# Patient Record
Sex: Male | Born: 1945 | Race: White | Hispanic: No | Marital: Married | Smoking: Never smoker
Health system: Southern US, Community
[De-identification: ages and names within clinical notes are randomized; demographics above are authoritative.]

## PROBLEM LIST (undated history)

## (undated) DIAGNOSIS — K644 Residual hemorrhoidal skin tags: Secondary | ICD-10-CM

## (undated) DIAGNOSIS — R194 Change in bowel habit: Secondary | ICD-10-CM

## (undated) DIAGNOSIS — M1990 Unspecified osteoarthritis, unspecified site: Secondary | ICD-10-CM

## (undated) DIAGNOSIS — K5730 Diverticulosis of large intestine without perforation or abscess without bleeding: Secondary | ICD-10-CM

## (undated) DIAGNOSIS — R011 Cardiac murmur, unspecified: Secondary | ICD-10-CM

## (undated) DIAGNOSIS — K219 Gastro-esophageal reflux disease without esophagitis: Secondary | ICD-10-CM

## (undated) DIAGNOSIS — J189 Pneumonia, unspecified organism: Secondary | ICD-10-CM

## (undated) DIAGNOSIS — R7303 Prediabetes: Secondary | ICD-10-CM

## (undated) DIAGNOSIS — I10 Essential (primary) hypertension: Secondary | ICD-10-CM

## (undated) DIAGNOSIS — N400 Enlarged prostate without lower urinary tract symptoms: Secondary | ICD-10-CM

## (undated) DIAGNOSIS — E785 Hyperlipidemia, unspecified: Secondary | ICD-10-CM

## (undated) DIAGNOSIS — C4490 Unspecified malignant neoplasm of skin, unspecified: Secondary | ICD-10-CM

## (undated) DIAGNOSIS — G20A1 Parkinson's disease without dyskinesia, without mention of fluctuations: Secondary | ICD-10-CM

## (undated) DIAGNOSIS — T8859XA Other complications of anesthesia, initial encounter: Secondary | ICD-10-CM

## (undated) DIAGNOSIS — Z87898 Personal history of other specified conditions: Secondary | ICD-10-CM

## (undated) HISTORY — DX: Unspecified malignant neoplasm of skin, unspecified: C44.90

## (undated) HISTORY — DX: Gastro-esophageal reflux disease without esophagitis: K21.9

## (undated) HISTORY — DX: Hyperlipidemia, unspecified: E78.5

## (undated) HISTORY — DX: Unspecified osteoarthritis, unspecified site: M19.90

## (undated) HISTORY — DX: Residual hemorrhoidal skin tags: K64.4

## (undated) HISTORY — DX: Personal history of other specified conditions: Z87.898

## (undated) HISTORY — PX: WISDOM TOOTH EXTRACTION: SHX21

## (undated) HISTORY — DX: Change in bowel habit: R19.4

## (undated) HISTORY — DX: Cardiac murmur, unspecified: R01.1

## (undated) HISTORY — PX: TONSILLECTOMY: SUR1361

## (undated) HISTORY — DX: Gilbert syndrome: E80.4

## (undated) HISTORY — DX: Diverticulosis of large intestine without perforation or abscess without bleeding: K57.30

## (undated) HISTORY — DX: Essential (primary) hypertension: I10

## (undated) HISTORY — DX: Benign prostatic hyperplasia without lower urinary tract symptoms: N40.0

---
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
## 1990-01-09 HISTORY — PX: LUMBAR LAMINECTOMY: SHX95

## 1998-10-09 ENCOUNTER — Encounter: Payer: Self-pay | Admitting: Internal Medicine

## 1998-10-09 ENCOUNTER — Ambulatory Visit (HOSPITAL_COMMUNITY): Admission: RE | Admit: 1998-10-09 | Discharge: 1998-10-09 | Payer: Self-pay | Admitting: Internal Medicine

## 1998-10-19 ENCOUNTER — Encounter: Payer: Self-pay | Admitting: Internal Medicine

## 1998-10-19 ENCOUNTER — Ambulatory Visit (HOSPITAL_COMMUNITY): Admission: RE | Admit: 1998-10-19 | Discharge: 1998-10-19 | Payer: Self-pay | Admitting: Internal Medicine

## 2004-01-10 HISTORY — PX: INGUINAL HERNIA REPAIR: SUR1180

## 2004-07-05 ENCOUNTER — Ambulatory Visit: Payer: Self-pay | Admitting: Internal Medicine

## 2004-07-08 ENCOUNTER — Ambulatory Visit: Payer: Self-pay | Admitting: Internal Medicine

## 2004-10-03 ENCOUNTER — Ambulatory Visit: Payer: Self-pay | Admitting: Internal Medicine

## 2004-12-29 ENCOUNTER — Ambulatory Visit: Payer: Self-pay | Admitting: Internal Medicine

## 2005-01-04 ENCOUNTER — Encounter: Payer: Self-pay | Admitting: Internal Medicine

## 2005-01-04 ENCOUNTER — Ambulatory Visit: Payer: Self-pay

## 2006-01-09 DIAGNOSIS — K5730 Diverticulosis of large intestine without perforation or abscess without bleeding: Secondary | ICD-10-CM

## 2006-01-09 HISTORY — PX: COLONOSCOPY: SHX174

## 2006-01-09 HISTORY — DX: Diverticulosis of large intestine without perforation or abscess without bleeding: K57.30

## 2006-07-03 ENCOUNTER — Ambulatory Visit: Payer: Self-pay | Admitting: Gastroenterology

## 2006-07-27 ENCOUNTER — Encounter: Payer: Self-pay | Admitting: Internal Medicine

## 2006-07-27 ENCOUNTER — Ambulatory Visit: Payer: Self-pay | Admitting: Gastroenterology

## 2006-07-27 DIAGNOSIS — K5730 Diverticulosis of large intestine without perforation or abscess without bleeding: Secondary | ICD-10-CM | POA: Insufficient documentation

## 2006-07-27 LAB — HM COLONOSCOPY

## 2006-08-13 ENCOUNTER — Ambulatory Visit: Payer: Self-pay | Admitting: Internal Medicine

## 2006-08-13 DIAGNOSIS — E785 Hyperlipidemia, unspecified: Secondary | ICD-10-CM | POA: Insufficient documentation

## 2006-08-21 ENCOUNTER — Encounter (INDEPENDENT_AMBULATORY_CARE_PROVIDER_SITE_OTHER): Payer: Self-pay | Admitting: *Deleted

## 2007-01-14 ENCOUNTER — Encounter: Payer: Self-pay | Admitting: Internal Medicine

## 2007-06-07 ENCOUNTER — Encounter: Payer: Self-pay | Admitting: Internal Medicine

## 2007-06-20 ENCOUNTER — Ambulatory Visit: Payer: Self-pay | Admitting: Internal Medicine

## 2007-07-05 ENCOUNTER — Ambulatory Visit: Payer: Self-pay | Admitting: Internal Medicine

## 2007-08-09 ENCOUNTER — Encounter: Payer: Self-pay | Admitting: Internal Medicine

## 2007-09-13 ENCOUNTER — Encounter: Payer: Self-pay | Admitting: Internal Medicine

## 2007-10-08 ENCOUNTER — Encounter: Payer: Self-pay | Admitting: Internal Medicine

## 2008-02-06 ENCOUNTER — Telehealth (INDEPENDENT_AMBULATORY_CARE_PROVIDER_SITE_OTHER): Payer: Self-pay | Admitting: *Deleted

## 2008-03-12 ENCOUNTER — Ambulatory Visit: Payer: Self-pay | Admitting: Internal Medicine

## 2008-04-29 ENCOUNTER — Encounter: Payer: Self-pay | Admitting: Internal Medicine

## 2008-05-29 ENCOUNTER — Encounter: Payer: Self-pay | Admitting: Internal Medicine

## 2008-06-15 ENCOUNTER — Telehealth (INDEPENDENT_AMBULATORY_CARE_PROVIDER_SITE_OTHER): Payer: Self-pay | Admitting: *Deleted

## 2008-12-02 ENCOUNTER — Ambulatory Visit: Payer: Self-pay | Admitting: Internal Medicine

## 2008-12-10 ENCOUNTER — Telehealth (INDEPENDENT_AMBULATORY_CARE_PROVIDER_SITE_OTHER): Payer: Self-pay | Admitting: *Deleted

## 2009-01-26 ENCOUNTER — Ambulatory Visit: Payer: Self-pay | Admitting: Internal Medicine

## 2009-01-26 DIAGNOSIS — R9431 Abnormal electrocardiogram [ECG] [EKG]: Secondary | ICD-10-CM | POA: Insufficient documentation

## 2009-01-26 DIAGNOSIS — Z85828 Personal history of other malignant neoplasm of skin: Secondary | ICD-10-CM | POA: Insufficient documentation

## 2009-01-26 DIAGNOSIS — E349 Endocrine disorder, unspecified: Secondary | ICD-10-CM | POA: Insufficient documentation

## 2009-01-26 DIAGNOSIS — Z8679 Personal history of other diseases of the circulatory system: Secondary | ICD-10-CM | POA: Insufficient documentation

## 2009-01-26 DIAGNOSIS — I10 Essential (primary) hypertension: Secondary | ICD-10-CM | POA: Insufficient documentation

## 2009-03-12 ENCOUNTER — Ambulatory Visit: Payer: Self-pay | Admitting: Internal Medicine

## 2009-06-29 ENCOUNTER — Telehealth (INDEPENDENT_AMBULATORY_CARE_PROVIDER_SITE_OTHER): Payer: Self-pay | Admitting: *Deleted

## 2009-08-25 ENCOUNTER — Encounter: Payer: Self-pay | Admitting: Internal Medicine

## 2010-02-06 LAB — CONVERTED CEMR LAB
ALT: 19 units/L (ref 0–53)
ALT: 21 units/L (ref 0–53)
AST: 25 units/L (ref 0–37)
AST: 28 units/L (ref 0–37)
Albumin: 4.3 g/dL (ref 3.5–5.2)
Albumin: 4.4 g/dL (ref 3.5–5.2)
Alkaline Phosphatase: 47 units/L (ref 39–117)
Alkaline Phosphatase: 56 units/L (ref 39–117)
BUN: 15 mg/dL (ref 6–23)
BUN: 20 mg/dL (ref 6–23)
Basophils Absolute: 0 10*3/uL (ref 0.0–0.1)
Basophils Absolute: 0 10*3/uL (ref 0.0–0.1)
Basophils Relative: 0.1 % (ref 0.0–3.0)
Basophils Relative: 0.7 % (ref 0.0–1.0)
Bilirubin, Direct: 0.1 mg/dL (ref 0.0–0.3)
Bilirubin, Direct: 0.2 mg/dL (ref 0.0–0.3)
CO2: 30 meq/L (ref 19–32)
CO2: 35 meq/L — ABNORMAL HIGH (ref 19–32)
Calcium: 9 mg/dL (ref 8.4–10.5)
Calcium: 9.1 mg/dL (ref 8.4–10.5)
Chloride: 103 meq/L (ref 96–112)
Chloride: 104 meq/L (ref 96–112)
Cholesterol: 164 mg/dL (ref 0–200)
Creatinine, Ser: 0.9 mg/dL (ref 0.4–1.5)
Creatinine, Ser: 1 mg/dL (ref 0.4–1.5)
Eosinophils Absolute: 0 10*3/uL (ref 0.0–0.7)
Eosinophils Absolute: 0.1 10*3/uL (ref 0.0–0.6)
Eosinophils Relative: 0.8 % (ref 0.0–5.0)
Eosinophils Relative: 3.1 % (ref 0.0–5.0)
GFR calc Af Amer: 110 mL/min
GFR calc non Af Amer: 79.69 mL/min (ref >60)
GFR calc non Af Amer: 91 mL/min
Glucose, Bld: 92 mg/dL (ref 70–99)
Glucose, Bld: 93 mg/dL (ref 70–99)
HCT: 40.1 % (ref 39.0–52.0)
HCT: 41.4 % (ref 39.0–52.0)
HDL: 44.3 mg/dL (ref >39.00)
Hemoglobin: 13.9 g/dL (ref 13.0–17.0)
Hemoglobin: 14.1 g/dL (ref 13.0–17.0)
LDL Cholesterol: 104 mg/dL — ABNORMAL HIGH (ref 0–99)
Lymphocytes Relative: 31.9 % (ref 12.0–46.0)
Lymphocytes Relative: 32.5 % (ref 12.0–46.0)
Lymphs Abs: 1.7 10*3/uL (ref 0.7–4.0)
MCHC: 33.6 g/dL (ref 30.0–36.0)
MCHC: 35.1 g/dL (ref 30.0–36.0)
MCV: 93 fL (ref 78.0–100.0)
MCV: 95.5 fL (ref 78.0–100.0)
Monocytes Absolute: 0.3 10*3/uL (ref 0.2–0.7)
Monocytes Absolute: 0.4 10*3/uL (ref 0.1–1.0)
Monocytes Relative: 6.4 % (ref 3.0–11.0)
Monocytes Relative: 6.9 % (ref 3.0–12.0)
Neutro Abs: 2.8 10*3/uL (ref 1.4–7.7)
Neutro Abs: 3.2 10*3/uL (ref 1.4–7.7)
Neutrophils Relative %: 57.3 % (ref 43.0–77.0)
Neutrophils Relative %: 60.3 % (ref 43.0–77.0)
Platelets: 221 10*3/uL (ref 150.0–400.0)
Platelets: 236 10*3/uL (ref 150–400)
Potassium: 4.2 meq/L (ref 3.5–5.1)
Potassium: 4.5 meq/L (ref 3.5–5.1)
RBC: 4.31 M/uL (ref 4.22–5.81)
RBC: 4.34 M/uL (ref 4.22–5.81)
RDW: 12.1 % (ref 11.5–14.6)
RDW: 12.3 % (ref 11.5–14.6)
Sodium: 141 meq/L (ref 135–145)
Sodium: 143 meq/L (ref 135–145)
TSH: 3.51 microintl units/mL (ref 0.35–5.50)
TSH: 4.16 microintl units/mL (ref 0.35–5.50)
Total Bilirubin: 1.6 mg/dL — ABNORMAL HIGH (ref 0.3–1.2)
Total Bilirubin: 1.9 mg/dL — ABNORMAL HIGH (ref 0.3–1.2)
Total CHOL/HDL Ratio: 4
Total Protein: 7.1 g/dL (ref 6.0–8.3)
Total Protein: 7.5 g/dL (ref 6.0–8.3)
Triglycerides: 77 mg/dL (ref 0.0–149.0)
VLDL: 15.4 mg/dL (ref 0.0–40.0)
WBC: 4.8 10*3/uL (ref 4.5–10.5)
WBC: 5.3 10*3/uL (ref 4.5–10.5)

## 2010-02-08 NOTE — Letter (Signed)
Summary: [facility]  [facility]   Imported By: Lanelle Bal 09/06/2009 12:49:35  _____________________________________________________________________  External Attachment:    Type:   Image     Comment:   External Document

## 2010-02-08 NOTE — Miscellaneous (Signed)
Summary: Directives and Consents  Directives and Consents   Imported By: Freddy Jaksch 08/14/2006 09:15:54  _____________________________________________________________________  External Attachment:    Type:   Image     Comment:   DIRECTIVES AND CONSENT

## 2010-02-08 NOTE — Assessment & Plan Note (Signed)
Summary: CPX/NS/KDC   Vital Signs:  Patient profile:   65 year old male Height:      70.75 inches Weight:      188 pounds BMI:     26.50 Temp:     98.4 degrees F oral Pulse rate:   67 / minute Resp:     14 per minute BP sitting:   120 / 82  (left arm) Cuff size:   large  Vitals Entered By: Shonna Chock (January 26, 2009 8:14 AM)  Comments REVIEWED MED LIST, PATIENT AGREED DOSE AND INSTRUCTION CORRECT    History of Present Illness: Douglas Johnston is here for a physical; he is asymptomatic. He received flu shot today.  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 melanoma R shoulder 1996, [place]; now seeing Dr Donzetta Starch  Past Surgical History: Lumbar laminectomy @  L4-5 Tonsillectomy ? ASD surgery  age 66 Inguinal herniorrhaphy Colonoscopy 2009, due 2019  Family History: Father: emphysema, smoker Mother: HTN , SBO Siblings: negative  Social History: Occupation:VP Firefighter Married Never Smoked Alcohol use-yes: socially Regular exercise-yes: CVE 60 min / week & weights  Review of Systems  The patient denies anorexia, fever, weight loss, weight gain, vision loss, decreased hearing, hoarseness, chest pain, syncope, dyspnea on exertion, peripheral edema, prolonged cough, headaches, hemoptysis, abdominal pain, melena, hematochezia, severe indigestion/heartburn, hematuria, incontinence, suspicious skin lesions, depression, unusual weight change, abnormal bleeding, enlarged lymph nodes, and  angioedema.    Physical Exam  General:  Well-developed,well-nourished,in no acute distress; alert,appropriate and cooperative throughout examination Head:  Normocephalic and atraumatic without obvious abnormalities. Eyes:  No corneal or conjunctival inflammation noted.  Perrla. Funduscopic exam benign, without hemorrhages, exudates or papilledema.  Ears:  External ear exam shows no significant lesions or deformities.  Otoscopic examination reveals clear canals, tympanic membranes are intact bilaterally without bulging, retraction, inflammation or discharge. Hearing is grossly normal bilaterally. Nose:  External nasal examination shows no deformity or inflammation. Nasal mucosa are pink and moist without lesions or exudates. Mouth:  Oral mucosa and oropharynx without lesions or exudates.  Teeth in good repair. Neck:  No deformities, masses, or tenderness noted.Minimal throid asymmetry Chest Wall:  OP scar inframammary area bilaterally Lungs:  Normal respiratory effort, chest expands symmetrically. Lungs are clear to auscultation, no crackles or wheezes. Heart:  regular rhythm, no murmur, no gallop, no rub, no JVD, no HJR, and bradycardia.   Abdomen:  Bowel sounds positive,abdomen soft and non-tender without masses, organomegaly or hernias noted. Genitalia:  Dr Annabell Howells Prostate:  Dr Annabell Howells Msk:  No deformity or scoliosis noted of thoracic or lumbar spine.   Pulses:  R and L carotid,radial,dorsalis pedis and posterior tibial pulses are full and equal bilaterally Extremities:  No clubbing, cyanosis, edema, or deformity noted with normal full range of motion of all joints.   Neurologic:  alert & oriented X3 and DTRs symmetrical and 0-1/2+ Skin:  Op scar R post shoulder Cervical Nodes:  No lymphadenopathy noted Axillary Nodes:  No palpable lymphadenopathy Psych:  memory intact for recent and remote, normally interactive, and good eye contact.  Impression & Recommendations:  Problem # 1:   ROUTINE GENERAL MEDICAL EXAM@HEALTH  CARE FACL (ICD-V70.0)  Orders: EKG w/ Interpretation (93000) Venipuncture (16109) TLB-Lipid Panel (80061-LIPID) TLB-BMP (Basic Metabolic Panel-BMET) (80048-METABOL) TLB-CBC Platelet - w/Differential (85025-CBCD) TLB-Hepatic/Liver Function Pnl (80076-HEPATIC) TLB-TSH (Thyroid Stimulating Hormone) (60454-UJW) Radiology Referral (Radiology)  Problem # 2:  HYPERTENSION (ICD-401.9)  controlled His updated medication list for this problem includes:    Hydrochlorothiazide 25 Mg Tabs (Hydrochlorothiazide) .Marland Kitchen... 1/2 by mouth qd  Orders: Venipuncture (11914)  Problem # 3:  HYPERLIPIDEMIA NEC/NOS (ICD-272.4)  Orders: Venipuncture (78295) TLB-Lipid Panel (80061-LIPID)  Problem # 4:  TESTICULAR HYPOFUNCTION (ICD-257.2)  as per Dr Annabell Howells  Orders: Venipuncture [phone]) Radiology Referral (Radiology)  Problem # 5:  SKIN CANCER, HX OF (ICD-V10.83) ? PMH of melanoma; as per Dr Donzetta Starch  Problem # 6:  ELECTROCARDIOGRAM, ABNORMAL (ICD-794.31)  Problem # 7:  ATRIAL SEPTAL DEFECT, HX OF (ICD-V12.59)  Orders: EKG w/ Interpretation (93000)  Complete Medication List: 1)  Hydrochlorothiazide 25 Mg Tabs (Hydrochlorothiazide) .... 1/2 by mouth qd 2)  Androgel 25 Mg/2.5gm Gel (Testosterone) 3)  Multivitamins Tabs (Multiple vitamin) .Marland Kitchen.. 1 by mouth once daily  Patient Instructions: 1)  Your LDL goal = < 100.  Appended Document: CPX/NS/KDC

## 2010-02-08 NOTE — Progress Notes (Signed)
Summary: REFILL REQUEST  Phone Note Refill Request Call back at [phone] Message from:  Pharmacy on June 29, 2009 8:21 AM  Refills Requested: Medication #1:  HYDROCHLOROTHIAZIDE 25 MG  TABS 1/2 by mouth qd   Dosage confirmed as above?Dosage Confirmed   Supply Requested: 1 month   Last Refilled: 05/24/2009 [facility]  Next Appointment Scheduled: NONE Initial call taken by: Lavell Islam,  June 29, 2009 8:21 AM    Prescriptions: HYDROCHLOROTHIAZIDE 25 MG  TABS (HYDROCHLOROTHIAZIDE) 1/2 by mouth qd  #15 Each x 5   Entered by:   Shonna Chock   Authorized by:   Marga Melnick MD   Signed by:   Shonna Chock on 06/29/2009   Method used:   Electronically to        [facility]* (retail)       [address]       [place], [place]  [id number]       Ph: [phone]       Fax: [phone]   RxID:   [id number]

## 2010-02-08 NOTE — Consult Note (Signed)
Summary: [facility]  [facility]   Imported By: Lanelle Bal 08/19/2007 10:59:28  _____________________________________________________________________  External Attachment:    Type:   Image     Comment:   External Document

## 2010-02-08 NOTE — Progress Notes (Signed)
Summary: [facility]  Phone Note Refill Request   Refills Requested: Medication #1:  HYDROCHLOROTHIAZIDE 25 MG  TABS 1/2 by mouth qd [facility] --[phone] filled--1.7.2010  Initial call taken by: Freddy Jaksch,  February 06, 2008 8:36 AM      Prescriptions: HYDROCHLOROTHIAZIDE 25 MG  TABS (HYDROCHLOROTHIAZIDE) 1/2 by mouth qd  #45 x 0   Entered by:   Shary Decamp   Authorized by:   Marga Melnick MD   Signed by:   Shary Decamp on 02/06/2008   Method used:   Electronically to        [facility]* (retail)       [address]       [place], [place]  [id number]       Ph: [phone]       Fax: [phone]   RxID:   [id number]

## 2010-02-08 NOTE — Assessment & Plan Note (Signed)
Summary: cough,fatigue,cbs   Vital Signs:  Patient Profile:   65 Years Old Male Weight:      187.6 pounds O2 Sat:      95 % O2 treatment:    Room Air Temp:     07.5 degrees F oral Pulse rate:   70 / minute Resp:     16 per minute BP sitting:   120 / 80  (left arm) Cuff size:   large  Vitals Entered By: Shonna Chock (March 12, 2008 11:01 AM)                 Chief Complaint:  HEAD COLD: COUGH and CONGESTION AND FATIGUE. ONSET: MONDAY.  History of Present Illness: Onset 03/09/2008 as dry nose  followed by itchy eyes & rhinitis. Rx: Claritin w/o benefit    Current Allergies (reviewed today): No known allergies      Review of Systems  General      Denies chills, fever, and sweats.  ENT      Complains of nasal congestion and sinus pressure.      No frontal headache ,facial pain or purulence  Resp      Denies cough and sputum productive.  Allergy      Complains of itching eyes and sneezing.   Physical Exam  General:     well-nourished,in no acute distress; alert,appropriate and cooperative throughout examination Ears:     External ear exam shows no significant lesions or deformities.  Otoscopic examination reveals clear canals, tympanic membranes are intact bilaterally without bulging, retraction, inflammation or discharge. Hearing is grossly normal bilaterally. Nose:     External nasal examination shows no deformity or inflammation. Nasal mucosa are pink and moist without lesions or exudates. Mouth:     Oral mucosa and oropharynx without lesions or exudates.  Teeth in good repair. Minimal pharyngeal erythema.   Lungs:     Normal respiratory effort, chest expands symmetrically. Lungs are clear to auscultation, no crackles or wheezes. Cervical Nodes:     No lymphadenopathy noted Axillary Nodes:     No palpable lymphadenopathy    Impression & Recommendations:  Problem # 1:  RHINITIS (ICD-477.9)  His updated medication list for this problem includes:    Nasonex 50 Mcg/act Susp (Mometasone furoate) .Marland Kitchen... 1 spray each nostril two times a day    Xyzal 5 Mg Tabs (Levocetirizine dihydrochloride) .Marland Kitchen... 1 at bedtime   Complete Medication List: 1)  Hydrochlorothiazide 25 Mg Tabs (Hydrochlorothiazide) .... 1/2 by mouth qd 2)  Vitamin C  3)  Vitamin E  4)  Omega 3 Fatty Acids  5)  B Complex  6)  Androgel 25 Mg/2.5gm Gel (Testosterone) 7)  Singulair 10 Mg Tabs (Montelukast sodium) .Marland Kitchen.. 1 once daily in am 8)  Nasonex 50 Mcg/act Susp (Mometasone furoate) .Marland Kitchen.. 1 spray each nostril two times a day 9)  Xyzal 5 Mg Tabs (Levocetirizine dihydrochloride) .Marland Kitchen.. 1 at bedtime   Patient Instructions: 1)  Neti pot once daily until sinuses clear. 2)  Drink as much fluid as you can tolerate for the next few days. Samples as directed; report "pain,pus & fever"   Prescriptions: XYZAL 5 MG TABS (LEVOCETIRIZINE DIHYDROCHLORIDE) 1 at bedtime  #7 x 0   Entered and Authorized by:   Marga Melnick MD   Signed by:   Marga Melnick MD on 03/12/2008   Method used:   Print then Give to Patient   RxID:   [id number] NASONEX 50 MCG/ACT SUSP (MOMETASONE FUROATE) 1 spray each  nostril two times a day  #1 x 5   Entered and Authorized by:   Marga Melnick MD   Signed by:   Marga Melnick MD on 03/12/2008   Method used:   Print then Give to Patient   RxID:   [id number] SINGULAIR 10 MG TABS (MONTELUKAST SODIUM) 1 once daily in am  #14 x 0   Entered and Authorized by:   Marga Melnick MD   Signed by:   Marga Melnick MD on 03/12/2008   Method used:   Print then Give to Patient   RxID:   [id number]

## 2010-02-08 NOTE — Miscellaneous (Signed)
Summary: BONE DENSITY  Clinical Lists Changes  Orders: Added new Test order of T-Bone Densitometry (77080) - Signed Added new Test order of T-Lumbar Vertebral Assessment (77082) - Signed 

## 2010-02-14 ENCOUNTER — Other Ambulatory Visit: Payer: Self-pay | Admitting: Internal Medicine

## 2010-02-14 ENCOUNTER — Encounter (INDEPENDENT_AMBULATORY_CARE_PROVIDER_SITE_OTHER): Payer: Self-pay | Admitting: Internal Medicine

## 2010-02-14 ENCOUNTER — Encounter: Payer: Self-pay | Admitting: Internal Medicine

## 2010-02-14 DIAGNOSIS — E785 Hyperlipidemia, unspecified: Secondary | ICD-10-CM

## 2010-02-14 DIAGNOSIS — Z0000 Encounter for general adult medical examination without abnormal findings: Secondary | ICD-10-CM

## 2010-02-14 DIAGNOSIS — Z23 Encounter for immunization: Secondary | ICD-10-CM

## 2010-02-14 DIAGNOSIS — Z8679 Personal history of other diseases of the circulatory system: Secondary | ICD-10-CM

## 2010-02-14 DIAGNOSIS — Z136 Encounter for screening for cardiovascular disorders: Secondary | ICD-10-CM

## 2010-02-14 DIAGNOSIS — I10 Essential (primary) hypertension: Secondary | ICD-10-CM

## 2010-02-14 DIAGNOSIS — R9431 Abnormal electrocardiogram [ECG] [EKG]: Secondary | ICD-10-CM

## 2010-02-14 LAB — HEPATIC FUNCTION PANEL
ALT: 27 U/L (ref 0–53)
AST: 28 U/L (ref 0–37)
Albumin: 4.4 g/dL (ref 3.5–5.2)
Alkaline Phosphatase: 60 U/L (ref 39–117)
Bilirubin, Direct: 0.2 mg/dL (ref 0.0–0.3)
Total Bilirubin: 1.1 mg/dL (ref 0.3–1.2)
Total Protein: 7.1 g/dL (ref 6.0–8.3)

## 2010-02-14 LAB — BASIC METABOLIC PANEL
BUN: 20 mg/dL (ref 6–23)
CO2: 29 mEq/L (ref 19–32)
Calcium: 8.8 mg/dL (ref 8.4–10.5)
Chloride: 102 mEq/L (ref 96–112)
Creatinine, Ser: 0.9 mg/dL (ref 0.4–1.5)
GFR: 85.31 mL/min (ref >60.00)
Glucose, Bld: 73 mg/dL (ref 70–99)
Potassium: 4.1 mEq/L (ref 3.5–5.1)
Sodium: 140 mEq/L (ref 135–145)

## 2010-02-14 LAB — CBC WITH DIFFERENTIAL/PLATELET
Basophils Absolute: 0 10*3/uL (ref 0.0–0.1)
Basophils Relative: 0.2 % (ref 0.0–3.0)
Eosinophils Absolute: 0 10*3/uL (ref 0.0–0.7)
Eosinophils Relative: 0.2 % (ref 0.0–5.0)
HCT: 40.3 % (ref 39.0–52.0)
Hemoglobin: 14 g/dL (ref 13.0–17.0)
Lymphocytes Relative: 13.9 % (ref 12.0–46.0)
Lymphs Abs: 1.3 10*3/uL (ref 0.7–4.0)
MCHC: 34.7 g/dL (ref 30.0–36.0)
MCV: 93.7 fl (ref 78.0–100.0)
Monocytes Absolute: 0.5 10*3/uL (ref 0.1–1.0)
Monocytes Relative: 5.5 % (ref 3.0–12.0)
Neutro Abs: 7.7 10*3/uL (ref 1.4–7.7)
Neutrophils Relative %: 80.2 % — ABNORMAL HIGH (ref 43.0–77.0)
Platelets: 249 10*3/uL (ref 150.0–400.0)
RBC: 4.3 Mil/uL (ref 4.22–5.81)
RDW: 12.9 % (ref 11.5–14.6)
WBC: 9.6 10*3/uL (ref 4.5–10.5)

## 2010-02-14 LAB — LIPID PANEL
Cholesterol: 150 mg/dL (ref 0–200)
HDL: 37.8 mg/dL — ABNORMAL LOW (ref >39.00)
LDL Cholesterol: 100 mg/dL — ABNORMAL HIGH (ref 0–99)
Total CHOL/HDL Ratio: 4
Triglycerides: 59 mg/dL (ref 0.0–149.0)
VLDL: 11.8 mg/dL (ref 0.0–40.0)

## 2010-02-14 LAB — CONVERTED CEMR LAB
Cholesterol, target level: 200 mg/dL
HDL goal, serum: 40 mg/dL
LDL Goal: 130 mg/dL

## 2010-02-14 LAB — TSH: TSH: 2.58 u[IU]/mL (ref 0.35–5.50)

## 2010-02-24 NOTE — Assessment & Plan Note (Signed)
Summary: cpx & lab/cbs   Vital Signs:  Patient profile:   65 year old male Height:      70.5 inches Weight:      188.4 pounds BMI:     26.75 Temp:     98.9 degrees F oral Pulse rate:   64 / minute Resp:     14 per minute BP sitting:   108 / 80  (left arm) Cuff size:   large  Vitals Entered By: Shonna Chock CMA (February 14, 2010 9:32 AM)  CC: CPX with fasting labs , General Medical Evaluation, Lipid Management  Vision Screening:Left eye with correction: 20 / 25 Right eye with correction: 20 / 25 Both eyes with correction: 20 / 25  Color vision testing: normal      Vision Entered By: Shonna Chock CMA (February 14, 2010 9:39 AM)   CC:  CPX with fasting labs , General Medical Evaluation, and Lipid Management.  History of Present Illness:    Douglas Johnston is here for a physical; he is asymptomatic. He is still employed . Hypertension Follow-Up: The patient denies lightheadedness, urinary frequency, headaches, edema, and fatigue.  The patient denies the following associated symptoms: chest pain, chest pressure, exercise intolerance, dyspnea, palpitations, and syncope.  Compliance with medications (by patient report) has been near 100%.  The patient reports that dietary compliance has been good.  Adjunctive measures currently used by the patient include salt restriction. BP @ home 130/80-90.   Lipid Management History:      Positive NCEP/ATP III risk factors include male age 63 years old or older and hypertension.  Negative NCEP/ATP III risk factors include non-diabetic, no family history for ischemic heart disease, non-tobacco-user status, no ASHD (atherosclerotic heart disease), no prior stroke/TIA, no peripheral vascular disease, and no history of aortic aneurysm.     Current Medications (verified): 1)  Hydrochlorothiazide 25 Mg  Tabs (Hydrochlorothiazide) .... 1/2 By Mouth Qd 2)  Androgel 25 Mg/2.5gm  Gel (Testosterone) 3)  Multivitamins  Tabs (Multiple Vitamin) .Marland Kitchen.. 1 By  Mouth Once Daily 4)  Vitamin C 500 Mg Tabs (Ascorbic Acid) .Marland Kitchen.. 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 [place] Study LDL goal = < 130. Hypertension Skin cancer,PMH  of,Basal Cell X 3; ? melanoma R shoulder 1996, [place]; now seeing Dr Donzetta Starch Abnormal EKG ; neg Nucear Stress Test  12/2004  Past Surgical History: Lumbar laminectomy @  L4-5 Tonsillectomy ? ASD surgery  @  age 39 Inguinal herniorrhaphy Colonoscopy 2009, due 2019  Family History: Father: emphysema, smoker Mother: HTN , Small Bowel Obstruction Siblings: negative; no  FH CVA , MI or DM  Social History: Occupation:[employer] Married Never Smoked Alcohol use-yes: socially Regular exercise-yes: CVE  & weights 2.5 - 3 hrs/ week  Review of Systems  The patient denies anorexia, fever, weight loss, weight gain, vision loss, decreased hearing, hoarseness, prolonged cough, hemoptysis, abdominal pain, melena, hematochezia, severe indigestion/heartburn, suspicious skin lesions, depression, unusual weight change, abnormal bleeding, enlarged lymph nodes, and angioedema.         Dr Annabell Howells seen annually.  Physical Exam  General:  Well-developed,well-nourished;alert,appropriate and cooperative throughout examination Head:  Normocephalic and atraumatic without obvious abnormalities.  Eyes:  No corneal or conjunctival inflammation noted. Perrla. Funduscopic exam benign, without hemorrhages, exudates or papilledema. Ears:  External ear exam shows no significant lesions or  deformities.  Otoscopic examination reveals clear canals, tympanic membranes are intact bilaterally without bulging, retraction, inflammation or discharge. Hearing is grossly normal bilaterally. Some wax bilaterally Nose:  External nasal examination shows no deformity or inflammation. Nasal mucosa are pink and moist without lesions or exudates. Mouth:  Oral mucosa and oropharynx without lesions or exudates.  Teeth in good repair. Neck:  No deformities, masses, or tenderness noted. Lungs:  Normal respiratory effort, chest expands symmetrically. Lungs are clear to auscultation, no crackles or wheezes. Heart:  regular rhythm, no murmur, no gallop, no rub, no JVD, no HJR, and bradycardia.   Abdomen:  Bowel sounds positive,abdomen soft and non-tender without masses, organomegaly or hernias noted. Genitalia:  Dr Annabell Howells Msk:  No deformity or scoliosis noted of thoracic or lumbar spine but asymmetry of thoracic spine  .   Pulses:  R and L carotid,radial,dorsalis pedis and posterior tibial pulses are full and equal bilaterally Extremities:  No clubbing, cyanosis, edema, or deformity noted with normal full range of motion of all joints.   Neurologic:  alert & oriented X3, strength normal in all extremities, and DTRs symmetrical and normal.   Skin:  Intact without suspicious lesions or rashes Cervical Nodes:  No lymphadenopathy noted Axillary Nodes:  No palpable lymphadenopathy Psych:  memory intact for recent and remote, normally interactive, and good eye contact.     Impression & Recommendations:  Problem # 1:  PREVENTIVE HEALTH CARE (ICD-V70.0)  Orders: EKG w/ Interpretation (93000) Venipuncture (16109) TLB-Lipid Panel (80061-LIPID) TLB-BMP (Basic Metabolic Panel-BMET) (80048-METABOL) TLB-CBC Platelet - w/Differential (85025-CBCD) TLB-Hepatic/Liver Function Pnl (80076-HEPATIC) TLB-TSH (Thyroid Stimulating Hormone) (84443-TSH)  Problem # 2:  ELECTROCARDIOGRAM, ABNORMAL (ICD-794.31) serially  improved T voltage in V leads  Problem # 3:  HYPERTENSION (ICD-401.9) controlled His updated medication list for this problem includes:    Hydrochlorothiazide 25 Mg Tabs (Hydrochlorothiazide) .Marland Kitchen... 1/2 by mouth qd  Problem # 4:  ATRIAL SEPTAL DEFECT, HX OF (ICD-V12.59)  Problem # 5:  HYPERLIPIDEMIA NEC/NOS (ICD-272.4)  Complete Medication List: 1)  Hydrochlorothiazide 25 Mg Tabs (Hydrochlorothiazide) .... 1/2 by mouth qd 2)  Androgel 25 Mg/2.5gm Gel (Testosterone) 3)  Multivitamins Tabs (Multiple vitamin) .Marland Kitchen.. 1 by mouth once daily 4)  Vitamin C 500 Mg Tabs (Ascorbic acid) .Marland Kitchen.. 1 by mouth once daily  Other Orders: Tdap => 66yrs IM (60454) Admin 1st Vaccine (09811)  Lipid Assessment/Plan:      Based on NCEP/ATP III, the patient's risk factor category is "2 or more risk factors and a calculated 10 year CAD risk of < 20%".  The patient's lipid goals are as follows: Total cholesterol goal is 200; LDL cholesterol goal is 130; HDL cholesterol goal is 40; Triglyceride goal is 150.    Patient Instructions: 1)  It is important that you exercise regularly at least 20 minutes 5 times a week. If you develop chest pain, have severe difficulty breathing, or feel very tired , stop exercising immediately and seek medical attention. 2)  Take an 81 mg coated  Aspirin every day. 3)  Check your Blood Pressure regularly. If it is above: 135/85 ON AVERAGE you should make an appointment.   Orders Added: 1)  Tdap => 67yrs IM [90715] 2)  Admin 1st Vaccine [90471] 3)  Est. Patient 65& > [99397] 4)  EKG w/ Interpretation [93000] 5)  Venipuncture [36415] 6)  TLB-Lipid Panel [80061-LIPID] 7)  TLB-BMP (Basic Metabolic Panel-BMET) [80048-METABOL] 8)  TLB-CBC Platelet - w/Differential [85025-CBCD] 9)  TLB-Hepatic/Liver Function Pnl [80076-HEPATIC] 10)  TLB-TSH (Thyroid Stimulating Hormone) [30865-HQI]   Immunizations Administered:  Tetanus Vaccine:    Vaccine Type: Tdap    Site: right deltoid    Mfr:  GlaxoSmithKline    Dose: 0.5 ml    Route: IM    Given by: Shonna Chock CMA    Exp. Date: 10/29/2011    Lot #: [id number]    VIS given: 11/27/07 version given February 14, 2010.   Immunizations Administered:  Tetanus Vaccine:    Vaccine Type: Tdap    Site: right deltoid    Mfr: GlaxoSmithKline    Dose: 0.5 ml    Route: IM    Given by: Shonna Chock CMA    Exp. Date: 10/29/2011    Lot #: [id number]    VIS given: 11/27/07 version given February 14, 2010.    Family History:    Father: emphysema, smoker    Mother: HTN , Small Bowel Obstruction    Siblings: negative; no FH CVA , MI or DM  Social History:    Occupation:[employer]    Married    Never Smoked    Alcohol use-yes: socially    Regular exercise-yes: CVE  & weights 2.5 - 3 hrs/ week    Appended Document: cpx & lab/cbs

## 2010-04-04 ENCOUNTER — Other Ambulatory Visit: Payer: Self-pay | Admitting: Internal Medicine

## 2010-05-02 ENCOUNTER — Other Ambulatory Visit: Payer: Self-pay | Admitting: *Deleted

## 2010-05-02 MED ORDER — HYDROCHLOROTHIAZIDE 25 MG PO TABS: ORAL_TABLET | ORAL | Status: DC

## 2010-05-24 NOTE — Assessment & Plan Note (Signed)
[facility]                         GASTROENTEROLOGY OFFICE NOTE   IAM, LIPSON                    MRN:          [id number]  DATE:07/03/2006                            DOB:          Nov 20, 1945    Mr. Bracknell is a 65 year old white male who has had some rectal pain  over the last couple of weeks without rectal bleeding, abdominal pain,  or other GI complaints.  I previously saw him November 01 with rectal  bleeding and he underwent colonoscopy at that time which was  unremarkable except for external hemorrhoids.   The patient says he has regular bowel movements, he does not strain.  He  has had no bleeding, or rectal pain.  He occasionally has some acid  reflux for which he takes a p.r.n. antacid.  Otherwise he is in good  health except for some mild hypertension, for which he takes  hydrochlorothiazide.  He also is on a variety of multivitamins and  AndroGel 2.5 __________ a day.  He has had previous repair of a VSD at  age 74.   He is a healthy appearing white male in no distress, appearing younger  than his stated age.  He is 6 feet tall and weighs 186 pounds.  Blood pressure 116/68 and  pulse is 64 and regular.  Could not appreciate stigmata of chronic liver  disease.  His abdominal exam was unremarkable.  Inspection of rectum showed partially thrombosed right inferior-  posterior external hemorrhoid.  Rectal exam was not performed otherwise.   ASSESSMENT:  This patient has recurrent hemorrhoids and I am surprised  he does not have more rectal pain currently with a partially thrombosed  hemorrhoid.   RECOMMENDATIONS:  1. Sitz baths twice a day with local AnaMantle cream.  2. Daily Benefiber with juice and liberal p.o. fluids.  3. Outpatient colonoscopy in several weeks time.  4. The patient may need referral for hemorrhoidectomy, although I do      not think he has had enough difficulty at this time to consider      that course  of action unless his problems worsen.  I did give him      some patient information today concerning hemorrhoids and their      management.     Vania Rea. Jarold Motto, MD, Caleen Essex, FAGA  Electronically Signed    DRP/MedQ  DD: 07/03/2006  DT: 07/03/2006  Job #: [id number]   cc:   Dr. Alwyn Ren

## 2010-09-22 ENCOUNTER — Other Ambulatory Visit: Payer: Self-pay | Admitting: Internal Medicine

## 2010-10-17 ENCOUNTER — Encounter: Payer: Self-pay | Admitting: Sports Medicine

## 2010-10-17 ENCOUNTER — Ambulatory Visit (INDEPENDENT_AMBULATORY_CARE_PROVIDER_SITE_OTHER): Payer: 59 | Admitting: Sports Medicine

## 2010-10-17 VITALS — BP 148/91 | HR 66 | Ht 72.0 in | Wt 185.0 lb

## 2010-10-17 DIAGNOSIS — M19019 Primary osteoarthritis, unspecified shoulder: Secondary | ICD-10-CM

## 2010-10-17 MED ORDER — MELOXICAM 7.5 MG PO TABS: ORAL_TABLET | ORAL | Status: DC

## 2010-10-17 NOTE — Progress Notes (Signed)
  Subjective:    Patient ID: Douglas Johnston, male    DOB: 1945/10/11, 65 y.o.   MRN: [id number]  HPI 65 y/o WM with PMHx HTN and AR. C/o L shoulder pain x 4 years progressively worsening. Localized to anteriolateral shoulder with burning type of pain. Denies any injury, dislocation or repetitive overhead movement. 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 Has been seen by md years ago and told arthritis. Had one injection with mild short term alleviation. Does notice worsening with working outdoors, notices weakness then burning pain. Also worse at night, doesn't wake him from sleep. Some improvement with aleve. Admits to associated click/grinding. Denies numbness/tingling or pain radiating down L arm.    Review of Systems     Objective:   Physical Exam  Constitutional: He appears well-developed and well-nourished.  Neck: Neck supple.       No cervical spine tenderness  Cardiovascular: Intact distal pulses.   Pulmonary/Chest: Effort normal.  Neurological:       Sensation to touch intact   Shoulder exam: no asymmetry/swelling/erythema. nontender clavicle, AC joint, ant/post/lateral GH joint. Decreased L passive ROM to 160 flexion and 140 abduction. Decreased internal rotation on L by 4 inches on back scratch. Anterior pain with crossover test. Neg neers and hawkins. Neg yergason's and speeds.       Assessment & Plan:  * L shoulder pain 2/2 to [facility] joint arthritis and GH DJD -counseled on mechanism of arthritic changes -recommended home exercise program and discussed appropriate weight lifting movements to continue. Avoid loading AC joint. Recommended ROM exercises including pulley system to assist into full flexion. -discussed benefits of Devil's Claw supplement and Rx for mobic given -RTC if no improvement in next 2-3 months

## 2010-10-17 NOTE — Assessment & Plan Note (Signed)
Given advice about modified weightlifting as well as consistent rehabilitation exercises to try to lessen symptoms

## 2010-11-11 ENCOUNTER — Telehealth: Payer: Self-pay | Admitting: Internal Medicine

## 2010-11-11 NOTE — Telephone Encounter (Signed)
Call placed to patient at [phone], patients wife stated that he was seen by Dermatologist on Tuesday for  Skin tag removal. She stated the patient now has rash that is spreading on his body. She was asked if patient has had any signs of breathing difficulty and  stated that he has not. He has been using Allegra-Sarno cream for rashes and sunburns, and there was some relief. She was advised to have patient contact dermatology. She stated they have a call out to them, but has not heard back from there office.   Patients wife was advised to have patient seen at urgent care or ER for evaluation. She stated she will attempt to contact dermatology , and if there is no success, she will have patient seen in ER/urgent care.

## 2010-11-15 ENCOUNTER — Ambulatory Visit (INDEPENDENT_AMBULATORY_CARE_PROVIDER_SITE_OTHER): Payer: 59 | Admitting: Internal Medicine

## 2010-11-15 ENCOUNTER — Encounter: Payer: Self-pay | Admitting: Internal Medicine

## 2010-11-15 DIAGNOSIS — L509 Urticaria, unspecified: Secondary | ICD-10-CM

## 2010-11-15 MED ORDER — HYDROXYZINE HCL 25 MG PO TABS: 25.0000 mg | ORAL_TABLET | Freq: Three times a day (TID) | ORAL | Status: AC | PRN

## 2010-11-15 MED ORDER — RANITIDINE HCL 150 MG PO TABS: 150.0000 mg | ORAL_TABLET | Freq: Two times a day (BID) | ORAL | Status: DC

## 2010-11-15 NOTE — Patient Instructions (Signed)
Go to Web MD for information on Hives or Urticaria.

## 2010-11-15 NOTE — Progress Notes (Signed)
  Subjective:    Patient ID: Douglas Johnston, male    DOB: 1945-06-09, 65 y.o.   MRN: [id number]  HPI RASH: Onset: 11/1 as rash on inner thighs & waist Progression:11/2 on arms, thorax . Note : these resolved by UC visit  Course: chills 11/2 Self-treated with: anti-itch cream with some benefit MD F/U : hives diagnosed @ UC Sat 11/3 ; Steroids burst  Rxed           History Pruritis: yes,   Tenderness: no  New medications/antibiotics: no except injections @ Derm 10/29  Tick/insect/pet exposure: no  Recent travel: no  New detergent, new clothing, or other topical exposure: no   Red Flags Feeling ill: yes, only 11/2 due to nausea, lightheadedness  Fever: no  Mouth lesions: no  Facial/tongue swelling/difficulty breathing:  no     Review of Systems slight residual swelling of ankles     Objective:   Physical Exam General appearance is of good health and nourishment; no acute distress or increased work of breathing is present.  No  lymphadenopathy about the head, neck, or axilla noted.   Eyes: No conjunctival inflammation or lid edema is present.   Nose:  External nasal examination shows no deformity or inflammation. Nasal mucosa are pink and moist without lesions or exudates. No septal dislocation or dislocation.No obstruction to airflow.   Oral exam: Dental hygiene is good; lips and gums are healthy appearing.There is no oropharyngeal erythema or exudate noted.    Heart:  Normal rate and regular rhythm. S1 and S2 normal without gallop,  click, rub or other extra sounds. Grade 1/2 systolic murmur  Lungs:Chest clear to auscultation; no wheezes, rhonchi,rales ,or rubs present.No increased work of breathing.    Extremities:  No cyanosis or clubbing  noted . Trace edema   Skin: Warm & dry ; minor Dermatographia          Assessment & Plan:  #1 urticaria, essentially resolved Plan: See orders and recommendations

## 2010-12-19 ENCOUNTER — Encounter: Payer: Self-pay | Admitting: Internal Medicine

## 2010-12-20 ENCOUNTER — Encounter: Payer: Self-pay | Admitting: Internal Medicine

## 2010-12-20 ENCOUNTER — Ambulatory Visit (INDEPENDENT_AMBULATORY_CARE_PROVIDER_SITE_OTHER): Payer: 59 | Admitting: Internal Medicine

## 2010-12-20 VITALS — BP 124/88 | HR 70 | Temp 98.6°F | Wt 188.6 lb

## 2010-12-20 DIAGNOSIS — R059 Cough, unspecified: Secondary | ICD-10-CM

## 2010-12-20 DIAGNOSIS — R05 Cough: Secondary | ICD-10-CM

## 2010-12-20 DIAGNOSIS — L609 Nail disorder, unspecified: Secondary | ICD-10-CM

## 2010-12-20 MED ORDER — AZITHROMYCIN 250 MG PO TABS: ORAL_TABLET | ORAL | Status: AC

## 2010-12-20 NOTE — Progress Notes (Signed)
  Subjective:    Patient ID: Douglas Johnston, male    DOB: 06-06-1945, 65 y.o.   MRN: [id number]  HPI Cough Onset:12/7 as dry cough Trigger: none Course:increasing in severity but still dry Treatment/efficacy:fluids, lozenges with some benefit Associated Symptoms / Signs: URI  Symptoms: facial pain, frontal headaches,nasal purulence,dental pain , ST: no Extrinsic symptoms:itchy eyes, sneezing:no  Infectious symptoms :fever,chills, sweats, purulent secretions : no Chest symptoms: pleuritic pain, sputum production, hemoptysis,dyspnea,wheezing:no GI symptoms: Dyspepsia,dysphagia, reflux:no; on Ranitidine twice a day Occupational/environmental exposures:no Smoking:never ACE inhibitor:no Past medical history/family history pulmonary disease: Father had emphysema   Review of Systems     Objective:   Physical Exam  General appearance is of good health and nourishment; no acute distress or increased work of breathing is present.  No  lymphadenopathy about the head, neck, or axilla noted. An epidermal inclusion cyst is noted in the right axilla  Eyes: No conjunctival inflammation or lid edema is present.  Ears:  External ear exam shows no significant lesions or deformities.  Otoscopic examination reveals clear canals, tympanic membranes are intact bilaterally without bulging, retraction, inflammation or discharge.Minimal wax bilaterally  Nose:  External nasal examination shows no deformity or inflammation. Nasal mucosa are pink and moist without lesions or exudates. No septal dislocation .No obstruction to airflow.   Oral exam: Dental hygiene is good; lips and gums are healthy appearing.There is no oropharyngeal erythema or exudate noted.      Heart:  Normal rate and regular rhythm. S1 and S2 normal without gallop, murmur, click, rub or other extra sounds.   Lungs:Chest : Very faint rales suggested in the right upper lobe posteriorly. No wheezes, rhonchi ,or rubs present.No increased  work of breathing.    Extremities:  No cyanosis, edema, or clubbing  noted. There are vertical ecchymotic type changes in both great toenails. There is no classic fungal nail change seen.   Skin: Warm & dry .          Assessment & Plan:  #1 cough, nonproductive. Questionable rales on physical exam in RUL; R/O pneumonitis.  #2 reflux, asymptomatic  #3 nail changes wears which are suggestive of ecchymosis from trauma ( he plays racquetball) rather than fungus.  Plan: #1 Course of Zithromax  #2 continue ranitidine twice a day; Atrovent reflux measures  #3 use a hairdryer to blow the nails dry after shower and exercise. He should verify that the tennis shoes  are correct size.

## 2010-12-20 NOTE — Patient Instructions (Signed)
After showering  use a hairdryer to blow the nails dry. Also do this after any activities which cause sweating. Change socks repeatedly through the day if the feet do sweat. Tennis shoes or other foot wear which appear somewhat moldy should be discarded. 

## 2010-12-23 ENCOUNTER — Ambulatory Visit (INDEPENDENT_AMBULATORY_CARE_PROVIDER_SITE_OTHER): Payer: 59 | Admitting: Family Medicine

## 2010-12-23 DIAGNOSIS — J310 Chronic rhinitis: Secondary | ICD-10-CM

## 2010-12-23 MED ORDER — BENZONATATE 200 MG PO CAPS: 200.0000 mg | ORAL_CAPSULE | Freq: Three times a day (TID) | ORAL | Status: AC | PRN

## 2010-12-23 NOTE — Progress Notes (Signed)
  Subjective:    Patient ID: Douglas Johnston, male    DOB: 23-Jun-1945, 65 y.o.   MRN: [id number]  HPI URI- saw Dr Alwyn Ren on Tuesday, started on Zpack.  Reports cough continues, now w/ sinus pressure, ear fullness.  No fevers.  + sneezing, watery eyes.   Review of Systems For ROS see HPI     Objective:   Physical Exam  Constitutional: He appears well-developed and well-nourished. No distress.  HENT:  Head: Normocephalic and atraumatic.       No TTP over sinuses + turbinate edema + PND TMs normal bilaterally  Eyes: Conjunctivae and EOM are normal. Pupils are equal, round, and reactive to light.  Neck: Normal range of motion. Neck supple.  Cardiovascular: Normal rate, regular rhythm and normal heart sounds.   Pulmonary/Chest: Effort normal and breath sounds normal. No respiratory distress. He has no wheezes.  Lymphadenopathy:    He has no cervical adenopathy.  Skin: Skin is warm and dry.          Assessment & Plan:

## 2010-12-23 NOTE — Patient Instructions (Addendum)
Finish the [employer] as directed Add Claritin or Zyrtec daily Add Mucinex to thin your congestion Use the nasal spray- 2 sprays each nostril daily- to decrease congestion and post nasal drip Take Tessalon as needed for cough Drink plenty of fluids REST! Call with any questions or concerns Happy Holidays!

## 2010-12-25 DIAGNOSIS — J310 Chronic rhinitis: Secondary | ICD-10-CM | POA: Insufficient documentation

## 2010-12-25 NOTE — Assessment & Plan Note (Signed)
Pt's current sxs are most likely due to untreated nasal allergies.  Finish Zpack as directed, add OTC antihistamine, sample of Nasonex given.  Reviewed supportive care and red flags that should prompt return.  Pt expressed understanding and is in agreement w/ plan.

## 2011-01-02 ENCOUNTER — Other Ambulatory Visit: Payer: Self-pay | Admitting: Internal Medicine

## 2011-06-01 ENCOUNTER — Ambulatory Visit (INDEPENDENT_AMBULATORY_CARE_PROVIDER_SITE_OTHER): Payer: 59 | Admitting: Internal Medicine

## 2011-06-01 ENCOUNTER — Encounter: Payer: Self-pay | Admitting: Internal Medicine

## 2011-06-01 VITALS — BP 112/80 | HR 70 | Temp 97.8°F | Wt 188.6 lb

## 2011-06-01 DIAGNOSIS — R059 Cough, unspecified: Secondary | ICD-10-CM

## 2011-06-01 DIAGNOSIS — I959 Hypotension, unspecified: Secondary | ICD-10-CM

## 2011-06-01 DIAGNOSIS — R05 Cough: Secondary | ICD-10-CM

## 2011-06-01 DIAGNOSIS — I10 Essential (primary) hypertension: Secondary | ICD-10-CM

## 2011-06-01 DIAGNOSIS — J309 Allergic rhinitis, unspecified: Secondary | ICD-10-CM

## 2011-06-01 LAB — BASIC METABOLIC PANEL
BUN: 20 mg/dL (ref 6–23)
CO2: 31 mEq/L (ref 19–32)
Calcium: 9.4 mg/dL (ref 8.4–10.5)
Chloride: 99 mEq/L (ref 96–112)
Creatinine, Ser: 0.9 mg/dL (ref 0.4–1.5)
GFR: 84.97 mL/min (ref >60.00)
Glucose, Bld: 68 mg/dL — ABNORMAL LOW (ref 70–99)
Potassium: 3.9 mEq/L (ref 3.5–5.1)
Sodium: 140 mEq/L (ref 135–145)

## 2011-06-01 MED ORDER — MONTELUKAST SODIUM 10 MG PO TABS: ORAL_TABLET | ORAL | Status: DC

## 2011-06-01 MED ORDER — FLUTICASONE PROPIONATE 50 MCG/ACT NA SUSP: 1.0000 | Freq: Two times a day (BID) | NASAL | Status: DC | PRN

## 2011-06-01 NOTE — Patient Instructions (Signed)
Plain Mucinex for thick secretions ;force NON dairy fluids . Use a Neti pot daily as needed for sinus congestion; going from open side to congested side . Nasal cleansing in the shower as discussed. Make sure that all residual soap is removed to prevent irritation. Fluticasone 1 spray in each nostril twice a day as needed. Use the "crossover" technique as discussed. Plain Allegra 160 daily as needed for itchy eyes & sneezing. Fill the prescription for generic Singulair if the cough persists   Blood Pressure Goal  Ideally is an AVERAGE < 135/85. This AVERAGE should be calculated from @ least 5-7 BP readings taken @ different times of day on different days of week. You should not respond to isolated BP readings , but rather the AVERAGE for that week Please try to go on My Chart within the next 24 hours to allow me to release the results directly to you. Marland Kitchen

## 2011-06-01 NOTE — Progress Notes (Signed)
  Subjective:    Patient ID: Douglas Johnston, male    DOB: [birth year], 66 y.o.   MRN: [id number]  HPI Respiratory tract infection Onset/symptoms:05/26/11 Exposures (illness/environmental/extrinsic): no Progression of symptoms: started with itchy eyes and sneezing which is now resolved, currently has chest congestion and postnasal drip with cough Treatments/response: Claritin, mucinex/ some benefit Fever/chills/sweats: clammy this am Frontal headache:no Facial pain:no Nasal purulence: clear nasal discharge Sore throat:no Dental pain:no Lymphadenopathy:no Wheezing/shortness of breath: no Cough/sputum/hemoptysis: congested cough with small amount clear sputum Pleuritic pain: no Associated extrinsic/allergic symptoms:itchy eyes/ sneezing: itchy eyes and sneezing initially Past medical history: Seasonal allergies, denies history asthma          Review of Systems blood pressure was 95/65 this morning when he was clammy. He did not take his hydrochlorothiazide this morning. Blood pressure has been found to be mildly elevated when checked at the store, up to 148/98. He denies taking decongestants  He was previously on ranitidine for allergic type reaction;but he  is not taking this at this time. He does not have significant reflux symptoms  He is not on ACE inhibitor  Blood pressure this am 95/65, with lightheadedness and feeling clammy    Objective:   Physical Exam General appearance:good health ;well nourished; no acute distress or increased work of breathing is present.  No  lymphadenopathy about the head, neck, or axilla noted.   Eyes: No conjunctival inflammation or lid edema is present.   Ears:  External ear exam shows no significant lesions or deformities.  Otoscopic examination reveals clear canals, tympanic membranes are intact bilaterally without bulging, retraction, inflammation or discharge.TMs dull  Nose:  External nasal examination shows no deformity or inflammation.  Nasal mucosa are pink and moist without lesions or exudates. No septal dislocation or deviation.No obstruction to airflow.   Oral exam: Dental hygiene is good; lips and gums are healthy appearing.There is no oropharyngeal erythema or exudate noted.     Heart:  Normal rate and regular rhythm. S1 and S2 normal without gallop, murmur, click, rub or other extra sounds.   Lungs:Chest clear to auscultation; no wheezes, rhonchi,rales ,or rubs present.No increased work of breathing.    Extremities:  No cyanosis, edema, or clubbing  noted    Skin: Warm & dry  tenting.          Assessment & Plan:  #1 extrinsic upper respiratory tract symptoms with postnasal drainage  #2 cough secondary to #1. No evidence of bronchitis  #3 hypotension this a.m.  #4 possible suboptimally controlled blood pressure  Plan: See orders and recommendations

## 2011-07-31 ENCOUNTER — Other Ambulatory Visit: Payer: Self-pay | Admitting: Internal Medicine

## 2011-07-31 MED ORDER — HYDROCHLOROTHIAZIDE 25 MG PO TABS: ORAL_TABLET | ORAL | Status: DC

## 2011-07-31 NOTE — Telephone Encounter (Signed)
RX sent

## 2011-07-31 NOTE — Telephone Encounter (Signed)
refill Hydrochlorothiazide (Tab) HYDRODIURIL 25 MG TAKE (1/2) TABLET DAILY #15, last fill 6.24.13, last ov 5.23.13 (acute)

## 2011-11-09 ENCOUNTER — Ambulatory Visit (INDEPENDENT_AMBULATORY_CARE_PROVIDER_SITE_OTHER): Payer: 59 | Admitting: Internal Medicine

## 2011-11-09 ENCOUNTER — Encounter: Payer: Self-pay | Admitting: Internal Medicine

## 2011-11-09 VITALS — BP 110/82 | HR 66 | Temp 98.4°F | Wt 190.2 lb

## 2011-11-09 DIAGNOSIS — J0110 Acute frontal sinusitis, unspecified: Secondary | ICD-10-CM

## 2011-11-09 MED ORDER — FLUTICASONE PROPIONATE 50 MCG/ACT NA SUSP: 1.0000 | Freq: Two times a day (BID) | NASAL | Status: DC | PRN

## 2011-11-09 MED ORDER — AMOXICILLIN-POT CLAVULANATE 875-125 MG PO TABS: 1.0000 | ORAL_TABLET | Freq: Two times a day (BID) | ORAL | Status: DC

## 2011-11-09 NOTE — Progress Notes (Signed)
  Subjective:    Patient ID: Douglas Johnston, male    DOB: 02/26/1945, 66 y.o.   MRN: [id number]  HPI Symptoms began in 11/05/11 as head congestion followed by rhinitis and itchy watery eyes. As of 10/28 he was having yellow-green nasal discharge and frontal headaches. He employed nasal lavage, Mucinex, and started taking amoxicillin left from a previous prescription. He took this for 2 and half days.    Review of Systems  He denies fever, chills, sweats, facial pain, sore throat, dental pain, earache, or otic discharge. He denies cough or sputum production, dyspnea, or wheezing.     Objective:   Physical Exam General appearance:good health ;well nourished; no acute distress or increased work of breathing is present.  No  lymphadenopathy about the head, neck, or axilla noted.   Eyes: No conjunctival inflammation or lid edema is present.   Ears:  External ear exam shows no significant lesions or deformities.  Otoscopic examination reveals clear canals, tympanic membranes are intact bilaterally without bulging, retraction, inflammation or discharge.Slight TMJ asymmetry  Nose:  External nasal examination shows no deformity or inflammation. Nasal mucosa are pink and moist without lesions or exudates. L septal  Deviation with some obstruction to airflow.   Oral exam: Dental hygiene is good; lips and gums are healthy appearing.There is no oropharyngeal erythema or exudate noted.   Neck:  No deformities,  masses, or tenderness noted.      Heart:  Normal rate and regular rhythm. S1 and S2 normal without gallop, murmur, click, rub or other extra sounds.   Lungs:Chest clear to auscultation; no wheezes, rhonchi,rales ,or rubs present.No increased work of breathing.    Extremities:  No cyanosis, edema, or clubbing  noted    Skin: Warm & dry           Assessment & Plan:  #1 frontal rhinosinusitis in the context of septal deviation  #2 past history of atrial septal defect repair. It  should be clarified whether he indeed needs SBE prophylaxis prior to dental work  Plan: See orders and recommendations

## 2011-11-09 NOTE — Patient Instructions (Addendum)
Plain Mucinex for thick secretions ;force NON dairy fluids . Use a Neti pot daily as needed for sinus congestion; going from open side to congested side . Nasal cleansing in the shower as discussed. Make sure that all residual soap is removed to prevent irritation. Fluticasone 1 spray in each nostril twice a day as needed. Use the "crossover" technique as discussed. Plain Allegra 160 daily as needed for itchy eyes & sneezing.  Based on the [place] [facility] [place] cardiovascular protocol, antibiotics are not necessary prior dental work with atrial septal defect, particularly if has been repaired.

## 2011-12-08 ENCOUNTER — Encounter: Payer: 59 | Admitting: Internal Medicine

## 2011-12-08 ENCOUNTER — Ambulatory Visit (INDEPENDENT_AMBULATORY_CARE_PROVIDER_SITE_OTHER): Payer: 59 | Admitting: Internal Medicine

## 2011-12-08 ENCOUNTER — Encounter: Payer: Self-pay | Admitting: Internal Medicine

## 2011-12-08 VITALS — BP 140/98 | HR 64 | Temp 97.9°F | Wt 191.0 lb

## 2011-12-08 DIAGNOSIS — R9431 Abnormal electrocardiogram [ECG] [EKG]: Secondary | ICD-10-CM

## 2011-12-08 DIAGNOSIS — K5790 Diverticulosis of intestine, part unspecified, without perforation or abscess without bleeding: Secondary | ICD-10-CM | POA: Insufficient documentation

## 2011-12-08 DIAGNOSIS — Z23 Encounter for immunization: Secondary | ICD-10-CM

## 2011-12-08 DIAGNOSIS — Z0000 Encounter for general adult medical examination without abnormal findings: Secondary | ICD-10-CM

## 2011-12-08 LAB — BASIC METABOLIC PANEL
BUN: 16 mg/dL (ref 6–23)
CO2: 29 mEq/L (ref 19–32)
Calcium: 8.9 mg/dL (ref 8.4–10.5)
Chloride: 103 mEq/L (ref 96–112)
Creatinine, Ser: 0.9 mg/dL (ref 0.4–1.5)
GFR: 90.37 mL/min (ref >60.00)
Glucose, Bld: 100 mg/dL — ABNORMAL HIGH (ref 70–99)
Potassium: 4.4 mEq/L (ref 3.5–5.1)
Sodium: 138 mEq/L (ref 135–145)

## 2011-12-08 LAB — LIPID PANEL
Cholesterol: 171 mg/dL (ref 0–200)
HDL: 47.5 mg/dL (ref >39.00)
LDL Cholesterol: 109 mg/dL — ABNORMAL HIGH (ref 0–99)
Total CHOL/HDL Ratio: 4
Triglycerides: 71 mg/dL (ref 0.0–149.0)
VLDL: 14.2 mg/dL (ref 0.0–40.0)

## 2011-12-08 LAB — CBC WITH DIFFERENTIAL/PLATELET
Basophils Absolute: 0 10*3/uL (ref 0.0–0.1)
Basophils Relative: 0.6 % (ref 0.0–3.0)
Eosinophils Absolute: 0.1 10*3/uL (ref 0.0–0.7)
Eosinophils Relative: 1.8 % (ref 0.0–5.0)
HCT: 42.5 % (ref 39.0–52.0)
Hemoglobin: 14 g/dL (ref 13.0–17.0)
Lymphocytes Relative: 33.8 % (ref 12.0–46.0)
Lymphs Abs: 1.8 10*3/uL (ref 0.7–4.0)
MCHC: 33 g/dL (ref 30.0–36.0)
MCV: 94.3 fl (ref 78.0–100.0)
Monocytes Absolute: 0.4 10*3/uL (ref 0.1–1.0)
Monocytes Relative: 6.5 % (ref 3.0–12.0)
Neutro Abs: 3.1 10*3/uL (ref 1.4–7.7)
Neutrophils Relative %: 57.3 % (ref 43.0–77.0)
Platelets: 222 10*3/uL (ref 150.0–400.0)
RBC: 4.5 Mil/uL (ref 4.22–5.81)
RDW: 12.6 % (ref 11.5–14.6)
WBC: 5.4 10*3/uL (ref 4.5–10.5)

## 2011-12-08 LAB — HEPATIC FUNCTION PANEL
ALT: 19 U/L (ref 0–53)
AST: 23 U/L (ref 0–37)
Albumin: 4.2 g/dL (ref 3.5–5.2)
Alkaline Phosphatase: 62 U/L (ref 39–117)
Bilirubin, Direct: 0.2 mg/dL (ref 0.0–0.3)
Total Bilirubin: 1.2 mg/dL (ref 0.3–1.2)
Total Protein: 7.4 g/dL (ref 6.0–8.3)

## 2011-12-08 LAB — TSH: TSH: 3.94 u[IU]/mL (ref 0.35–5.50)

## 2011-12-08 NOTE — Patient Instructions (Addendum)
Preventive Health Care: Eat a low-fat diet with lots of fruits and vegetables, up to 7-9 servings per day.  Take the EKG to any emergency room or preop visits. There are nonspecific changes; as long as there is no new change these are not clinically significant.  If you activate My Chart; the results can be released to you as soon as they populate from the lab. If you choose not to use this program; the labs have to be reviewed, copied & mailed   causing a delay in getting the results to you.

## 2011-12-08 NOTE — Progress Notes (Signed)
  Subjective:    Patient ID: Douglas Johnston, male    DOB: 1945/08/25, 66 y.o.   MRN: [id number]  HPI  Douglas Johnston is here for a physical; he denies acute health  issues .      Review of Systems He exercises approximately 4 hours per week. He denies associated chest pain, dyspnea, palpitations, or claudication. He is on a heart healthy diet.  Past history of significance is  an atrial septal defect repair at the age of 1. This has resulted in EKG changes which are stable.     Objective:   Physical Exam Gen.: Healthy and well-nourished in appearance. Alert, appropriate and cooperative throughout exam. Head: Normocephalic without obvious abnormalities;  pattern alopecia  Eyes: No corneal or conjunctival inflammation noted. Pupils equal round reactive to light and accommodation. Fundal exam is benign without hemorrhages, exudate, papilledema. Extraocular motion intact. Vision grossly normal. Ears: External  ear exam reveals no significant lesions or deformities. Canals clear .TMs normal. Hearing is grossly normal bilaterally. Nose: External nasal exam reveals no deformity or inflammation. Nasal mucosa are pink and moist. No lesions or exudates noted.   Mouth: Oral mucosa and oropharynx reveal no lesions or exudates. Teeth in good repair. Neck: No deformities, masses, or tenderness noted. Range of motion &Thyroid  n. Lungs: Normal respiratory effort; chest expands symmetrically. Lungs are clear to auscultation without rales, wheezes, or increased work of breathing. Heart: Normal rate and rhythm. Normal S1 and S2. No gallop, click, or rub. No murmur. Abdomen: Bowel sounds normal; abdomen soft and nontender. No masses, organomegaly or hernias noted. Genitalia: Dr Annabell Howells, Urology                                                                  Musculoskeletal/extremities: No deformity or scoliosis noted of  the thoracic or lumbar spine. No clubbing, cyanosis, edema, or deformity noted. Range  of motion  normal .Tone & strength  normal.Joints normal. Nail health  good. Vascular: Carotid, radial artery, dorsalis pedis and  posterior tibial pulses are full and equal. No bruits present. Neurologic: Alert and oriented x3. Deep tendon reflexes symmetrical and normal.          Skin: Intact without suspicious lesions or rashes. Lymph: No cervical, axillary lymphadenopathy present. Psych: Mood and affect are normal. Normally interactive                                                                                        Assessment & Plan:  #1 comprehensive physical exam; no acute findings  Plan: see Orders

## 2011-12-11 ENCOUNTER — Ambulatory Visit: Payer: 59

## 2011-12-11 DIAGNOSIS — R7309 Other abnormal glucose: Secondary | ICD-10-CM

## 2011-12-11 LAB — HEMOGLOBIN A1C: Hgb A1c MFr Bld: 5.6 % (ref 4.6–6.5)

## 2011-12-14 ENCOUNTER — Encounter: Payer: 59 | Admitting: Internal Medicine

## 2011-12-25 ENCOUNTER — Telehealth: Payer: Self-pay | Admitting: Internal Medicine

## 2011-12-25 NOTE — Telephone Encounter (Signed)
He should be seen for persistent headaches or any change in mental status.

## 2011-12-25 NOTE — Telephone Encounter (Signed)
[facility]opp patient was given at home instruction and CAN indicated no follow-up needed from office. Please advise

## 2011-12-25 NOTE — Telephone Encounter (Signed)
Patient Information:  Caller Name: Drenda Freeze  Phone: [phone]  Patient: Douglas Johnston, Douglas Johnston  Gender: Male  DOB: 31-Jul-1945  Age: 66 Years  PCP: Marga Melnick  Office Follow Up:  Does the office need to follow up with this patient?: No  Instructions For The Office: N/A   Symptoms  Reason For Call & Symptoms: was hit on right side of head this morning with a racket ball, racket.  Has a knot of the side of his face.  No LOC.  She conferenced him on the line with [place] to complete triage.   Reviewed Health History In EMR: Yes  Reviewed Medications In EMR: Yes  Reviewed Allergies In EMR: Yes  Reviewed Surgeries / Procedures: Yes  Date of Onset of Symptoms: 12/25/2011  Treatments Tried: ice to the area  Treatments Tried Worked: No  Guideline(s) Used:  Head Injury  Disposition Per Guideline:   Home Care  Reason For Disposition Reached:   Minor head injury  Advice Given:  Reassurance - Direct Blow (Contusion, Bruise)  This sounds like a scalp injury rather than a brain injury or concussion. Treatment at home should be safe. A direct blow to your scalp can cause a contusion. Contusion is the medical term for bruise.  Here is some care advice that should help.  Apply a Cold Pack:  Apply a cold pack or an ice bag (wrapped in a moist towel) to the area for 20 minutes. Repeat in 1 hour, then every 4 hours while awake.  Continue this for the first 48 hours after an injury.  This will help decrease pain and swelling.  Call Back If:  Severe headache  Extremity weakness or numbness occurs  Slurred speech or blurred vision occurs  Vomiting occurs  You become worse.  Pain Medicines:  Acetaminophen (e.g., Tylenol):  Regular Strength Tylenol: Take 650 mg (two 325 mg pills) by mouth every 4-6 hours as needed. Each Regular Strength Tylenol pill has 325 mg of acetaminophen.

## 2011-12-26 NOTE — Telephone Encounter (Signed)
Spoke with patient's wife, patient is w/o HA and no change in mental status. Patient did not feel as though he should be seen right now. Patient to call for appointment if any changes noted.

## 2012-01-09 ENCOUNTER — Other Ambulatory Visit: Payer: Self-pay | Admitting: Internal Medicine

## 2012-01-09 NOTE — Telephone Encounter (Signed)
Spoke with patient, patient states this was rx'ed by Dr.Fields for inflammation in his shoulder. RX was sent to [place] in error and should be sent to Dr.Fields   I called [facility] and spoke with [place] and informed her rx to be sent to Dr.Fields. I updated med list

## 2012-01-09 NOTE — Telephone Encounter (Signed)
Left message on VM for patient to return call when available, reason for call: Mobic is not currently on med list, ? Patient's need for medication

## 2012-01-09 NOTE — Telephone Encounter (Signed)
refill meloxicam 7.5mg  tablet #60 take 1 or 2 tabs daily as direceted, last fill 9.10.13--last ov wt/labs 11.29.13 V70 NOTE not on current meds listed shows discontinued on 5.23.13

## 2012-02-24 ENCOUNTER — Other Ambulatory Visit: Payer: Self-pay

## 2012-03-28 DIAGNOSIS — IMO0002 Reserved for concepts with insufficient information to code with codable children: Secondary | ICD-10-CM | POA: Insufficient documentation

## 2012-06-25 ENCOUNTER — Other Ambulatory Visit: Payer: Self-pay | Admitting: Internal Medicine

## 2012-06-27 ENCOUNTER — Encounter: Payer: Self-pay | Admitting: Family Medicine

## 2012-06-27 ENCOUNTER — Ambulatory Visit: Payer: 59 | Admitting: Family Medicine

## 2012-06-27 ENCOUNTER — Ambulatory Visit (INDEPENDENT_AMBULATORY_CARE_PROVIDER_SITE_OTHER): Payer: 59 | Admitting: Family Medicine

## 2012-06-27 VITALS — BP 140/80 | HR 64 | Temp 97.5°F | Ht 69.5 in | Wt 193.0 lb

## 2012-06-27 DIAGNOSIS — S30860A Insect bite (nonvenomous) of lower back and pelvis, initial encounter: Secondary | ICD-10-CM

## 2012-06-27 DIAGNOSIS — W57XXXA Bitten or stung by nonvenomous insect and other nonvenomous arthropods, initial encounter: Secondary | ICD-10-CM | POA: Insufficient documentation

## 2012-06-27 NOTE — Patient Instructions (Addendum)
The majority of the head has been removed.  There may be a small piece remaining but this will expel itself w/ time Apply hydrogen peroxide twice daily and then apply neopsorin and cover If you develop redness that extends from the center, swelling, rash, headache, or fever- please call immediately for antibiotics Call with any questions or concerns Hang in there!

## 2012-06-27 NOTE — Progress Notes (Signed)
  Subjective:    Patient ID: Douglas Johnston, male    DOB: Jun 07, 1945, 67 y.o.   MRN: [id number]  HPI Tick bite- wife pulled tick off pt's back last night.  'didn't like the way it looked'.  Pt suspects tick has been present since Saturday when he was in the woods.  No pain.  No fevers.  No rash.  No HA.  Pt thinks tick was larger in size than a deer tick- likely a dog tick.   Review of Systems For ROS see HPI     Objective:   Physical Exam  Vitals reviewed. Constitutional: He appears well-developed and well-nourished. No distress.  Skin: Skin is warm and dry. There is erythema (mild erythema surround tick bite on R shoulder- head of tick still embedded.  area cleaned w/ H2O2,  ETOH prep and then head removed w/ forceps).          Assessment & Plan:

## 2012-07-18 NOTE — Assessment & Plan Note (Signed)
New.  Area doesn't appear infected and no suspicion for lyme or RMSF.  Head of tick removed in office.  Pt tolerated procedure w/out difficulty.

## 2012-09-27 ENCOUNTER — Other Ambulatory Visit: Payer: Self-pay | Admitting: Internal Medicine

## 2012-10-02 NOTE — Telephone Encounter (Signed)
Rx sent to the pharmacy by e-script.//AB/CMA 

## 2012-10-16 ENCOUNTER — Ambulatory Visit (INDEPENDENT_AMBULATORY_CARE_PROVIDER_SITE_OTHER): Payer: 59 | Admitting: Internal Medicine

## 2012-10-16 ENCOUNTER — Encounter: Payer: Self-pay | Admitting: Internal Medicine

## 2012-10-16 VITALS — BP 138/80 | HR 79 | Temp 98.4°F | Resp 12 | Wt 192.0 lb

## 2012-10-16 DIAGNOSIS — Z23 Encounter for immunization: Secondary | ICD-10-CM

## 2012-10-16 DIAGNOSIS — K5730 Diverticulosis of large intestine without perforation or abscess without bleeding: Secondary | ICD-10-CM

## 2012-10-16 DIAGNOSIS — R9431 Abnormal electrocardiogram [ECG] [EKG]: Secondary | ICD-10-CM

## 2012-10-16 DIAGNOSIS — I10 Essential (primary) hypertension: Secondary | ICD-10-CM

## 2012-10-16 DIAGNOSIS — R7309 Other abnormal glucose: Secondary | ICD-10-CM

## 2012-10-16 DIAGNOSIS — E291 Testicular hypofunction: Secondary | ICD-10-CM

## 2012-10-16 DIAGNOSIS — E785 Hyperlipidemia, unspecified: Secondary | ICD-10-CM

## 2012-10-16 DIAGNOSIS — R739 Hyperglycemia, unspecified: Secondary | ICD-10-CM | POA: Insufficient documentation

## 2012-10-16 LAB — CBC WITH DIFFERENTIAL/PLATELET
Basophils Absolute: 0 10*3/uL (ref 0.0–0.1)
Basophils Relative: 0.3 % (ref 0.0–3.0)
Eosinophils Absolute: 0.1 10*3/uL (ref 0.0–0.7)
Eosinophils Relative: 0.7 % (ref 0.0–5.0)
HCT: 40.8 % (ref 39.0–52.0)
Hemoglobin: 14.1 g/dL (ref 13.0–17.0)
Lymphocytes Relative: 18.6 % (ref 12.0–46.0)
Lymphs Abs: 1.4 10*3/uL (ref 0.7–4.0)
MCHC: 34.5 g/dL (ref 30.0–36.0)
MCV: 92.7 fl (ref 78.0–100.0)
Monocytes Absolute: 0.5 10*3/uL (ref 0.1–1.0)
Monocytes Relative: 6.1 % (ref 3.0–12.0)
Neutro Abs: 5.8 10*3/uL (ref 1.4–7.7)
Neutrophils Relative %: 74.3 % (ref 43.0–77.0)
Platelets: 235 10*3/uL (ref 150.0–400.0)
RBC: 4.4 Mil/uL (ref 4.22–5.81)
RDW: 12.8 % (ref 11.5–14.6)
WBC: 7.8 10*3/uL (ref 4.5–10.5)

## 2012-10-16 LAB — HEMOGLOBIN A1C: Hgb A1c MFr Bld: 5.7 % (ref 4.6–6.5)

## 2012-10-16 NOTE — Assessment & Plan Note (Signed)
BMET BP goals reviewed 

## 2012-10-16 NOTE — Progress Notes (Signed)
  Subjective:    Patient ID: Douglas Johnston, male    DOB: [birth year], 67 y.o.   MRN: [id number]  HPI  He returns for followup of hypertension, dyslipidemia, and elevated fasting glucose.  His blood pressure range is 128/80-140/90 on HCTZ 12.5 mg daily. He's had no adverse effects with the medications.  He is exercising 4-5 hours per week.  In November of last year his fasting blood sugar was 100; but his A1c was nondiabetic at 5.6%.  His LDL at that time was 109; HDL 47.5. TSH was therapeutic but had risen to 3.94.  He is on testosterone supplementation from Dr Annabell Howells, who monitors his PSA.   Review of Systems He is on a heart healthy diet. CVE is not associated with symptoms. Specifically he denies chest pain, palpitations, dyspnea, or claudication.  Family history is negative for premature coronary disease. Advanced cholesterol testing reveals his LDL goal is less than 100. No statin to date.   He denies polyuria, polyphagia, or polydipsia. He has no numbness, tingling, burning in his extremities. He has no nonhealing skin lesions. His ophthalmologic exam is up-to-date; no retinopathy present. He did have cataracts  diagnosed in June of this year     Objective:   Physical Exam Gen.: Healthy and well-nourished in appearance. Alert, appropriate and cooperative throughout exam.Appears younger than stated age  Head: Normocephalic without obvious abnormalities; pattern alopecia. Moustache Eyes: No corneal or conjunctival inflammation noted. OS light reflex decreased > OD. Thyroid normal to palpation without enlargement or nodularity. Lungs clear to auscultation. No wheezing, rhonchi or rales present.  No increased work of breathing. Heart: Normal rate and rhythm. Normal S1 and S2. No gallop, click, or rub. No murmur. Abdomen: Bowel sounds normal; abdomen soft and nontender. No masses, organomegaly or hernias noted. Genitalia: As per Dr Annabell Howells                                   Musculoskeletal/extremities: No clubbing, cyanosis, edema, or significant extremity  deformity noted. Range of motion normal .Tone & strength  Normal. Joints  reveal minor  DJD DIP changes. Nail health good. Able to lie down & sit up w/o help.  Vascular: Carotid, radial artery, dorsalis pedis and  posterior tibial pulses are full and equal. No bruits present. Neurologic: Alert and oriented x3.        Skin: Intact without suspicious lesions or rashes. Lymph: No cervical, axillary lymphadenopathy present. Psych: Mood and affect are normal. Normally interactive                                                                                        Assessment & Plan:  See Current Assessment & Plan in Problem List under specific Diagnosis

## 2012-10-16 NOTE — Assessment & Plan Note (Signed)
Check A1c. 

## 2012-10-16 NOTE — Assessment & Plan Note (Signed)
Potential or possible cardiac risk related to testosterone replacement were discussed.

## 2012-10-16 NOTE — Assessment & Plan Note (Signed)
Lipids, TSH,LFTs

## 2012-10-16 NOTE — Patient Instructions (Signed)
Minimal Blood Pressure Goal= AVERAGE < 140/90;  Ideal is an AVERAGE < 135/85. This AVERAGE should be calculated from @ least 5-7 BP readings taken @ different times of day on different days of week. You should not respond to isolated BP readings , but rather the AVERAGE for that week .Please bring your  blood pressure cuff to office visits to verify that it is reliable.It  can also be checked against the blood pressure device at the pharmacy. Finger or wrist cuffs are not dependable; an arm cuff is. Share results with all non [facility] medical staff seen

## 2012-10-16 NOTE — Assessment & Plan Note (Signed)
CBC & dif 

## 2012-10-16 NOTE — Assessment & Plan Note (Signed)
To take the EKG to any emergency room or preop visits. There are nonspecific changes; as long as there is no new change these are not clinically significant . If the old EKG is not available for comparison; it may result in unnecessary hospitalization for observation with significant unnecessary expense.

## 2012-10-17 LAB — BASIC METABOLIC PANEL
BUN: 19 mg/dL (ref 6–23)
CO2: 29 mEq/L (ref 19–32)
Calcium: 9.3 mg/dL (ref 8.4–10.5)
Chloride: 100 mEq/L (ref 96–112)
Creatinine, Ser: 1 mg/dL (ref 0.4–1.5)
GFR: 83.6 mL/min (ref >60.00)
Glucose, Bld: 91 mg/dL (ref 70–99)
Potassium: 3.9 mEq/L (ref 3.5–5.1)
Sodium: 140 mEq/L (ref 135–145)

## 2012-10-17 LAB — HEPATIC FUNCTION PANEL
ALT: 19 U/L (ref 0–53)
AST: 27 U/L (ref 0–37)
Albumin: 4.5 g/dL (ref 3.5–5.2)
Alkaline Phosphatase: 55 U/L (ref 39–117)
Bilirubin, Direct: 0.1 mg/dL (ref 0.0–0.3)
Total Bilirubin: 1.2 mg/dL (ref 0.3–1.2)
Total Protein: 7.2 g/dL (ref 6.0–8.3)

## 2012-10-17 LAB — LIPID PANEL
Cholesterol: 170 mg/dL (ref 0–200)
HDL: 46 mg/dL (ref >39.00)
LDL Cholesterol: 107 mg/dL — ABNORMAL HIGH (ref 0–99)
Total CHOL/HDL Ratio: 4
Triglycerides: 85 mg/dL (ref 0.0–149.0)
VLDL: 17 mg/dL (ref 0.0–40.0)

## 2012-10-17 LAB — TSH: TSH: 3.23 u[IU]/mL (ref 0.35–5.50)

## 2012-12-18 ENCOUNTER — Other Ambulatory Visit: Payer: Self-pay | Admitting: *Deleted

## 2012-12-18 ENCOUNTER — Telehealth: Payer: Self-pay | Admitting: Internal Medicine

## 2012-12-18 DIAGNOSIS — Z0000 Encounter for general adult medical examination without abnormal findings: Secondary | ICD-10-CM

## 2012-12-18 NOTE — Telephone Encounter (Signed)
Please  schedule fasting Labs for [facility] : BMET,Lipids, hepatic panel, CBC & dif, TSH Code : V70.0.

## 2012-12-18 NOTE — Telephone Encounter (Signed)
Called and left message for patients wife, Scarlette Calico, to call back and schedule lab appt. Labs already entered into Epic.

## 2012-12-18 NOTE — Telephone Encounter (Signed)
Please advise 

## 2012-12-18 NOTE — Telephone Encounter (Signed)
Patient's wife is calling to request getting her CPE labs before the patient's appointment on Friday to avoid having to come back in for labs on another day. Please advise.

## 2012-12-20 ENCOUNTER — Ambulatory Visit (INDEPENDENT_AMBULATORY_CARE_PROVIDER_SITE_OTHER): Payer: 59 | Admitting: Internal Medicine

## 2012-12-20 ENCOUNTER — Encounter: Payer: Self-pay | Admitting: Internal Medicine

## 2012-12-20 VITALS — BP 131/71 | HR 67 | Temp 98.2°F | Resp 12 | Ht 69.75 in | Wt 191.6 lb

## 2012-12-20 DIAGNOSIS — Z8679 Personal history of other diseases of the circulatory system: Secondary | ICD-10-CM

## 2012-12-20 DIAGNOSIS — Z0000 Encounter for general adult medical examination without abnormal findings: Secondary | ICD-10-CM

## 2012-12-20 DIAGNOSIS — I10 Essential (primary) hypertension: Secondary | ICD-10-CM

## 2012-12-20 DIAGNOSIS — R0989 Other specified symptoms and signs involving the circulatory and respiratory systems: Secondary | ICD-10-CM

## 2012-12-20 NOTE — Patient Instructions (Signed)
Your next office appointment will be determined based upon review of your pending Carotid Doppler. Thoseresults will be transmitted to you through My  Chart

## 2012-12-20 NOTE — Progress Notes (Signed)
Subjective:    Patient ID: Douglas Johnston, male    DOB: September 16, 1945, 67 y.o.   MRN: [id number]  HPI  He is here for a physical;acute issues denied.     Review of Systems  A heart healthy diet is followed; exercise encompasses 4-5 hrs per week as  Racquetball & weights without symptoms.  Family history is negative for premature coronary disease. Advanced cholesterol testing reveals  LDL goal is less than 100 ; ideally < 70 . To date no statin.  Low dose ASA not taken Specifically denied are  chest pain, palpitations, dyspnea, or claudication.  BP not monitored @ home regularly ; previously 135/82 on average.      Objective:   Physical Exam Gen.: Healthy and well-nourished in appearance. Alert, appropriate and cooperative throughout exam.Appears younger than stated age  Head: Normocephalic without obvious abnormalities;moustache Eyes: No corneal or conjunctival inflammation noted. Pupils equal round reactive to light and accommodation. Extraocular motion intact.  Ears: External  ear exam reveals no significant lesions or deformities. Canals clear .TMs normal. Hearing is grossly normal bilaterally. Nose: External nasal exam reveals no deformity or inflammation. Nasal mucosa are pink and moist. No lesions or exudates noted.   Mouth: Oral mucosa and oropharynx reveal no lesions or exudates. Teeth in good repair. Neck: No deformities, masses, or tenderness noted. Range of motion decreased. Thyroid normal. Lungs: Normal respiratory effort; chest expands symmetrically. Lungs are clear to auscultation without rales, wheezes, or increased work of breathing. Heart: Normal rate and rhythm. Normal S1 and S2. No gallop, click, or rub. S4 w/o murmur. Abdomen: Bowel sounds normal; abdomen soft and nontender. No masses, organomegaly or hernias noted. Genitalia: as per Dr Annabell Howells                                 Musculoskeletal/extremities: No deformity or scoliosis noted of  the thoracic or lumbar  spine.    No clubbing, cyanosis, edema, or significant extremity  deformity noted. Range of motion normal .Tone & strength normal. Hand joints normal . Fingernail  health good. Able to lie down & sit up w/o help. Negative SLR bilaterally Vascular: Carotid, radial artery, dorsalis pedis and  posterior tibial pulses are full and equal.  Right supraclavicular bruit present. Neurologic: Alert and oriented x3. Deep tendon reflexes symmetrical and normal.       Skin: Intact without suspicious lesions or rashes. Lymph: No cervical, axillary lymphadenopathy present. Psych: Mood and affect are normal. Normally interactive                                                                                        Assessment & Plan:  #1 comprehensive physical exam; no acute findings #2 right supraclavicular bruit  Plan: see Orders  & Recommendations  The EKG may demonstrate subtle progression of the nonspecific T changes which are diffuse. Otherwise it is unchanged. 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 At this time he is exercising a high level with no symptoms. Should he have exertional dyspnea or chest discomfort with CVE,  a nuclear stress test would be  scheduled. This was discussed with him.

## 2012-12-20 NOTE — Progress Notes (Signed)
Pre visit review using our clinic review tool, if applicable. No additional management support is needed unless otherwise documented below in the visit note. 

## 2012-12-23 ENCOUNTER — Other Ambulatory Visit: Payer: Self-pay | Admitting: *Deleted

## 2012-12-23 DIAGNOSIS — R0989 Other specified symptoms and signs involving the circulatory and respiratory systems: Secondary | ICD-10-CM

## 2012-12-27 ENCOUNTER — Encounter: Payer: Self-pay | Admitting: Cardiology

## 2012-12-27 ENCOUNTER — Ambulatory Visit (HOSPITAL_COMMUNITY): Payer: 59 | Attending: Cardiology

## 2012-12-27 DIAGNOSIS — I10 Essential (primary) hypertension: Secondary | ICD-10-CM | POA: Insufficient documentation

## 2012-12-27 DIAGNOSIS — R0989 Other specified symptoms and signs involving the circulatory and respiratory systems: Secondary | ICD-10-CM | POA: Insufficient documentation

## 2012-12-27 DIAGNOSIS — E785 Hyperlipidemia, unspecified: Secondary | ICD-10-CM | POA: Insufficient documentation

## 2012-12-28 ENCOUNTER — Encounter: Payer: Self-pay | Admitting: Internal Medicine

## 2012-12-28 DIAGNOSIS — R0989 Other specified symptoms and signs involving the circulatory and respiratory systems: Secondary | ICD-10-CM | POA: Insufficient documentation

## 2012-12-31 ENCOUNTER — Other Ambulatory Visit: Payer: Self-pay | Admitting: *Deleted

## 2012-12-31 MED ORDER — HYDROCHLOROTHIAZIDE 25 MG PO TABS: ORAL_TABLET | ORAL | Status: DC

## 2013-01-09 DIAGNOSIS — H269 Unspecified cataract: Secondary | ICD-10-CM

## 2013-01-09 HISTORY — DX: Unspecified cataract: H26.9

## 2013-01-29 ENCOUNTER — Ambulatory Visit (INDEPENDENT_AMBULATORY_CARE_PROVIDER_SITE_OTHER): Payer: 59 | Admitting: Nurse Practitioner

## 2013-01-29 VITALS — BP 110/74 | HR 68 | Temp 97.9°F | Ht 69.75 in | Wt 186.8 lb

## 2013-01-29 DIAGNOSIS — S0191XA Laceration without foreign body of unspecified part of head, initial encounter: Secondary | ICD-10-CM

## 2013-01-29 DIAGNOSIS — S0190XA Unspecified open wound of unspecified part of head, initial encounter: Secondary | ICD-10-CM

## 2013-01-29 NOTE — Progress Notes (Signed)
   Subjective:    Patient ID: Douglas Johnston, male    DOB: [birth year], 68 y.o.   MRN: [id number]  Head Injury  The incident occurred 6 to 12 hours ago. The injury mechanism was a direct blow (hit with raquet ball raquet while playing raquet ball.). There was no loss of consciousness. There was no blood loss. Quality: tender to palpation, no headache. The pain is mild. The pain has been improving since the injury. Pertinent negatives include no blurred vision, disorientation, headaches, memory loss, numbness, tinnitus, vomiting or weakness. Associated symptoms comments: "balance felt off when walking briskly at work" . He has tried nothing for the symptoms.      Review of Systems  Constitutional: Positive for fatigue (feels like wants to take a nap.).  HENT: Positive for facial swelling (forehead at site of impact.). Negative for ear pain, nosebleeds and tinnitus.        Denies nose bleed   Eyes: Negative for blurred vision, pain and redness.  Gastrointestinal: Negative for vomiting.  Musculoskeletal: Positive for gait problem (felt balance was "off" at work). Negative for neck pain and neck stiffness.  Skin: Positive for wound (lac on forehead).  Neurological: Positive for facial asymmetry (L forehead slightly swollen due to injury). Negative for speech difficulty, weakness, light-headedness, numbness and headaches.  Psychiatric/Behavioral: Negative for memory loss, confusion and decreased concentration.       Objective:   Physical Exam  Vitals reviewed. Constitutional: He is oriented to person, place, and time. He appears well-developed and well-nourished. No distress.  HENT:  Head: Normocephalic and atraumatic.    Right Ear: External ear normal.  Left Ear: External ear normal.  bilat TM NML   Eyes: Conjunctivae and EOM are normal. Pupils are equal, round, and reactive to light. Right eye exhibits no discharge. Left eye exhibits no discharge.  Cardiovascular: Normal rate.     Pulmonary/Chest: Effort normal.  Neurological: He is alert and oriented to person, place, and time. No cranial nerve deficit. Coordination normal.  Mini mental exam score 29/29 (didn't ask pt to copy image). Heel to toe walk, grabbed table once.  Skin: Skin is warm and dry.  linear vertical wound forehead, superficial lac, no active bleeding, small clot in place. small hematoma under lac.  Psychiatric: He has a normal mood and affect. His behavior is normal. Judgment and thought content normal.  Mini mental exam score 29/29 (didn't ask pt to copy image).          Assessment & Plan:  1. Laceration of head without complication No LOC. No neuro deficit on exam. Pt reports balance feels "off". Wound care in ofc-hydrogen peroxise, saline cleanse. Bacitracin, steri strips. Offered head CT, pt prefers watch & wait approach. See pt instructions: adv red flags that warrant further eval.

## 2013-01-29 NOTE — Progress Notes (Signed)
Pre-visit discussion using our clinic review tool. No additional management support is needed unless otherwise documented below in the visit note.  

## 2013-01-29 NOTE — Patient Instructions (Signed)
For wound care, leave steri strips in place as long as they are firmly intact. Remove if they are hanging. Once removed, wash wound gently with mild soap & water. Pat dry. Apply thin layer of vaseline or bacitracin. Expect swollen area to bruise and turn colors from red to yellow as it heals. Let us know if you have persistent lethargy-trouble staying awake during normal daytime hours, persistent headache not relieved by tylenol, nausea, trouble with balance tomorrow.     Head Injury, Adult You have received a head injury. It does not appear serious at this time. Headaches and vomiting are common following head injury. It should be easy to awaken from sleeping. Sometimes it is necessary for you to stay in the emergency department for a while for observation. Sometimes admission to the hospital may be needed. After injuries such as yours, most problems occur within the first 24 hours, but side effects may occur up to 7 10 days after the injury. It is important for you to carefully monitor your condition and contact your health care provider or seek immediate medical care if there is a change in your condition. WHAT ARE THE TYPES OF HEAD INJURIES? Head injuries can be as minor as a bump. Some head injuries can be more severe. More severe head injuries include:  A jarring injury to the brain (concussion).  A bruise of the brain (contusion). This mean there is bleeding in the brain that can cause swelling.  A cracked skull (skull fracture).  Bleeding in the brain that collects, clots, and forms a bump (hematoma). WHAT CAUSES A HEAD INJURY? A serious head injury is most likely to happen to someone who is in a car wreck and is not wearing a seat belt. Other causes of major head injuries include bicycle or motorcycle accidents, sports injuries, and falls. HOW ARE HEAD INJURIES DIAGNOSED? A complete history of the event leading to the injury and your current symptoms will be helpful in diagnosing head  injuries. Many times, pictures of the brain, such as CT or MRI are needed to see the extent of the injury. Often, an overnight hospital stay is necessary for observation.  WHEN SHOULD I SEEK IMMEDIATE MEDICAL CARE?  You should get help right away if:  You have confusion or drowsiness.  You feel sick to your stomach (nauseous) or have continued, forceful vomiting.  You have dizziness or unsteadiness that is getting worse.  You have severe, continued headaches not relieved by medicine. Only take over-the-counter or prescription medicines for pain, fever, or discomfort as directed by your health care provider.  You do not have normal function of the arms or legs or are unable to walk.  You notice changes in the black spots in the center of the colored part of your eye (pupil).  You have a clear or bloody fluid coming from your nose or ears.  You have a loss of vision. During the next 24 hours after the injury, you must stay with someone who can watch you for the warning signs. This person should contact local emergency services (911 in the [place] if you have seizures, you become unconscious, or you are unable to wake up. HOW CAN I PREVENT A HEAD INJURY IN THE FUTURE? The most important factor for preventing major head injuries is avoiding motor vehicle accidents. To minimize the potential for damage to your head, it is crucial to wear seat belts while riding in motor vehicles. Wearing helmets while bike riding and playing collision sports (  like football) is also helpful. Also, avoiding dangerous activities around the house will further help reduce your risk of head injury.  WHEN CAN I RETURN TO NORMAL ACTIVITIES AND ATHLETICS? You should be reevaluated by your health care provider before returning to these activities. If you have any of the following symptoms, you should not return to activities or contact sports until 1 week after the symptoms have stopped:  Persistent headache.  Dizziness  or vertigo.  Poor attention and concentration.  Confusion.  Memory problems.  Nausea or vomiting.  Fatigue or tire easily.  Irritability.  Intolerant of bright lights or loud noises.  Anxiety or depression.  Disturbed sleep. MAKE SURE YOU:   Understand these instructions.  Will watch your condition.  Will get help right away if you are not doing well or get worse. Document Released: 12/26/2004 Document Revised: 10/16/2012 Document Reviewed: 09/02/2012 [facility] Patient Information 2014 [place].

## 2013-05-14 DIAGNOSIS — H02831 Dermatochalasis of right upper eyelid: Secondary | ICD-10-CM | POA: Insufficient documentation

## 2013-05-27 ENCOUNTER — Ambulatory Visit (INDEPENDENT_AMBULATORY_CARE_PROVIDER_SITE_OTHER): Payer: 59 | Admitting: Internal Medicine

## 2013-05-27 ENCOUNTER — Encounter: Payer: Self-pay | Admitting: Internal Medicine

## 2013-05-27 VITALS — BP 150/90 | HR 80 | Temp 98.8°F | Resp 16 | Wt 182.0 lb

## 2013-05-27 DIAGNOSIS — J069 Acute upper respiratory infection, unspecified: Secondary | ICD-10-CM | POA: Insufficient documentation

## 2013-05-27 MED ORDER — AZITHROMYCIN 250 MG PO TABS: ORAL_TABLET | ORAL | Status: DC

## 2013-05-27 MED ORDER — PROMETHAZINE-CODEINE 6.25-10 MG/5ML PO SYRP: 5.0000 mL | ORAL_SOLUTION | ORAL | Status: DC | PRN

## 2013-05-27 NOTE — Progress Notes (Signed)
   Subjective:    Patient ID: Douglas Johnston, male    DOB: Aug 30, 1945, 68 y.o.   MRN: [id number]  URI  The current episode started in the past 7 days. There has been no fever. Associated symptoms include congestion, coughing, rhinorrhea and a sore throat. Pertinent negatives include no abdominal pain or chest pain. He has tried acetaminophen for the symptoms. The treatment provided mild relief.      Review of Systems  HENT: Positive for congestion, rhinorrhea and sore throat.   Respiratory: Positive for cough.   Cardiovascular: Negative for chest pain.  Gastrointestinal: Negative for abdominal pain.       Objective:   Physical Exam  Constitutional: No distress.  HENT:  eryth throat  Eyes: Left eye exhibits no discharge.  Cardiovascular: Normal rate.   Pulmonary/Chest: He has no wheezes. He has no rales. He exhibits no tenderness.  Abdominal: He exhibits no mass.  Musculoskeletal: He exhibits no tenderness.          Assessment & Plan:

## 2013-05-27 NOTE — Patient Instructions (Signed)
Use over-the-counter  "cold" medicines  such as  "Afrin" nasal spray for nasal congestion as directed instead. Use" Delsym" or" Robitussin" cough syrup varietis for cough.  You can use plain "Tylenol" or "Advil" for fever, chills and achyness.  Please, make an appointment if you are not better or if you're worse.  

## 2013-05-27 NOTE — Progress Notes (Signed)
Pre visit review using our clinic review tool, if applicable. No additional management support is needed unless otherwise documented below in the visit note. 

## 2013-05-27 NOTE — Assessment & Plan Note (Signed)
Prom-cod syr Zpac if worse 

## 2013-06-03 ENCOUNTER — Other Ambulatory Visit: Payer: Self-pay | Admitting: Internal Medicine

## 2013-06-03 ENCOUNTER — Telehealth: Payer: Self-pay | Admitting: *Deleted

## 2013-06-03 NOTE — Telephone Encounter (Signed)
Alex , your call. You saw him . [employer]

## 2013-06-03 NOTE — Telephone Encounter (Signed)
Pt called states he is still congested after taking the first regiment of ZPack; he is requesting a refill.  Please advise

## 2013-06-03 NOTE — Telephone Encounter (Signed)
Ok to ref Z pac OV if not better Thx 

## 2013-06-04 MED ORDER — AZITHROMYCIN 250 MG PO TABS: ORAL_TABLET | ORAL | Status: DC

## 2013-06-04 NOTE — Telephone Encounter (Signed)
Patient last seen 05/27/13 by Dr Alain Marion

## 2013-06-04 NOTE — Telephone Encounter (Signed)
Spoke with pt advised Rx sent to pharmacy, advised of MDs message

## 2013-06-04 NOTE — Telephone Encounter (Signed)
Is this okay Dr Alain Marion?

## 2013-06-04 NOTE — Telephone Encounter (Signed)
I asked Dr Alain Marion to address; he Rxed this originally

## 2013-06-05 ENCOUNTER — Other Ambulatory Visit: Payer: Self-pay | Admitting: *Deleted

## 2013-06-05 MED ORDER — AZITHROMYCIN 250 MG PO TABS: ORAL_TABLET | ORAL | Status: DC

## 2013-07-09 HISTORY — PX: CATARACT EXTRACTION: SUR2

## 2013-07-18 DIAGNOSIS — Z961 Presence of intraocular lens: Secondary | ICD-10-CM | POA: Insufficient documentation

## 2013-08-05 ENCOUNTER — Other Ambulatory Visit: Payer: Self-pay | Admitting: Internal Medicine

## 2013-08-06 DIAGNOSIS — H26491 Other secondary cataract, right eye: Secondary | ICD-10-CM | POA: Insufficient documentation

## 2013-11-28 ENCOUNTER — Ambulatory Visit: Payer: 59

## 2013-11-28 ENCOUNTER — Ambulatory Visit (INDEPENDENT_AMBULATORY_CARE_PROVIDER_SITE_OTHER): Payer: 59

## 2013-11-28 DIAGNOSIS — Z23 Encounter for immunization: Secondary | ICD-10-CM

## 2013-12-26 ENCOUNTER — Ambulatory Visit (INDEPENDENT_AMBULATORY_CARE_PROVIDER_SITE_OTHER): Payer: 59 | Admitting: Internal Medicine

## 2013-12-26 ENCOUNTER — Encounter: Payer: Self-pay | Admitting: Internal Medicine

## 2013-12-26 VITALS — BP 156/88 | HR 56 | Temp 98.0°F | Resp 14 | Ht 72.0 in | Wt 189.6 lb

## 2013-12-26 DIAGNOSIS — J069 Acute upper respiratory infection, unspecified: Secondary | ICD-10-CM

## 2013-12-26 MED ORDER — FLUTICASONE PROPIONATE 50 MCG/ACT NA SUSP: 2.0000 | Freq: Every day | NASAL | Status: DC

## 2013-12-26 NOTE — Assessment & Plan Note (Signed)
Given rx for flonase and advised to take BID for 3 days then daily for next 2-3 weeks.

## 2013-12-26 NOTE — Progress Notes (Signed)
Pre visit review using our clinic review tool, if applicable. No additional management support is needed unless otherwise documented below in the visit note. 

## 2013-12-26 NOTE — Patient Instructions (Signed)
We have sent in a prescription for Flonase. Use 2 puffs in each nostril twice a day for 3 days, then continue using 2 puffs in each nostril once a day for the next several weeks. This will help dry up all your drainage and keep you from getting sicker.  If you have any new problems or questions or worsening of your symptoms please feel free to call our office back.

## 2013-12-26 NOTE — Progress Notes (Signed)
   Subjective:    Patient ID: Douglas Johnston, male    DOB: 02-Apr-1945, 68 y.o.   MRN: [id number]  HPI The patient is coming in for an acute visit for congestion for 1 day. 2 weeks ago he caught a cold from his wife which resolved on his own and now he is concerned it is coming back. Denies fevers, chills, sore throat, cough, ear pain or drainage.   Review of Systems  Constitutional: Negative for fever, chills, activity change, appetite change, fatigue and unexpected weight change.  HENT: Positive for congestion, postnasal drip and rhinorrhea. Negative for ear discharge, ear pain, facial swelling, sinus pressure, sneezing, sore throat and voice change.   Respiratory: Negative.   Cardiovascular: Negative.   Gastrointestinal: Negative.       Objective:   Physical Exam  Constitutional: He appears well-developed and well-nourished.  HENT:  Head: Normocephalic and atraumatic.  Right Ear: External ear normal.  Left Ear: External ear normal.  Nose with erythema of the turbinates, oropharynx with mild erythema, no drainage.   Eyes: EOM are normal.  Neck: Normal range of motion.  Cardiovascular: Normal rate and regular rhythm.   Pulmonary/Chest: Effort normal and breath sounds normal. No respiratory distress. He has no wheezes. He has no rales.  Abdominal: Soft. Bowel sounds are normal.   Filed Vitals:   12/26/13 1313  BP: 156/88  Pulse: 56  Temp: 98 F (36.7 C)  TempSrc: Oral  Resp: 14  Height: 6' (1.829 m)  Weight: 189 lb 9.6 oz (86.002 kg)  SpO2: 97%      Assessment & Plan:

## 2014-01-07 ENCOUNTER — Other Ambulatory Visit (INDEPENDENT_AMBULATORY_CARE_PROVIDER_SITE_OTHER): Payer: 59

## 2014-01-07 ENCOUNTER — Other Ambulatory Visit: Payer: Self-pay | Admitting: Internal Medicine

## 2014-01-07 ENCOUNTER — Encounter: Payer: Self-pay | Admitting: Internal Medicine

## 2014-01-07 ENCOUNTER — Ambulatory Visit (INDEPENDENT_AMBULATORY_CARE_PROVIDER_SITE_OTHER): Payer: 59 | Admitting: Internal Medicine

## 2014-01-07 VITALS — BP 140/90 | HR 63 | Temp 97.9°F | Resp 12 | Ht 70.5 in | Wt 188.0 lb

## 2014-01-07 DIAGNOSIS — Z0189 Encounter for other specified special examinations: Secondary | ICD-10-CM

## 2014-01-07 DIAGNOSIS — E785 Hyperlipidemia, unspecified: Secondary | ICD-10-CM

## 2014-01-07 DIAGNOSIS — R739 Hyperglycemia, unspecified: Secondary | ICD-10-CM

## 2014-01-07 DIAGNOSIS — Z23 Encounter for immunization: Secondary | ICD-10-CM

## 2014-01-07 DIAGNOSIS — Z0000 Encounter for general adult medical examination without abnormal findings: Secondary | ICD-10-CM

## 2014-01-07 LAB — CBC WITH DIFFERENTIAL/PLATELET
Basophils Absolute: 0 10*3/uL (ref 0.0–0.1)
Basophils Relative: 0.3 % (ref 0.0–3.0)
Eosinophils Absolute: 0.1 10*3/uL (ref 0.0–0.7)
Eosinophils Relative: 1 % (ref 0.0–5.0)
HCT: 42.6 % (ref 39.0–52.0)
Hemoglobin: 14.2 g/dL (ref 13.0–17.0)
Lymphocytes Relative: 26.2 % (ref 12.0–46.0)
Lymphs Abs: 2.2 10*3/uL (ref 0.7–4.0)
MCHC: 33.2 g/dL (ref 30.0–36.0)
MCV: 93.9 fl (ref 78.0–100.0)
Monocytes Absolute: 0.6 10*3/uL (ref 0.1–1.0)
Monocytes Relative: 6.5 % (ref 3.0–12.0)
Neutro Abs: 5.6 10*3/uL (ref 1.4–7.7)
Neutrophils Relative %: 66 % (ref 43.0–77.0)
Platelets: 230 10*3/uL (ref 150.0–400.0)
RBC: 4.54 Mil/uL (ref 4.22–5.81)
RDW: 12.5 % (ref 11.5–15.5)
WBC: 8.5 10*3/uL (ref 4.0–10.5)

## 2014-01-07 LAB — BASIC METABOLIC PANEL
BUN: 18 mg/dL (ref 6–23)
CO2: 28 mEq/L (ref 19–32)
Calcium: 9.2 mg/dL (ref 8.4–10.5)
Chloride: 101 mEq/L (ref 96–112)
Creatinine, Ser: 0.9 mg/dL (ref 0.4–1.5)
GFR: 88.66 mL/min (ref >60.00)
Glucose, Bld: 89 mg/dL (ref 70–99)
Potassium: 4 mEq/L (ref 3.5–5.1)
Sodium: 137 mEq/L (ref 135–145)

## 2014-01-07 LAB — HEPATIC FUNCTION PANEL
ALT: 22 U/L (ref 0–53)
AST: 28 U/L (ref 0–37)
Albumin: 4.5 g/dL (ref 3.5–5.2)
Alkaline Phosphatase: 61 U/L (ref 39–117)
Bilirubin, Direct: 0.2 mg/dL (ref 0.0–0.3)
Total Bilirubin: 2 mg/dL — ABNORMAL HIGH (ref 0.2–1.2)
Total Protein: 7.6 g/dL (ref 6.0–8.3)

## 2014-01-07 LAB — TSH: TSH: 2.98 u[IU]/mL (ref 0.35–4.50)

## 2014-01-07 MED ORDER — ZOSTER VACCINE LIVE 19400 UNT/0.65ML ~~LOC~~ SOLR: 0.6500 mL | Freq: Once | SUBCUTANEOUS | Status: DC

## 2014-01-07 NOTE — Progress Notes (Signed)
Pre visit review using our clinic review tool, if applicable. No additional management support is needed unless otherwise documented below in the visit note. 

## 2014-01-07 NOTE — Progress Notes (Signed)
   Subjective:    Patient ID: Douglas Johnston, male    DOB: 06/09/1945, 68 y.o.   MRN: [id number]  HPI  He is here for a physical;acute issues denied.   He is on a heart healthy, low-sodium diet. He exercises 5 hours a week without associated cardiopulmonary symptoms  He is on low-dose HCTZ 12.5 mg daily. He is not monitoring his blood pressure.  Colonoscopy is up-to-date until 2018. He has no active GI symptoms .  Review of Systems   Chest pain, palpitations, tachycardia, exertional dyspnea, paroxysmal nocturnal dyspnea, claudication or edema are absent.  Unexplained weight loss, abdominal pain, significant dyspepsia, dysphagia, melena, rectal bleeding, or persistently small caliber stools are denied.     Objective:   Physical Exam Pertinent or positive findings include: There is a very faint right carotid rumble which is difficult to hear. He also has a very faint grade 1/2 systolic murmur at the apex intermittently. He has minimal crepitus of the knees without effusion.  Gen.: Healthy and well-nourished in appearance. Alert, appropriate and cooperative throughout exam. Appears younger than stated age  Head: Normocephalic without obvious abnormalities;  pattern alopecia  Eyes: No corneal or conjunctival inflammation noted. Pupils equal round reactive to light and accommodation. Extraocular motion intact.  Ears: External  ear exam reveals no significant lesions or deformities. Canals clear .TMs normal. Hearing is grossly normal bilaterally. Nose: External nasal exam reveals no deformity or inflammation. Nasal mucosa are pink and moist. No lesions or exudates noted.   Mouth: Oral mucosa and oropharynx reveal no lesions or exudates. Teeth in good repair. Neck: No deformities, masses, or tenderness noted. Range of motion decreased. Thyroid normal Lungs: Normal respiratory effort; chest expands symmetrically. Lungs are clear to auscultation without rales, wheezes, or increased work  of breathing. Heart: Normal rate and rhythm. Normal S1 and S2. No gallop, click, or rub.. Abdomen: Bowel sounds normal; abdomen soft and nontender. No masses, organomegaly or hernias noted. Genitalia: as per Dr Jeffie Pollock.                              Musculoskeletal/extremities: No deformity or scoliosis noted of  the thoracic or lumbar spine.  No clubbing, cyanosis, edema, or significant extremity  deformity noted.  Range of motion normal . Tone & strength normal. Hand joints normal Fingernail health good. Able to lie down & sit up w/o help.  Negative SLR bilaterally Vascular: Carotid, radial artery, dorsalis pedis and  posterior tibial pulses are full and equal.  Neurologic: Alert and oriented x3. Deep tendon reflexes symmetrical and normal.  Gait normal     Skin: Intact without suspicious lesions or rashes. Lymph: No cervical, axillary lymphadenopathy present. Psych: Mood and affect are normal. Normally interactive                                                                                     Assessment & Plan:  #1 comprehensive physical exam; no acute findings  Plan: see Orders  & Recommendations

## 2014-01-07 NOTE — Patient Instructions (Signed)
Your next office appointment will be determined based upon review of your pending labs . Those instructions will be transmitted to you through My Chart   Minimal Blood Pressure Goal= AVERAGE < 140/90;  Ideal is an AVERAGE < 135/85. This AVERAGE should be calculated from @ least 5-7 BP readings taken @ different times of day on different days of week. You should not respond to isolated BP readings , but rather the AVERAGE for that week .Please bring your  blood pressure cuff to office visits to verify that it is reliable.It  can also be checked against the blood pressure device at the pharmacy. Finger or wrist cuffs are not dependable; an arm cuff is.  Take the EKG to any emergency room or preop visits. There are nonspecific changes; as long as there is no new change these are not clinically significant . If the old EKG is not available for comparison; it may result in unnecessary hospitalization for observation with significant unnecessary expense.

## 2014-01-09 LAB — NMR LIPOPROFILE WITH LIPIDS
Cholesterol, Total: 186 mg/dL (ref 100–199)
HDL Particle Number: 31.1 umol/L (ref >=30.5)
HDL Size: 9.2 nm (ref >=9.2)
HDL-C: 56 mg/dL (ref >39)
LDL (calc): 117 mg/dL — ABNORMAL HIGH (ref 0–99)
LDL Particle Number: 1581 nmol/L — ABNORMAL HIGH (ref <1000)
LDL Size: 20.9 nm (ref >=20.8)
LP-IR Score: 46 — ABNORMAL HIGH (ref <=45)
Large HDL-P: 7.1 umol/L (ref >=4.8)
Large VLDL-P: 2.7 nmol/L (ref <=2.7)
Small LDL Particle Number: 785 nmol/L — ABNORMAL HIGH (ref <=527)
Triglycerides: 64 mg/dL (ref 0–149)
VLDL Size: 45.6 nm (ref <=46.6)

## 2014-03-02 DIAGNOSIS — D2372 Other benign neoplasm of skin of left lower limb, including hip: Secondary | ICD-10-CM | POA: Diagnosis not present

## 2014-03-13 DIAGNOSIS — M19012 Primary osteoarthritis, left shoulder: Secondary | ICD-10-CM | POA: Diagnosis not present

## 2014-03-13 DIAGNOSIS — M65351 Trigger finger, right little finger: Secondary | ICD-10-CM | POA: Diagnosis not present

## 2014-03-17 DIAGNOSIS — E291 Testicular hypofunction: Secondary | ICD-10-CM | POA: Diagnosis not present

## 2014-03-20 DIAGNOSIS — H26491 Other secondary cataract, right eye: Secondary | ICD-10-CM | POA: Diagnosis not present

## 2014-03-25 DIAGNOSIS — N5201 Erectile dysfunction due to arterial insufficiency: Secondary | ICD-10-CM | POA: Diagnosis not present

## 2014-03-25 DIAGNOSIS — E291 Testicular hypofunction: Secondary | ICD-10-CM | POA: Diagnosis not present

## 2014-03-25 DIAGNOSIS — N486 Induration penis plastica: Secondary | ICD-10-CM | POA: Diagnosis not present

## 2014-04-02 ENCOUNTER — Other Ambulatory Visit: Payer: Self-pay | Admitting: Internal Medicine

## 2014-04-29 DIAGNOSIS — E291 Testicular hypofunction: Secondary | ICD-10-CM | POA: Diagnosis not present

## 2014-05-01 DIAGNOSIS — M2242 Chondromalacia patellae, left knee: Secondary | ICD-10-CM | POA: Diagnosis not present

## 2014-05-22 DIAGNOSIS — Z961 Presence of intraocular lens: Secondary | ICD-10-CM | POA: Diagnosis not present

## 2014-07-06 ENCOUNTER — Other Ambulatory Visit: Payer: Self-pay

## 2014-07-17 DIAGNOSIS — M65351 Trigger finger, right little finger: Secondary | ICD-10-CM | POA: Diagnosis not present

## 2014-09-21 DIAGNOSIS — M4726 Other spondylosis with radiculopathy, lumbar region: Secondary | ICD-10-CM | POA: Diagnosis not present

## 2014-09-30 ENCOUNTER — Other Ambulatory Visit: Payer: Self-pay | Admitting: Internal Medicine

## 2014-10-07 DIAGNOSIS — M2242 Chondromalacia patellae, left knee: Secondary | ICD-10-CM | POA: Diagnosis not present

## 2014-10-13 DIAGNOSIS — M25562 Pain in left knee: Secondary | ICD-10-CM | POA: Diagnosis not present

## 2014-10-13 DIAGNOSIS — M2242 Chondromalacia patellae, left knee: Secondary | ICD-10-CM | POA: Diagnosis not present

## 2014-10-14 DIAGNOSIS — M25562 Pain in left knee: Secondary | ICD-10-CM | POA: Diagnosis not present

## 2014-10-14 DIAGNOSIS — M2242 Chondromalacia patellae, left knee: Secondary | ICD-10-CM | POA: Diagnosis not present

## 2014-10-20 DIAGNOSIS — M2242 Chondromalacia patellae, left knee: Secondary | ICD-10-CM | POA: Diagnosis not present

## 2014-10-20 DIAGNOSIS — M25562 Pain in left knee: Secondary | ICD-10-CM | POA: Diagnosis not present

## 2014-10-22 DIAGNOSIS — M25562 Pain in left knee: Secondary | ICD-10-CM | POA: Diagnosis not present

## 2014-10-22 DIAGNOSIS — M2242 Chondromalacia patellae, left knee: Secondary | ICD-10-CM | POA: Diagnosis not present

## 2014-10-26 DIAGNOSIS — M25562 Pain in left knee: Secondary | ICD-10-CM | POA: Diagnosis not present

## 2014-10-26 DIAGNOSIS — M2242 Chondromalacia patellae, left knee: Secondary | ICD-10-CM | POA: Diagnosis not present

## 2014-10-28 DIAGNOSIS — M25562 Pain in left knee: Secondary | ICD-10-CM | POA: Diagnosis not present

## 2014-10-28 DIAGNOSIS — M2242 Chondromalacia patellae, left knee: Secondary | ICD-10-CM | POA: Diagnosis not present

## 2014-11-02 DIAGNOSIS — Z23 Encounter for immunization: Secondary | ICD-10-CM | POA: Diagnosis not present

## 2014-11-02 DIAGNOSIS — M2242 Chondromalacia patellae, left knee: Secondary | ICD-10-CM | POA: Diagnosis not present

## 2014-11-02 DIAGNOSIS — M25562 Pain in left knee: Secondary | ICD-10-CM | POA: Diagnosis not present

## 2014-11-04 DIAGNOSIS — M2242 Chondromalacia patellae, left knee: Secondary | ICD-10-CM | POA: Diagnosis not present

## 2014-11-04 DIAGNOSIS — M25562 Pain in left knee: Secondary | ICD-10-CM | POA: Diagnosis not present

## 2014-11-16 DIAGNOSIS — Z961 Presence of intraocular lens: Secondary | ICD-10-CM | POA: Diagnosis not present

## 2014-12-25 ENCOUNTER — Ambulatory Visit (INDEPENDENT_AMBULATORY_CARE_PROVIDER_SITE_OTHER): Payer: Medicare Other | Admitting: Internal Medicine

## 2014-12-25 ENCOUNTER — Other Ambulatory Visit (INDEPENDENT_AMBULATORY_CARE_PROVIDER_SITE_OTHER): Payer: Medicare Other

## 2014-12-25 ENCOUNTER — Encounter: Payer: Self-pay | Admitting: Internal Medicine

## 2014-12-25 VITALS — BP 130/92 | HR 60 | Temp 97.9°F | Ht 71.0 in | Wt 180.0 lb

## 2014-12-25 DIAGNOSIS — K5730 Diverticulosis of large intestine without perforation or abscess without bleeding: Secondary | ICD-10-CM

## 2014-12-25 DIAGNOSIS — Z23 Encounter for immunization: Secondary | ICD-10-CM | POA: Diagnosis not present

## 2014-12-25 DIAGNOSIS — E785 Hyperlipidemia, unspecified: Secondary | ICD-10-CM

## 2014-12-25 DIAGNOSIS — Z0000 Encounter for general adult medical examination without abnormal findings: Secondary | ICD-10-CM

## 2014-12-25 DIAGNOSIS — R739 Hyperglycemia, unspecified: Secondary | ICD-10-CM | POA: Diagnosis not present

## 2014-12-25 DIAGNOSIS — I10 Essential (primary) hypertension: Secondary | ICD-10-CM

## 2014-12-25 DIAGNOSIS — Z1159 Encounter for screening for other viral diseases: Secondary | ICD-10-CM

## 2014-12-25 LAB — LIPID PANEL
Cholesterol: 164 mg/dL (ref 0–200)
HDL: 46.8 mg/dL (ref >39.00)
LDL Cholesterol: 104 mg/dL — ABNORMAL HIGH (ref 0–99)
NonHDL: 117.25
Total CHOL/HDL Ratio: 4
Triglycerides: 67 mg/dL (ref 0.0–149.0)
VLDL: 13.4 mg/dL (ref 0.0–40.0)

## 2014-12-25 LAB — CBC WITH DIFFERENTIAL/PLATELET
Basophils Absolute: 0.1 10*3/uL (ref 0.0–0.1)
Basophils Relative: 0.7 % (ref 0.0–3.0)
Eosinophils Absolute: 0.4 10*3/uL (ref 0.0–0.7)
Eosinophils Relative: 5 % (ref 0.0–5.0)
HCT: 42.1 % (ref 39.0–52.0)
Hemoglobin: 14.1 g/dL (ref 13.0–17.0)
Lymphocytes Relative: 26.5 % (ref 12.0–46.0)
Lymphs Abs: 2.2 10*3/uL (ref 0.7–4.0)
MCHC: 33.3 g/dL (ref 30.0–36.0)
MCV: 94 fl (ref 78.0–100.0)
Monocytes Absolute: 0.5 10*3/uL (ref 0.1–1.0)
Monocytes Relative: 6 % (ref 3.0–12.0)
Neutro Abs: 5.1 10*3/uL (ref 1.4–7.7)
Neutrophils Relative %: 61.8 % (ref 43.0–77.0)
Platelets: 282 10*3/uL (ref 150.0–400.0)
RBC: 4.48 Mil/uL (ref 4.22–5.81)
RDW: 12.7 % (ref 11.5–15.5)
WBC: 8.3 10*3/uL (ref 4.0–10.5)

## 2014-12-25 LAB — BASIC METABOLIC PANEL
BUN: 15 mg/dL (ref 6–23)
CO2: 30 mEq/L (ref 19–32)
Calcium: 9.5 mg/dL (ref 8.4–10.5)
Chloride: 102 mEq/L (ref 96–112)
Creatinine, Ser: 0.97 mg/dL (ref 0.40–1.50)
GFR: 81.1 mL/min (ref >60.00)
Glucose, Bld: 99 mg/dL (ref 70–99)
Potassium: 4.8 mEq/L (ref 3.5–5.1)
Sodium: 139 mEq/L (ref 135–145)

## 2014-12-25 LAB — HEPATIC FUNCTION PANEL
ALT: 12 U/L (ref 0–53)
AST: 17 U/L (ref 0–37)
Albumin: 4.3 g/dL (ref 3.5–5.2)
Alkaline Phosphatase: 60 U/L (ref 39–117)
Bilirubin, Direct: 0.2 mg/dL (ref 0.0–0.3)
Total Bilirubin: 1.1 mg/dL (ref 0.2–1.2)
Total Protein: 7.4 g/dL (ref 6.0–8.3)

## 2014-12-25 LAB — TSH: TSH: 4.38 u[IU]/mL (ref 0.35–4.50)

## 2014-12-25 MED ORDER — HYDROCHLOROTHIAZIDE 25 MG PO TABS: ORAL_TABLET | ORAL | Status: DC

## 2014-12-25 NOTE — Assessment & Plan Note (Signed)
CBC

## 2014-12-25 NOTE — Assessment & Plan Note (Signed)
A1c

## 2014-12-25 NOTE — Patient Instructions (Signed)
Minimal Blood Pressure Goal= AVERAGE < 140/90;  Ideal is an AVERAGE < 135/85. This AVERAGE should be calculated from @ least 5-7 BP readings taken @ different times of day on different days of week. You should not respond to isolated BP readings , but rather the AVERAGE for that week .Please bring your  blood pressure cuff to office visits to verify that it is reliable.It  can also be checked against the blood pressure device at the pharmacy. Finger or wrist cuffs are not dependable; an arm cuff is. Your next office appointment will be determined based upon review of your pending labs .  Those written interpretation of the lab results and instructions will be transmitted to you by My Chart   Critical results will be called.   Followup as needed for any active or acute issue. Please report any significant change in your symptoms. 

## 2014-12-25 NOTE — Progress Notes (Signed)
Pre visit review using our clinic review tool, if applicable. No additional management support is needed unless otherwise documented below in the visit note. 

## 2014-12-25 NOTE — Assessment & Plan Note (Signed)
Lipids, LFTs, TSH  

## 2014-12-25 NOTE — Progress Notes (Signed)
   Subjective:    Patient ID: Douglas Johnston, male    DOB: [birth year], 69 y.o.   MRN: [id number]  HPI Medicare Wellness Visit: Psychosocial and medical history were reviewed as required by Medicare (history related to abuse, antisocial behavior , firearm risk). Social history: Caffeine: One cup per day Alcohol:  One drink per week Tobacco use: No Exercise: 6-8 hours per week at high level Personal safety/fall risk: No Limitations of activities of daily living: No Seatbelt/ smoke alarm use: Yes Healthcare Power of Attorney/Living Will status and End of Life process assessment : Up-to-date Ophthalmologic exam status: Up-to-date Hearing evaluation status: See below Orientation: Oriented X 3 Memory and recall: Good Spelling or math testing: Excellent Depression/anxiety assessment: No Foreign travel history: [place] February 2016 Immunization status for influenza/pneumonia/ shingles /tetanus: Pneumococcal vaccine update needed Transfusion history: Never Preventive health care maintenance status: Colonoscopy as per protocol/standard care: Up-to-date Dental care: At least 3 times a year Chart reviewed and updated. Active issues reviewed and addressed as documented below.  He is on a heart healthy diet. Exercises described above.  He has lost 8 pounds which he attributes to healthy lifestyle changes. He's also been off his testosterone for several months as it was not felt to be efficacious.  He has occasional "pressure" along the lateral aspect of the left lower extremity @ night which he feels is related to spinal issues. He has no other neuromuscular symptoms.  He does have nocturia once nightly  Review of Systems  Chest pain, palpitations, tachycardia, exertional dyspnea, paroxysmal nocturnal dyspnea, claudication or edema are absent. No unexplained weight loss, abdominal pain, significant dyspepsia, dysphagia, melena, rectal bleeding, or persistently small caliber  stools. Dysuria, pyuria, hematuria, frequency,  or polyuria are denied. Change in hair, skin, nails denied. No bowel changes of constipation or diarrhea. No intolerance to heat or cold.      Objective:   Physical Exam  Pertinent or positive findings include: Mustache and pattern alopecia present. He has bilateral ptosis. Hearing is slightly decreased bilaterally. There is decreased range of motion of the cervical spine. He has mild crepitus of the knees. GU/digital rectal exam deferred to Dr. Jeffie Pollock.  General appearance :adequately nourished; in no distress.  Eyes: No conjunctival inflammation or scleral icterus is present.  Oral exam:  Lips and gums are healthy appearing.There is no oropharyngeal erythema or exudate noted. Dental hygiene is good.  Heart:  Normal rate and regular rhythm. S1 and S2 normal without gallop, murmur, click, rub or other extra sounds    Lungs:Chest clear to auscultation; no wheezes, rhonchi,rales ,or rubs present.No increased work of breathing.   Abdomen: bowel sounds normal, soft and non-tender without masses, organomegaly or hernias noted.  No guarding or rebound.   Vascular : all pulses equal ; no bruits present.  Skin:Warm & dry.  Intact without suspicious lesions or rashes ; no tenting or jaundice   Lymphatic: No lymphadenopathy is noted about the head, neck, axilla  Neuro: Strength, tone & DTRs normal.       Assessment & Plan:  #1 Medicare Wellness Exam; criteria met ; data entered #2 See Current Assessment & Plan in Problem List under specific Diagnosis. The labs will be reviewed and risks and options assessed.  Written recommendations will be provided by mail or directly through My Chart. Further evaluation or change in medical therapy will be directed by those results.

## 2014-12-26 LAB — HEPATITIS C ANTIBODY: HCV Ab: NEGATIVE

## 2015-01-10 HISTORY — PX: COLONOSCOPY: SHX174

## 2015-04-06 ENCOUNTER — Ambulatory Visit (INDEPENDENT_AMBULATORY_CARE_PROVIDER_SITE_OTHER): Payer: Medicare Other | Admitting: Internal Medicine

## 2015-04-06 ENCOUNTER — Encounter: Payer: Self-pay | Admitting: Internal Medicine

## 2015-04-06 VITALS — BP 150/92 | HR 67 | Temp 98.4°F | Resp 16 | Wt 183.0 lb

## 2015-04-06 DIAGNOSIS — J0100 Acute maxillary sinusitis, unspecified: Secondary | ICD-10-CM | POA: Diagnosis not present

## 2015-04-06 DIAGNOSIS — I10 Essential (primary) hypertension: Secondary | ICD-10-CM | POA: Diagnosis not present

## 2015-04-06 MED ORDER — AMOXICILLIN 500 MG PO CAPS: 500.0000 mg | ORAL_CAPSULE | Freq: Three times a day (TID) | ORAL | Status: DC

## 2015-04-06 MED ORDER — HYDROCODONE-HOMATROPINE 5-1.5 MG/5ML PO SYRP: 5.0000 mL | ORAL_SOLUTION | Freq: Three times a day (TID) | ORAL | Status: DC | PRN

## 2015-04-06 NOTE — Progress Notes (Signed)
Subjective:    Patient ID: Douglas Johnston., male    DOB: 14-Jun-1945, 69 y.o.   MRN: [id number]  HPI He is here for an acute visit for cold symptoms.  His symptoms started about 5 days ago. He has a lot of nasal congestion with thick green mucus, PND, sinus pressure, fever, dry cough and headaches.  He had a sore throat but that has resolved.    He has taken mucinex and mucinex- dm and took amoxicillin Saturday and Sunday that he had leftover from his dentist.  He thinks the antibiotic may have helped slightly.   His blood pressure was elevated yesterday when he was at the urologists office.  He is taking his medication.  He has not been monitoring his BP at home.  He wonders if the mucinex dm is the cause.   Medications and allergies reviewed with patient and updated if appropriate.  Patient Active Problem List   Diagnosis Date Noted  . Right carotid bruit 12/28/2012  . Hyperglycemia 10/16/2012  . 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 HYPERTENSION 01/26/2009  . ELECTROCARDIOGRAM, ABNORMAL 01/26/2009  . SKIN CANCER, HX OF 01/26/2009  . ATRIAL SEPTAL DEFECT, HX OF 01/26/2009  . Hyperlipidemia 08/13/2006  . 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 Diverticulosis of large intestine 07/27/2006    Current Outpatient Prescriptions on File Prior to Visit  Medication Sig Dispense Refill  . desonide (DESOWEN) 0.05 % lotion Apply 1 application topically daily as needed.    . fexofenadine (ALLEGRA) 180 MG tablet Take 180 mg by mouth daily.    . hydrochlorothiazide (HYDRODIURIL) 25 MG tablet TAKE (1/2) TABLET DAILY. 45 tablet 3  . hydrocortisone 2.5 % lotion as needed.     No current facility-administered medications on file prior to visit.    Past Medical History  Diagnosis Date  . Diverticulosis of colon 2008  . External hemorrhoids   . Gilbert's syndrome   . BPH (benign prostatic hyperplasia)   . Hyperlipidemia   . Hypertension   . Skin cancer      X2 in [place]; now seeing Dr Jarome Matin    Past Surgical History  Procedure Laterality Date  . Lumbar laminectomy  [place]  . Tonsillectomy    . Inguinal hernia repair    . Asd repair  [place]  . Colonoscopy  2008    [facility]  . Cataract extraction  July 2015    bilateral eyes; Dr Thyra Breed, [facility]    Social History   Social History  . Marital Status: Married    Spouse Name: N/A  . Number of Children: N/A  . Years of Education: N/A   Social History Main Topics  . Smoking status: Never Smoker   . Smokeless tobacco: Never Used  . Alcohol Use: Yes     Comment:  1-2 every 3 weeks  . Drug Use: No  . Sexual Activity: Not on file   Other Topics Concern  . Not on file   Social History Narrative    Family History  Problem Relation Age of Onset  . Hypertension Mother   . Emphysema Father     smoker  . Cancer Neg Hx   . Diabetes Neg Hx   . Heart disease Neg Hx   . Stroke Neg Hx     Review of Systems  Constitutional: Positive for fever (100.4).  HENT: Positive for congestion, postnasal  drip, sinus pressure and sore throat (first couple of days only). Negative for ear pain.   Respiratory: Positive for cough (dry). Negative for shortness of breath and wheezing.   Cardiovascular: Negative for chest pain.  Gastrointestinal: Negative for nausea.  Musculoskeletal: Negative for myalgias.  Neurological: Positive for headaches. Negative for dizziness and light-headedness.       Objective:   Filed Vitals:   04/06/15 1119  BP: 150/92  Pulse: 67  Temp: 98.4 F (36.9 C)  Resp: 16   Filed Weights   04/06/15 1119  Weight: 183 lb (83.008 kg)   Body mass index is 25.53 kg/(m^2).   Physical Exam GENERAL APPEARANCE: Appears stated age, well appearing, NAD EYES: conjunctiva clear, no icterus HEENT: bilateral tympanic membranes and ear canals normal, oropharynx with mild erythema, right> left maxillary sinus tenderness, no thyromegaly,  trachea midline, no cervical or supraclavicular lymphadenopathy LUNGS: Clear to auscultation without wheeze or crackles, unlabored breathing, good air entry bilaterally HEART: Normal S1,S2 without murmurs EXTREMITIES: Without clubbing, cyanosis, or edema      Assessment & Plan:   Acute sinus infection Has already taken amoxicillin Will continue amoxicillin for an additional 7 days Continue mucinex, avoid mucinex-dm flonase or saline nasal spray Rest, fluids Call if no improvement  See Problem List for Assessment and Plan of chronic medical problems.

## 2015-04-06 NOTE — Progress Notes (Signed)
Pre visit review using our clinic review tool, if applicable. No additional management support is needed unless otherwise documented below in the visit note. 

## 2015-04-06 NOTE — Patient Instructions (Signed)

## 2015-04-06 NOTE — Assessment & Plan Note (Signed)
Elevated here today - may be related to mucinex dm or being sick, but I am concerned his BP is not controlled He will start monitoring it at home Follow up soon if BP is not controlled

## 2015-04-12 ENCOUNTER — Telehealth: Payer: Self-pay | Admitting: *Deleted

## 2015-04-12 MED ORDER — DOXYCYCLINE HYCLATE 100 MG PO TABS: 100.0000 mg | ORAL_TABLET | Freq: Two times a day (BID) | ORAL | Status: DC

## 2015-04-12 NOTE — Telephone Encounter (Signed)
Stop amoxicillin.  Doxycycline was sent to his pharmacy

## 2015-04-12 NOTE — Telephone Encounter (Signed)
Left msg on triage stating saw Dr. Quay Burow last week she had rx antibiotic whic tomorrow will be the last day, but husband is not better at all still coughing and wheezing. Having a lot of green nasal mucus, and not able to sleep at night due to cough. Wanting to know can he get another refill or have md rx something a lil stronger...Johny Chess

## 2015-04-13 NOTE — Telephone Encounter (Signed)
Notified pt w/MD response.../lmb 

## 2015-04-15 ENCOUNTER — Encounter: Payer: Self-pay | Admitting: Internal Medicine

## 2015-04-15 ENCOUNTER — Ambulatory Visit (INDEPENDENT_AMBULATORY_CARE_PROVIDER_SITE_OTHER): Payer: Medicare Other | Admitting: Internal Medicine

## 2015-04-15 VITALS — BP 164/94 | HR 60 | Temp 97.5°F | Resp 16 | Wt 182.0 lb

## 2015-04-15 DIAGNOSIS — R05 Cough: Secondary | ICD-10-CM | POA: Diagnosis not present

## 2015-04-15 DIAGNOSIS — R059 Cough, unspecified: Secondary | ICD-10-CM

## 2015-04-15 DIAGNOSIS — J0100 Acute maxillary sinusitis, unspecified: Secondary | ICD-10-CM

## 2015-04-15 MED ORDER — BENZONATATE 200 MG PO CAPS: 200.0000 mg | ORAL_CAPSULE | Freq: Three times a day (TID) | ORAL | Status: DC | PRN

## 2015-04-15 MED ORDER — HYDROCODONE-HOMATROPINE 5-1.5 MG/5ML PO SYRP: 5.0000 mL | ORAL_SOLUTION | Freq: Three times a day (TID) | ORAL | Status: DC | PRN

## 2015-04-15 NOTE — Progress Notes (Signed)
Subjective:    Patient ID: Douglas Hammond., male    DOB: [birth year], 70 y.o.   MRN: [id number]  HPI He is here for an acute visit for sinus congestion.  He was seen here on 3/28 for possible sinus infection.  He had already taken a couple of days of amoxicillin he had at home and felt a little better.  I prescribed 7 additional days of amoxicillin.  He called three days ago and had not felt any better.  He was coughing and wheezing and had a lot of green nasal mucus.  We stopped the amoxicillin and started him on doxycycline.    He feels better since starting on the doxycycline.  He is still getting some green nasal discharge, but it is getting less. He is still taking the netti pot, mucinex and flonase.  He is using the cough syrup.  Any activity starts the coughing.  He has a post nasal drip.  He denies nasal congestion.  His cough is dry.  His head feels foggy.    Medications and allergies reviewed with patient and updated if appropriate.  Patient Active Problem List   Diagnosis Date Noted  . Right carotid bruit 12/28/2012  . Hyperglycemia 10/16/2012  . 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 Essential hypertension 01/26/2009  . ELECTROCARDIOGRAM, ABNORMAL 01/26/2009  . SKIN CANCER, HX OF 01/26/2009  . ATRIAL SEPTAL DEFECT, HX OF 01/26/2009  . Hyperlipidemia 08/13/2006  . 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 Diverticulosis of large intestine 07/27/2006    Current Outpatient Prescriptions on File Prior to Visit  Medication Sig Dispense Refill  . desonide (DESOWEN) 0.05 % lotion Apply 1 application topically daily as needed.    . doxycycline (VIBRA-TABS) 100 MG tablet Take 1 tablet (100 mg total) by mouth 2 (two) times daily. 20 tablet 0  . fexofenadine (ALLEGRA) 180 MG tablet Take 180 mg by mouth daily.    . hydrochlorothiazide (HYDRODIURIL) 25 MG tablet TAKE (1/2) TABLET DAILY. 45 tablet 3  . HYDROcodone-homatropine (HYCODAN) 5-1.5 MG/5ML  syrup Take 5 mLs by mouth every 8 (eight) hours as needed for cough. 120 mL 0  . hydrocortisone 2.5 % lotion as needed.     No current facility-administered medications on file prior to visit.    Past Medical History  Diagnosis Date  . Diverticulosis of colon 2008  . External hemorrhoids   . Gilbert's syndrome   . BPH (benign prostatic hyperplasia)   . Hyperlipidemia   . Hypertension   . Skin cancer     X2 in [place]; now seeing Dr Jarome Matin    Past Surgical History  Procedure Laterality Date  . Lumbar laminectomy  [place]  . Tonsillectomy    . Inguinal hernia repair    . Asd repair  [place]  . Colonoscopy  2008    [facility]  . Cataract extraction  July 2015    bilateral eyes; Dr Thyra Breed, [facility]    Social History   Social History  . Marital Status: Married    Spouse Name: N/A  . Number of Children: N/A  . Years of Education: N/A   Social History Main Topics  . Smoking status: Never Smoker   . Smokeless tobacco: Never Used  . Alcohol Use: Yes     Comment:  1-2 every 3 weeks  . Drug Use: No  . Sexual Activity: Not Asked  Other Topics Concern  . None   Social History Narrative    Family History  Problem Relation Age of Onset  . Hypertension Mother   . Emphysema Father     smoker  . Cancer Neg Hx   . Diabetes Neg Hx   . Heart disease Neg Hx   . Stroke Neg Hx     Review of Systems  Constitutional: Negative for fever.  HENT: Positive for postnasal drip. Negative for congestion, ear pain, sinus pressure and sore throat.   Respiratory: Positive for cough (dry). Negative for shortness of breath and wheezing.   Neurological: Negative for light-headedness and headaches.       Objective:   Filed Vitals:   04/15/15 0849  BP: 164/94  Pulse: 60  Temp: 97.5 F (36.4 C)  Resp: 16   Filed Weights   04/15/15 0849  Weight: 182 lb (82.555 kg)   Body mass index is 25.4 kg/(m^2).   Physical Exam GENERAL APPEARANCE: Appears  stated age, well appearing, NAD EYES: conjunctiva clear, no icterus HEENT: bilateral tympanic membranes and ear canals normal, oropharynx with mild erythema, no thyromegaly, trachea midline, no cervical or supraclavicular lymphadenopathy LUNGS: Clear to auscultation without wheeze or crackles, unlabored breathing, good air entry bilaterally HEART: Normal S1,S2 without murmurs EXTREMITIES: Without clubbing, cyanosis, or edema      Assessment & Plan:   Acute sinus infection, cough Amoxicillin was not effective Improving on doxycycline - will continue Still with pnd and cough -- reassured will likely improve over the next few days Continue flonase, netti pot, mucinex Tessalon perles during day Hycodan as needed  Call if no improvement by [employer]

## 2015-04-15 NOTE — Patient Instructions (Signed)
We will try tessalon perles for your cough during the day - they will not make you tired.   A prescription for cough syrup was also given.    Call if no improvement.

## 2015-04-15 NOTE — Progress Notes (Signed)
Pre visit review using our clinic review tool, if applicable. No additional management support is needed unless otherwise documented below in the visit note. 

## 2015-05-18 ENCOUNTER — Encounter: Payer: Self-pay | Admitting: Internal Medicine

## 2015-05-18 ENCOUNTER — Ambulatory Visit (INDEPENDENT_AMBULATORY_CARE_PROVIDER_SITE_OTHER): Payer: Medicare Other | Admitting: Internal Medicine

## 2015-05-18 VITALS — BP 136/84 | HR 76 | Temp 98.2°F | Resp 16 | Wt 182.0 lb

## 2015-05-18 DIAGNOSIS — J320 Chronic maxillary sinusitis: Secondary | ICD-10-CM | POA: Insufficient documentation

## 2015-05-18 DIAGNOSIS — I10 Essential (primary) hypertension: Secondary | ICD-10-CM | POA: Diagnosis not present

## 2015-05-18 DIAGNOSIS — J0101 Acute recurrent maxillary sinusitis: Secondary | ICD-10-CM

## 2015-05-18 MED ORDER — DOXYCYCLINE HYCLATE 100 MG PO TABS: 100.0000 mg | ORAL_TABLET | Freq: Two times a day (BID) | ORAL | Status: DC

## 2015-05-18 NOTE — Patient Instructions (Signed)
Doxycycline was sent to your pharmacy for your sinus infection. .   All other Health Maintenance issues reviewed.   All recommended immunizations and age-appropriate screenings are up-to-date or discussed.  No immunizations administered today.   Medications reviewed and updated.   No changes recommended at this time.  Your prescription(s) have been submitted to your pharmacy. Please take as directed and contact our office if you believe you are having problem(s) with the medication(s).

## 2015-05-18 NOTE — Assessment & Plan Note (Signed)
Not completely resolved  Amoxicillin - was not effective Doxycycline helped - will treat again with doxycyline  Use flonase Continue allegra daily Call if no improvement

## 2015-05-18 NOTE — Progress Notes (Signed)
Pre visit review using our clinic review tool, if applicable. No additional management support is needed unless otherwise documented below in the visit note. 

## 2015-05-18 NOTE — Progress Notes (Signed)
Subjective:    Patient ID: Douglas Johnston., male    DOB: 05-03-1945, 70 y.o.   MRN: [id number]  HPI He is here to establish with a new pcp.   He is here for follow up.  Hypertension: He is taking his medication daily. He is compliant with a low sodium diet.  He denies chest pain, palpitations, shortness of breath and regular headaches. He has mild edema in his right ankle that is not new.  He does have some varicose veins.  He is exercising regularly.  He does monitor his blood pressure at home, 142/80 on average.  Lowest 116/76, 156/86.   ? Sinus infection:  We treated him with two different antibiotic in the past couple of months for a sinus infection.  The first antibiotic did not help, the second one helped, but he still has some residual symptoms.  He sometimes has yellow-green nasal discharge.  He has some sinus pressure and ear pressure.  He denies fever, headache and sore throat.  He has occasional cough from the PND.  He has felt like he had water in his ear at times.  He does not feel sick, but is traveling this week and worried about a residual infection.  Medications and allergies reviewed with patient and updated if appropriate.  Patient Active Problem List   Diagnosis Date Noted  . Right carotid bruit 12/28/2012  . Hyperglycemia 10/16/2012  . 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 Essential hypertension 01/26/2009  . ELECTROCARDIOGRAM, ABNORMAL 01/26/2009  . SKIN CANCER, HX OF 01/26/2009  . ATRIAL SEPTAL DEFECT, HX OF 01/26/2009  . Hyperlipidemia 08/13/2006  . 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 Diverticulosis of large intestine 07/27/2006    Current Outpatient Prescriptions on File Prior to Visit  Medication Sig Dispense Refill  . desonide (DESOWEN) 0.05 % lotion Apply 1 application topically daily as needed.    . fexofenadine (ALLEGRA) 180 MG tablet Take 180 mg by mouth daily.    . hydrochlorothiazide (HYDRODIURIL) 25 MG tablet  TAKE (1/2) TABLET DAILY. 45 tablet 3  . hydrocortisone 2.5 % lotion as needed.     No current facility-administered medications on file prior to visit.    Past Medical History  Diagnosis Date  . Diverticulosis of colon 2008  . External hemorrhoids   . Gilbert's syndrome   . BPH (benign prostatic hyperplasia)   . Hyperlipidemia   . Hypertension   . Skin cancer     X2 in [place]; now seeing Dr Jarome Matin    Past Surgical History  Procedure Laterality Date  . Lumbar laminectomy  [place]  . Tonsillectomy    . Inguinal hernia repair    . Asd repair  [place]  . Colonoscopy  2008    [facility]  . Cataract extraction  July 2015    bilateral eyes; Dr Thyra Breed, [facility]    Social History   Social History  . Marital Status: Married    Spouse Name: N/A  . Number of Children: N/A  . Years of Education: N/A   Social History Main Topics  . Smoking status: Never Smoker   . Smokeless tobacco: Never Used  . Alcohol Use: Yes     Comment:  1-2 every 3 weeks  . Drug Use: No  . Sexual Activity: Not Asked   Other Topics Concern  . None   Social History Narrative  Family History  Problem Relation Age of Onset  . Hypertension Mother   . Emphysema Father     smoker  . Cancer Neg Hx   . Diabetes Neg Hx   . Heart disease Neg Hx   . Stroke Neg Hx     Review of Systems  Constitutional: Negative for fever and chills.  HENT: Positive for congestion, postnasal drip and sinus pressure (at times). Negative for ear pain and sneezing.   Respiratory: Positive for cough. Negative for shortness of breath and wheezing.   Cardiovascular: Positive for leg swelling (right ankle swelling - mild). Negative for chest pain and palpitations.  Neurological: Negative for light-headedness and headaches.       Objective:   Filed Vitals:   05/18/15 1401  BP: 136/84  Pulse: 76  Temp: 98.2 F (36.8 C)  Resp: 16   Filed Weights   05/18/15 1401  Weight: 182 lb  (82.555 kg)   Body mass index is 25.4 kg/(m^2).   Physical Exam Constitutional: Appears well-developed and well-nourished. No distress.  Neck: Neck supple. No tracheal deviation present. No thyromegaly present.  No carotid bruit. No cervical adenopathy.   Cardiovascular: Normal rate, regular rhythm and normal heart sounds.   No murmur heard.  No edema Pulmonary/Chest: Effort normal and breath sounds normal. No respiratory distress. No wheezes.        Assessment & Plan:   See Problem List for Assessment and Plan of chronic medical problems.  Follow up annually

## 2015-05-18 NOTE — Assessment & Plan Note (Signed)
BP on average controlled at home Continue current medication Continue to monitor at home

## 2015-07-21 ENCOUNTER — Ambulatory Visit (INDEPENDENT_AMBULATORY_CARE_PROVIDER_SITE_OTHER): Payer: Medicare Other | Admitting: Internal Medicine

## 2015-07-21 ENCOUNTER — Encounter: Payer: Self-pay | Admitting: Internal Medicine

## 2015-07-21 VITALS — BP 146/100 | HR 74 | Temp 98.9°F | Resp 16 | Wt 186.0 lb

## 2015-07-21 DIAGNOSIS — I10 Essential (primary) hypertension: Secondary | ICD-10-CM | POA: Diagnosis not present

## 2015-07-21 DIAGNOSIS — I868 Varicose veins of other specified sites: Secondary | ICD-10-CM | POA: Diagnosis not present

## 2015-07-21 DIAGNOSIS — M25471 Effusion, right ankle: Secondary | ICD-10-CM | POA: Diagnosis not present

## 2015-07-21 DIAGNOSIS — I8390 Asymptomatic varicose veins of unspecified lower extremity: Secondary | ICD-10-CM

## 2015-07-21 MED ORDER — HYDROCHLOROTHIAZIDE 25 MG PO TABS: ORAL_TABLET | ORAL | Status: DC

## 2015-07-21 NOTE — Assessment & Plan Note (Signed)
Right leg > left leg Likely related to venous insufficiency

## 2015-07-21 NOTE — Assessment & Plan Note (Addendum)
Likely related to venous insufficiency, his recent ankle injury may have contributed No evidence of systemic cause Will refer to vascular Discussed causes of increased swelling - increased salt intake, heat, standing/sitting too long Recommended compression socks, especially with travel Can take full hctz pill as needed for increased swelling

## 2015-07-21 NOTE — Assessment & Plan Note (Signed)
Elevated here today Advised him to monitor closely at home Goal is less than 150/90 Can consider increasing hctz to 25 mg daily which may also help with his right ankle swelling

## 2015-07-21 NOTE — Progress Notes (Signed)
Subjective:    Patient ID: Douglas Johnston., male    DOB: 07-Feb-1945, 70 y.o.   MRN: [id number]  HPI He is here for an acute visit.  For at least one year he has had mild right lower extremity swelling and has not had LLE edema. He denies trauma over the years or major injuries to the right leg.   The end of may he injured his right ankle playing racquetball. It was a mild injury and he was still able to play.  The anterior ankle was sore and slightly swollen for a while.  He still has some mild pain in that area.  He did not twist his ankle and there is no lateral or medial ankle pain.      Last week he traveled to the beach and was at the beach for a week, traveled to the mountains and was very active.  He has significant swelling in his right foot and ankle, minimal swelling in the left ankle/foot and intermittent swelling in his hands at times.  He did not have pain in the right ankle/foot, just discomfort from the swelling.   He has minimal swelling now in the right ankle and foot.     Medications and allergies reviewed with patient and updated if appropriate.  Patient Active Problem List   Diagnosis Date Noted  . Maxillary sinusitis 05/18/2015  . Right carotid bruit 12/28/2012  . Hyperglycemia 10/16/2012  . 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 Essential hypertension 01/26/2009  . ELECTROCARDIOGRAM, ABNORMAL 01/26/2009  . SKIN CANCER, HX OF 01/26/2009  . ATRIAL SEPTAL DEFECT, HX OF 01/26/2009  . Hyperlipidemia 08/13/2006  . 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 Diverticulosis of large intestine 07/27/2006    Current Outpatient Prescriptions on File Prior to Visit  Medication Sig Dispense Refill  . desonide (DESOWEN) 0.05 % lotion Apply 1 application topically daily as needed.    . fexofenadine (ALLEGRA) 180 MG tablet Take 180 mg by mouth daily.    . hydrocortisone 2.5 % lotion as needed.    . Misc Natural Products (OSTEO BI-FLEX JOINT  SHIELD PO) Take by mouth daily.    . Multiple Vitamins-Minerals (CENTRUM SILVER PO) Take by mouth daily.    . Probiotic Product (PROBIOTIC FORMULA PO) Take by mouth daily. Align    . TURMERIC PO Take by mouth daily.    Marland Kitchen VITAMIN E PO Take by mouth daily.     No current facility-administered medications on file prior to visit.    Past Medical History  Diagnosis Date  . Diverticulosis of colon 2008  . External hemorrhoids   . Gilbert's syndrome   . BPH (benign prostatic hyperplasia)   . Hyperlipidemia   . Hypertension   . Skin cancer     X2 in [place]; now seeing Dr Jarome Matin    Past Surgical History  Procedure Laterality Date  . Lumbar laminectomy  [place]  . Tonsillectomy    . Inguinal hernia repair    . Asd repair  [place]  . Colonoscopy  2008    [facility]  . Cataract extraction  July 2015    bilateral eyes; Dr Thyra Breed, [facility]    Social History   Social History  . Marital Status: Married    Spouse Name: N/A  . Number of Children: N/A  . Years of Education: N/A   Social History Main Topics  .  Smoking status: Never Smoker   . Smokeless tobacco: Never Used  . Alcohol Use: Yes     Comment:  1-2 every 3 weeks  . Drug Use: No  . Sexual Activity: Not on file   Other Topics Concern  . Not on file   Social History Narrative    Family History  Problem Relation Age of Onset  . Hypertension Mother   . Emphysema Father     smoker  . Cancer Neg Hx   . Diabetes Neg Hx   . Heart disease Neg Hx   . Stroke Neg Hx     Review of Systems  Constitutional: Negative for fever.  Respiratory: Negative for shortness of breath.   Cardiovascular: Positive for leg swelling. Negative for chest pain.  Skin: Negative for color change and rash.  Neurological: Negative for dizziness, light-headedness and headaches.       Objective:   Filed Vitals:   07/21/15 1602  BP: 146/100  Pulse: 74  Temp: 98.9 F (37.2 C)  Resp: 16   Filed Weights     07/21/15 1602  Weight: 186 lb (84.369 kg)   Body mass index is 25.95 kg/(m^2).   Physical Exam Constitutional: Appears well-developed and well-nourished. No distress.   Vascular: 2+ DP pulses b/l feet, varicose veins more prominent in RLE compared to LLE Ext: Trace edema right ankle, no edema left ankle Skin: no skin changes in RLE         Assessment & Plan:    See Problem List for Assessment and Plan of chronic medical problems.

## 2015-07-21 NOTE — Patient Instructions (Signed)
A referral was ordered for vascular surgery.  Consider using compression socks for travel.    Your goal BP is < 150/90.

## 2015-07-21 NOTE — Progress Notes (Signed)
Pre visit review using our clinic review tool, if applicable. No additional management support is needed unless otherwise documented below in the visit note. 

## 2015-07-29 ENCOUNTER — Other Ambulatory Visit: Payer: Self-pay | Admitting: Vascular Surgery

## 2015-07-29 DIAGNOSIS — M7989 Other specified soft tissue disorders: Secondary | ICD-10-CM

## 2015-09-07 ENCOUNTER — Encounter: Payer: Self-pay | Admitting: Vascular Surgery

## 2015-09-09 ENCOUNTER — Ambulatory Visit (HOSPITAL_COMMUNITY)
Admission: RE | Admit: 2015-09-09 | Discharge: 2015-09-09 | Disposition: A | Discharge status: Home / Self Care | Payer: Medicare Other | Source: Ambulatory Visit | Attending: Vascular Surgery | Admitting: Vascular Surgery

## 2015-09-09 ENCOUNTER — Encounter: Payer: Self-pay | Admitting: Vascular Surgery

## 2015-09-09 ENCOUNTER — Ambulatory Visit (INDEPENDENT_AMBULATORY_CARE_PROVIDER_SITE_OTHER): Payer: Medicare Other | Admitting: Vascular Surgery

## 2015-09-09 VITALS — BP 156/102 | HR 70 | Ht 71.0 in | Wt 182.7 lb

## 2015-09-09 DIAGNOSIS — I82811 Embolism and thrombosis of superficial veins of right lower extremities: Secondary | ICD-10-CM | POA: Insufficient documentation

## 2015-09-09 DIAGNOSIS — R609 Edema, unspecified: Secondary | ICD-10-CM | POA: Diagnosis present

## 2015-09-09 DIAGNOSIS — I8391 Asymptomatic varicose veins of right lower extremity: Secondary | ICD-10-CM | POA: Insufficient documentation

## 2015-09-09 DIAGNOSIS — M7989 Other specified soft tissue disorders: Secondary | ICD-10-CM | POA: Diagnosis not present

## 2015-09-09 DIAGNOSIS — I872 Venous insufficiency (chronic) (peripheral): Secondary | ICD-10-CM

## 2015-09-09 NOTE — Progress Notes (Signed)
Reason for referral: Swollen right leg  History of Present Illness  Douglas Johnston. is a 70 y.o. male who presents with chief complaint: swollen leg.  Patient notes, onset of swelling 2 months ago, associated with 2 hour walking on the beach barefoot and a long car ride.  The patient has had no history of DVT, positive history of varicose vein, no history of venous stasis ulcers, no history of  Lymphedema and no history of skin changes in lower legs.  There is a family history of venous disorders.  The patient has not used compression stockings in the past. The swelling has since completely resolved. His past medical history includes open heart surgery as a teenager and left inguinal hernia repair.  Past Medical History:  Diagnosis Date  . BPH (benign prostatic hyperplasia)   . Diverticulosis of colon 2008  . External hemorrhoids   . Gilbert's syndrome   . Hyperlipidemia   . Hypertension   . Skin cancer    X2 in [place]; now seeing Dr Jarome Matin    Past Surgical History:  Procedure Laterality Date  . ASD [place] EXTRACTION  July 2015   bilateral eyes; [place] Thyra Breed, [facility]  . COLONOSCOPY  2008   [facility]  . INGUINAL HERNIA REPAIR    . [place]  . open heart     . TONSILLECTOMY      Social History   Social History  . Marital status: Married    Spouse name: N/A  . Number of children: N/A  . Years of education: N/A   Occupational History  . Not on file.   Social History Main Topics  . Smoking status: Never Smoker  . Smokeless tobacco: Never Used  . Alcohol use Yes     Comment:  1-2 every 3 weeks  . Drug use: No  . Sexual activity: Not on file   Other Topics Concern  . Not on file   Social History Narrative  . No narrative on file    Family History  Problem Relation Age of Onset  . Hypertension Mother   . Emphysema Father     smoker  . Cancer Neg Hx   . Diabetes Neg Hx   . Heart  disease Neg Hx   . Stroke Neg Hx     Current Outpatient Prescriptions on File Prior to Visit  Medication Sig Dispense Refill  . alclomethasone (ACLOVATE) 0.05 % cream Apply topically 2 (two) times daily.    . fexofenadine (ALLEGRA) 180 MG tablet Take 180 mg by mouth daily.    . hydrochlorothiazide (HYDRODIURIL) 25 MG tablet TAKE (1/2) TABLET DAILY. 45 tablet 3  . Misc Natural Products (OSTEO BI-FLEX JOINT SHIELD PO) Take by mouth daily.    . Multiple Vitamins-Minerals (CENTRUM SILVER PO) Take by mouth daily.    . Probiotic Product (PROBIOTIC FORMULA PO) Take by mouth daily. Align    . TURMERIC PO Take by mouth daily.    Marland Kitchen VITAMIN E PO Take by mouth daily.    Marland Kitchen desonide (DESOWEN) 0.05 % lotion Apply 1 application topically daily as needed.    . doxycycline (ADOXA) 50 MG tablet Take 50 mg by mouth 2 (two) times daily. For 45 days per Dentist.    . hydrocortisone 2.5 % lotion as needed.     No current facility-administered medications on file prior to visit.  Allergies as of 09/09/2015  . (No Known Allergies)     ROS:   General:  No weight loss, Fever, chills  HEENT: No recent headaches, no nasal bleeding, no visual changes, no sore throat  Neurologic: No dizziness, blackouts, seizures. No recent symptoms of stroke or mini- stroke. No recent episodes of slurred speech, or temporary blindness.  Cardiac: No recent episodes of chest pain/pressure, no shortness of breath at rest.  No shortness of breath with exertion.  Denies history of atrial fibrillation or irregular heartbeat  Vascular: No history of rest pain in feet.  No history of claudication.  No history of non-healing ulcer, No history of DVT   Pulmonary: No home oxygen, no productive cough, no hemoptysis,  No asthma or wheezing  Musculoskeletal:  [ ]  Arthritis, [x ] Low back pain,  [ ]  Joint pain  Hematologic:No history of hypercoagulable state.  No history of easy bleeding.  No history of anemia  Gastrointestinal:  No hematochezia or melena,  No gastroesophageal reflux, no trouble swallowing  Urinary: [ ]  chronic Kidney disease, [ ]  on HD - [ ]  MWF or [ ]  TTHS, [ ]  Burning with urination, [ ]  Frequent urination, [ ]  Difficulty urinating;   Skin: No rashes  Psychological: No history of anxiety,  No history of depression  Physical Examination  Vitals:   09/09/15 1518 09/09/15 1519  BP: (!) 154/101 (!) 156/102  Pulse: 70   SpO2: 99%   Weight: 182 lb 11.2 oz (82.9 kg)   Height: 5\' 11"  (1.803 m)     Body mass index is 25.48 kg/m.  General:  Alert and oriented, no acute distress HEENT: Normal Neck: No bruit or JVD Pulmonary: Clear to auscultation bilaterally Cardiac: Regular Rate and Rhythm without murmur Abdomen: Soft, non-tender, non-distended, no mass, no scars Skin: No rash, multiple scattered varicosities 3-4 mm right leg worse than left Extremity Pulses:  2+ radial, brachial, femoral, dorsalis pedis, posterior tibial pulses bilaterally Musculoskeletal: No deformity or edema  Neurologic: Upper and lower extremity motor 5/5 and symmetric  DATA: Venous duplex 09/09/2015 Right LE deep system reflux femoral-popliteal vein Minimally occlusive chronic thrombus right proximal small saphenous vein  Assessment: 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  Plan: Continue walking and exercise daily When on long car rides or plane trips we recommend moving around at least every hour. 20-30 mmhg compression socks daily Elevation of LE when at rest He will f/u PRN  COLLINS, EMMA MAUREEN PA-C [facility]  The patient was seen today in conjunction with Dr. Oneida Alar   History and exam details as above. Patient has some evidence of deep vein reflux on the right side which is probably a contributor to his right leg swelling. He also developed thrombus in his right lesser  saphenous vein at some point. This may have been the acute episode of swelling. His symptoms have now resolved. He has had no propagation of thrombus into his deep system. I believe at this point primary therapy with be compression therapy alone. He was given details regarding compression stockings today. He has no evidence of arterial occlusive disease. His greater saphenous vein did not have any evidence of reflux so I do not believe he would benefit from a venous intervention at this point. The patient will follow-up on as-needed basis.  Ruta Hinds, MD [facility] Office: [phone] Pager: [phone]

## 2015-10-01 ENCOUNTER — Ambulatory Visit (INDEPENDENT_AMBULATORY_CARE_PROVIDER_SITE_OTHER): Payer: Medicare Other | Admitting: Internal Medicine

## 2015-10-01 ENCOUNTER — Encounter: Payer: Self-pay | Admitting: Internal Medicine

## 2015-10-01 VITALS — BP 160/92 | HR 68 | Temp 97.8°F | Resp 16 | Wt 182.0 lb

## 2015-10-01 DIAGNOSIS — I10 Essential (primary) hypertension: Secondary | ICD-10-CM

## 2015-10-01 DIAGNOSIS — Z23 Encounter for immunization: Secondary | ICD-10-CM

## 2015-10-01 DIAGNOSIS — H9190 Unspecified hearing loss, unspecified ear: Secondary | ICD-10-CM

## 2015-10-01 MED ORDER — HYDROCHLOROTHIAZIDE 25 MG PO TABS: ORAL_TABLET | ORAL | 3 refills | Status: DC

## 2015-10-01 MED ORDER — LOSARTAN POTASSIUM 25 MG PO TABS: 25.0000 mg | ORAL_TABLET | Freq: Every day | ORAL | 5 refills | Status: DC

## 2015-10-01 NOTE — Patient Instructions (Addendum)
  Test(s) ordered today. Your results will be released to [facility] (or called to you) after review, usually within 72hours after test completion. If any changes need to be made, you will be notified at that same time.   Flu vaccine administered today.   Medications reviewed and updated.  Changes include taking the hctz 12.5 mg as needed and start the losartan 25 mg daily. Monitor your blood pressure at home  Your prescription(s) have been submitted to your pharmacy. Please take as directed and contact our office if you believe you are having problem(s) with the medication(s).  A referral for audiology was ordered.

## 2015-10-01 NOTE — Progress Notes (Signed)
Pre visit review using our clinic review tool, if applicable. No additional management support is needed unless otherwise documented below in the visit note. 

## 2015-10-01 NOTE — Assessment & Plan Note (Signed)
Variable at home and overall not ideally controlled Will hold hctz for now - he will take 1/2 tab of hctz as needed for his leg edema Start losartan 25 mg daily - advised we may need to adjust this He will monitor BP at home He will update me with his numbers so we can adjust his medicatioin

## 2015-10-01 NOTE — Progress Notes (Signed)
Subjective:    Patient ID: Douglas Hammond., male    DOB: 1945/05/04, 70 y.o.   MRN: [id number]  HPI The patient is here for follow up.  Hypertension: He is taking his medication daily. He is compliant with a low sodium diet.  He denies chest pain, palpitations, edema, shortness of breath and regular headaches. He is exercising regularly.  He does monitor his blood pressure at home- some numbers: 170/99, 167/96, 170/92, 150/91 after exercise 116/72, 108/61.  When his blood pressure is high he feels "off" and when it is low he feels lightheaded.    Three to four times a week he takes an extra 1/2 of a hctz pill when his BP is high.    His hearing is decreased and he feels it is time to have it evaluated.   Medications and allergies reviewed with patient and updated if appropriate.  Patient Active Problem List   Diagnosis Date Noted  . 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 Right ankle swelling 07/21/2015  . Maxillary sinusitis 05/18/2015  . Right carotid bruit 12/28/2012  . Hyperglycemia 10/16/2012  . 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 Essential hypertension 01/26/2009  . ELECTROCARDIOGRAM, ABNORMAL 01/26/2009  . SKIN CANCER, HX OF 01/26/2009  . ATRIAL SEPTAL DEFECT, HX OF 01/26/2009  . Hyperlipidemia 08/13/2006  . 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 Diverticulosis of large intestine 07/27/2006    Current Outpatient Prescriptions on File Prior to Visit  Medication Sig Dispense Refill  . alclomethasone (ACLOVATE) 0.05 % cream Apply topically 2 (two) times daily.    . fexofenadine (ALLEGRA) 180 MG tablet Take 180 mg by mouth daily.    . hydrochlorothiazide (HYDRODIURIL) 25 MG tablet TAKE (1/2) TABLET DAILY. 45 tablet 3  . Misc Natural Products (OSTEO BI-FLEX JOINT SHIELD PO) Take by mouth daily.    . Multiple Vitamins-Minerals (CENTRUM SILVER PO) Take by mouth daily.    . Probiotic Product (PROBIOTIC FORMULA PO) Take by mouth daily. Align    .  TURMERIC PO Take by mouth daily.    Marland Kitchen VITAMIN E PO Take by mouth daily.     No current facility-administered medications on file prior to visit.     Past Medical History:  Diagnosis Date  . BPH (benign prostatic hyperplasia)   . Diverticulosis of colon 2008  . External hemorrhoids   . Gilbert's syndrome   . Hyperlipidemia   . Hypertension   . Skin cancer    X2 in [place]; now seeing Dr Jarome Matin    Past Surgical History:  Procedure Laterality Date  . ASD [place] EXTRACTION  July 2015   bilateral eyes; [place] Thyra Breed, [facility]  . COLONOSCOPY  2008   [facility]  . INGUINAL HERNIA REPAIR    . [place]  . open heart     . TONSILLECTOMY      Social History   Social History  . Marital status: Married    Spouse name: N/A  . Number of children: N/A  . Years of education: N/A   Social History Main Topics  . Smoking status: Never Smoker  . Smokeless tobacco: Never Used  . Alcohol use Yes     Comment:  1-2 every 3 weeks  . Drug use: No  . Sexual activity: Not on file   Other Topics Concern  . Not on file   Social History  Narrative  . No narrative on file    Family History  Problem Relation Age of Onset  . Hypertension Mother   . Emphysema Father     smoker  . Cancer Neg Hx   . Diabetes Neg Hx   . Heart disease Neg Hx   . Stroke Neg Hx     Review of Systems  Respiratory: Negative for shortness of breath.   Cardiovascular: Positive for leg swelling (controlled). Negative for chest pain and palpitations.  Gastrointestinal:       GERD occ  Neurological: Positive for light-headedness (with low BP). Negative for headaches.       Objective:   Vitals:   10/01/15 1614  BP: (!) 160/92  Pulse: 68  Resp: 16  Temp: 97.8 F (36.6 C)   Filed Weights   10/01/15 1614  Weight: 182 lb (82.6 kg)   Body mass index is 25.38 kg/m.   Physical Exam    Constitutional: Appears well-developed and  well-nourished. No distress.  HENT:  Head: Normocephalic and atraumatic.  Neck: Neck supple. No tracheal deviation present. No thyromegaly present.  Cardiovascular: Normal rate, regular rhythm and normal heart sounds.   No murmur heard. No carotid bruit  Pulmonary/Chest: Effort normal and breath sounds normal. No respiratory distress. No has no wheezes. No rales.  Musculoskeletal: No edema.  Lymphadenopathy: No cervical adenopathy.  Skin: Skin is warm and dry. Not diaphoretic.  Psychiatric: Normal mood and affect. Behavior is normal.     Assessment & Plan:    See Problem List for Assessment and Plan of chronic medical problems.

## 2015-10-06 ENCOUNTER — Encounter: Payer: Self-pay | Admitting: Internal Medicine

## 2015-12-10 ENCOUNTER — Telehealth: Payer: Self-pay | Admitting: Internal Medicine

## 2015-12-10 DIAGNOSIS — R009 Unspecified abnormalities of heart beat: Secondary | ICD-10-CM

## 2015-12-10 NOTE — Telephone Encounter (Signed)
Pt wife called in and said that he would like a referral to [facility].  She said that his bp is going way up when he is resting and down when he works out.     Best number [phone]

## 2015-12-10 NOTE — Telephone Encounter (Signed)
Please advise 

## 2015-12-10 NOTE — Telephone Encounter (Signed)
Referral ordered

## 2015-12-13 NOTE — Telephone Encounter (Signed)
Wife has been notified...Douglas Johnston

## 2015-12-16 ENCOUNTER — Encounter: Payer: Self-pay | Admitting: Internal Medicine

## 2015-12-16 ENCOUNTER — Ambulatory Visit (INDEPENDENT_AMBULATORY_CARE_PROVIDER_SITE_OTHER): Payer: Medicare Other | Admitting: Internal Medicine

## 2015-12-16 ENCOUNTER — Other Ambulatory Visit (INDEPENDENT_AMBULATORY_CARE_PROVIDER_SITE_OTHER): Payer: Medicare Other

## 2015-12-16 VITALS — BP 126/88 | HR 74 | Temp 98.0°F | Resp 16 | Wt 184.0 lb

## 2015-12-16 DIAGNOSIS — I10 Essential (primary) hypertension: Secondary | ICD-10-CM

## 2015-12-16 LAB — COMPREHENSIVE METABOLIC PANEL
ALT: 15 U/L (ref 0–53)
AST: 19 U/L (ref 0–37)
Albumin: 4.4 g/dL (ref 3.5–5.2)
Alkaline Phosphatase: 67 U/L (ref 39–117)
BUN: 22 mg/dL (ref 6–23)
CO2: 31 mEq/L (ref 19–32)
Calcium: 9.5 mg/dL (ref 8.4–10.5)
Chloride: 102 mEq/L (ref 96–112)
Creatinine, Ser: 0.99 mg/dL (ref 0.40–1.50)
GFR: 78.99 mL/min (ref >60.00)
Glucose, Bld: 96 mg/dL (ref 70–99)
Potassium: 4.4 mEq/L (ref 3.5–5.1)
Sodium: 139 mEq/L (ref 135–145)
Total Bilirubin: 1 mg/dL (ref 0.2–1.2)
Total Protein: 7.7 g/dL (ref 6.0–8.3)

## 2015-12-16 MED ORDER — AMLODIPINE BESYLATE 5 MG PO TABS: 5.0000 mg | ORAL_TABLET | Freq: Every day | ORAL | 3 refills | Status: DC

## 2015-12-16 NOTE — Assessment & Plan Note (Addendum)
BP not controlled - variable Continue hctz 12.5 mg daily Will try adding amlodipine 5 mg daily - discussed side effects D/c losartan since it did not seem to do much cmp today Discussed we will need to adjust medications as needed to get BP well controlled He will update via mychart with his BP next week

## 2015-12-16 NOTE — Progress Notes (Signed)
Pre visit review using our clinic review tool, if applicable. No additional management support is needed unless otherwise documented below in the visit note. 

## 2015-12-16 NOTE — Progress Notes (Signed)
Subjective:    Patient ID: Douglas Hammond., male    DOB: 06-Sep-1945, 70 y.o.   MRN: [id number]  HPI The patient is here for follow up for elevated BP.  Hypertension: He is taking his medication daily. He is compliant with a low sodium diet.  He denies chest pain, palpitations, edema, shortness of breath and regular headaches. He is exercising regularly.  He does monitor his blood pressure at home:   170/100, 160/90's , after exercise 115/62, 101/67, 165/94, 115/62 (after wine), 154/94.  Red wine seems to lower his BP.    One day he took a full hctz 154/95 in AM - 130/78 at 10 am, 134/72, 154/94 at night.     Medications and allergies reviewed with patient and updated if appropriate.  Patient Active Problem List   Diagnosis Date Noted  . 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 Right ankle swelling 07/21/2015  . Maxillary sinusitis 05/18/2015  . Right carotid bruit 12/28/2012  . Hyperglycemia 10/16/2012  . 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 Essential hypertension 01/26/2009  . ELECTROCARDIOGRAM, ABNORMAL 01/26/2009  . SKIN CANCER, HX OF 01/26/2009  . ATRIAL SEPTAL DEFECT, HX OF 01/26/2009  . Hyperlipidemia 08/13/2006  . 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 Diverticulosis of large intestine 07/27/2006    Current Outpatient Prescriptions on File Prior to Visit  Medication Sig Dispense Refill  . alclomethasone (ACLOVATE) 0.05 % cream Apply topically 2 (two) times daily.    . fexofenadine (ALLEGRA) 180 MG tablet Take 180 mg by mouth daily.    . hydrochlorothiazide (HYDRODIURIL) 25 MG tablet TAKE (1/2) TABLET DAILY PRN. 45 tablet 3  . losartan (COZAAR) 25 MG tablet Take 1 tablet (25 mg total) by mouth daily. 30 tablet 5  . Misc Natural Products (OSTEO BI-FLEX JOINT SHIELD PO) Take by mouth daily.    . Multiple Vitamins-Minerals (CENTRUM SILVER PO) Take by mouth daily.    . Probiotic Product (PROBIOTIC FORMULA PO) Take by mouth daily. Align    .  TURMERIC PO Take by mouth daily.    Marland Kitchen VITAMIN E PO Take by mouth daily.     No current facility-administered medications on file prior to visit.     Past Medical History:  Diagnosis Date  . BPH (benign prostatic hyperplasia)   . Diverticulosis of colon 2008  . External hemorrhoids   . Gilbert's syndrome   . Hyperlipidemia   . Hypertension   . Skin cancer    X2 in [place]; now seeing Dr Jarome Matin    Past Surgical History:  Procedure Laterality Date  . ASD [place] EXTRACTION  July 2015   bilateral eyes; [place] Thyra Breed, [facility]  . COLONOSCOPY  2008   [facility]  . INGUINAL HERNIA REPAIR    . [place]  . open heart     . TONSILLECTOMY      Social History   Social History  . Marital status: Married    Spouse name: N/A  . Number of children: N/A  . Years of education: N/A   Social History Main Topics  . Smoking status: Never Smoker  . Smokeless tobacco: Never Used  . Alcohol use Yes     Comment:  1-2 every 3 weeks  . Drug use: No  . Sexual activity: Not on file   Other Topics Concern  . Not on file   Social  History Narrative  . No narrative on file    Family History  Problem Relation Age of Onset  . Hypertension Mother   . Emphysema Father     smoker  . Cancer Neg Hx   . Diabetes Neg Hx   . Heart disease Neg Hx   . Stroke Neg Hx     Review of Systems  Respiratory: Negative for shortness of breath.   Cardiovascular: Negative for chest pain, palpitations and leg swelling.  Neurological: Positive for light-headedness (occ with working out). Negative for dizziness and headaches.       Objective:   Vitals:   12/16/15 1125 12/16/15 1138  BP: (!) 156/94 126/88  Pulse: 74   Resp: 16   Temp: 98 F (36.7 C)    Filed Weights   12/16/15 1125  Weight: 184 lb (83.5 kg)   Body mass index is 25.66 kg/m.   Physical Exam    Constitutional: Appears well-developed and well-nourished. No  distress.  HENT:  Head: Normocephalic and atraumatic.  Neck: Neck supple. No tracheal deviation present. No thyromegaly present.  No cervical lymphadenopathy Cardiovascular: Normal rate, regular rhythm and normal heart sounds.   No murmur heard. No carotid bruit .  No edema Pulmonary/Chest: Effort normal and breath sounds normal. No respiratory distress. No has no wheezes. No rales.  Skin: Skin is warm and dry. Not diaphoretic.  Psychiatric: Normal mood and affect. Behavior is normal.      Assessment & Plan:    See Problem List for Assessment and Plan of chronic medical problems.

## 2015-12-16 NOTE — Patient Instructions (Addendum)
  Test(s) ordered today. Your results will be released to [facility] (or called to you) after review, usually within 72hours after test completion. If any changes need to be made, you will be notified at that same time.   Medications reviewed and updated.  Changes include stopping the losartan and starting amlodipine 5 mg at night.  Continue the hctz.   Your prescription(s) have been submitted to your pharmacy. Please take as directed and contact our office if you believe you are having problem(s) with the medication(s).

## 2015-12-31 ENCOUNTER — Other Ambulatory Visit: Payer: Self-pay | Admitting: Internal Medicine

## 2016-02-01 ENCOUNTER — Telehealth: Payer: Self-pay | Admitting: Internal Medicine

## 2016-02-01 MED ORDER — CEFDINIR 300 MG PO CAPS: 300.0000 mg | ORAL_CAPSULE | Freq: Two times a day (BID) | ORAL | 0 refills | Status: DC

## 2016-02-01 NOTE — Telephone Encounter (Signed)
Please advise 

## 2016-02-01 NOTE — Telephone Encounter (Signed)
Wife called in stating that husband has exact same thing that Dr. Quay Burow seen her for.  Wanted to know if Dr. Quay Burow could send patient something in to [facility].

## 2016-02-01 NOTE — Telephone Encounter (Signed)
I usually like to see someone before sending in an antibiotic but I will do it this time - sent to pharmacy.

## 2016-02-02 NOTE — Telephone Encounter (Signed)
LVM informing pts wife.  

## 2016-02-04 ENCOUNTER — Telehealth: Payer: Self-pay | Admitting: Internal Medicine

## 2016-02-04 ENCOUNTER — Encounter: Payer: Self-pay | Admitting: Adult Health

## 2016-02-04 ENCOUNTER — Ambulatory Visit (INDEPENDENT_AMBULATORY_CARE_PROVIDER_SITE_OTHER): Payer: Medicare Other | Admitting: Adult Health

## 2016-02-04 VITALS — BP 118/76 | Temp 98.6°F | Ht 71.0 in | Wt 186.6 lb

## 2016-02-04 DIAGNOSIS — R6889 Other general symptoms and signs: Secondary | ICD-10-CM

## 2016-02-04 DIAGNOSIS — J0100 Acute maxillary sinusitis, unspecified: Secondary | ICD-10-CM | POA: Diagnosis not present

## 2016-02-04 LAB — POCT INFLUENZA A/B
Influenza A, POC: NEGATIVE
Influenza B, POC: NEGATIVE

## 2016-02-04 NOTE — Telephone Encounter (Signed)
Agree with appointment if available or urgent care.

## 2016-02-04 NOTE — Progress Notes (Signed)
Subjective:    Patient ID: Douglas Johnston., male    DOB: [birth year], 71 y.o.   MRN: [id number]  Sinusitis  This is a new problem. The current episode started in the past 7 days (5 days ). The maximum temperature recorded prior to his arrival was 102 - 102.9 F. Associated symptoms include chills, congestion, coughing, sinus pressure and a sore throat. Past treatments include antibiotics.   Was prescribed Cefdinir for cough on Tuesday by Dr. Quay Burow. He reports since prescribing this medication he has developed a fever and sinus pain and pressure. His fever has been up to 102. He has been using tylenol to help control hsi symptoms.    Review of Systems  Constitutional: Positive for chills, fatigue and fever.  HENT: Positive for congestion, sinus pain, sinus pressure and sore throat. Negative for trouble swallowing and voice change.   Respiratory: Positive for cough.    Past Medical History:  Diagnosis Date  . BPH (benign prostatic hyperplasia)   . Diverticulosis of colon 2008  . External hemorrhoids   . Gilbert's syndrome   . Hyperlipidemia   . Hypertension   . Skin cancer    X2 in [place]; now seeing Dr Jarome Matin    Social History   Social History  . Marital status: Married    Spouse name: N/A  . Number of children: N/A  . Years of education: N/A   Occupational History  . Not on file.   Social History Main Topics  . Smoking status: Never Smoker  . Smokeless tobacco: Never Used  . Alcohol use Yes     Comment:  1-2 every 3 weeks  . Drug use: No  . Sexual activity: Not on file   Other Topics Concern  . Not on file   Social History Narrative  . No narrative on file    Past Surgical History:  Procedure Laterality Date  . ASD [place] EXTRACTION  July 2015   bilateral eyes; Dr Thyra Breed, [facility]  . COLONOSCOPY  2008   [facility]  . INGUINAL HERNIA REPAIR    . [place]  . open heart     .  TONSILLECTOMY      Family History  Problem Relation Age of Onset  . Hypertension Mother   . Emphysema Father     smoker  . Cancer Neg Hx   . Diabetes Neg Hx   . Heart disease Neg Hx   . Stroke Neg Hx     No Known Allergies  Current Outpatient Prescriptions on File Prior to Visit  Medication Sig Dispense Refill  . alclomethasone (ACLOVATE) 0.05 % cream Apply topically 2 (two) times daily.    Marland Kitchen amLODipine (NORVASC) 5 MG tablet Take 1 tablet (5 mg total) by mouth daily. 90 tablet 3  . cefdinir (OMNICEF) 300 MG capsule Take 1 capsule (300 mg total) by mouth 2 (two) times daily. 20 capsule 0  . fexofenadine (ALLEGRA) 180 MG tablet Take 180 mg by mouth daily.    . hydrochlorothiazide (HYDRODIURIL) 25 MG tablet TAKE (1/2) TABLET DAILY. 45 tablet 3  . Misc Natural Products (OSTEO BI-FLEX JOINT SHIELD PO) Take by mouth daily.    . Multiple Vitamins-Minerals (CENTRUM SILVER PO) Take by mouth daily.    . Probiotic Product (PROBIOTIC FORMULA PO) Take by mouth daily. Align    . TURMERIC PO Take by mouth daily.    Marland Kitchen  VITAMIN E PO Take by mouth daily.     No current facility-administered medications on file prior to visit.     BP 118/76   Temp 98.6 F (37 C) (Oral)   Ht 5\' 11"  (1.803 m)   Wt 186 lb 9.6 oz (84.6 kg)   BMI 26.03 kg/m       Objective:   Physical Exam  Constitutional: He is oriented to person, place, and time. He appears well-developed and well-nourished. No distress.  HENT:  Head: Normocephalic and atraumatic.  Right Ear: Hearing, tympanic membrane, external ear and ear canal normal.  Left Ear: Hearing, tympanic membrane, external ear and ear canal normal.  Nose: Mucosal edema and rhinorrhea present. Right sinus exhibits maxillary sinus tenderness. Right sinus exhibits no frontal sinus tenderness. Left sinus exhibits maxillary sinus tenderness. Left sinus exhibits no frontal sinus tenderness.  Mouth/Throat: Uvula is midline, oropharynx is clear and moist and mucous  membranes are normal. No oropharyngeal exudate.  Neck: Normal range of motion. Neck supple.  Cardiovascular: Normal rate, regular rhythm, normal heart sounds and intact distal pulses.  Exam reveals no gallop and no friction rub.   No murmur heard. Pulmonary/Chest: Effort normal and breath sounds normal. No respiratory distress. He has no wheezes. He has no rales. He exhibits no tenderness.  Lymphadenopathy:    He has no cervical adenopathy.  Neurological: He is alert and oriented to person, place, and time.  Skin: Skin is warm and dry. No rash noted. He is not diaphoretic. No erythema. No pallor.  Psychiatric: He has a normal mood and affect. His behavior is normal. Judgment and thought content normal.  Nursing note and vitals reviewed.     Assessment & Plan:  1. Flu-like symptoms - POC Influenza A/B- negative   2. Acute maxillary sinusitis, recurrence not specified - Continue with current abx treatment, this will be his third full day - Continue with tylenol/motrin - stay hydrated and rest - Follow up if no improvement  Dorothyann Peng, NP

## 2016-02-04 NOTE — Telephone Encounter (Signed)
Patient Name: Douglas Johnston  DOB: Dec 18, 1945    Initial Comment Caller states, her husband has a 70. fever- does not want to get out of bed. Using antibiotics and not getting better. Verified - she is needing an appointment.    Nurse Assessment  Nurse: Raphael Gibney, RN, Vanita Ingles Date/Time (Eastern Time): 02/04/2016 8:59:09 AM  Confirm and document reason for call. If symptomatic, describe symptoms. ---Caller states spouse already has appt at 10:30 am at [facility].  Does the patient have any new or worsening symptoms? ---Yes  Will a triage be completed? ---No  Select reason for no triage. ---Other  Please document clinical information provided and list any resource used. ---Caller states spouse already has appt at 10:30 am.     Guidelines    Guideline Title Affirmed Question Affirmed Notes       Final Disposition User   Clinical Call [place], RN, Vanita Ingles

## 2016-04-28 ENCOUNTER — Telehealth: Payer: Self-pay | Admitting: Internal Medicine

## 2016-04-28 DIAGNOSIS — I10 Essential (primary) hypertension: Secondary | ICD-10-CM

## 2016-04-28 NOTE — Telephone Encounter (Signed)
Pts wife called stating that the pts BP has been very high lately. She is concerned with how high it has been. She said that the pt does not want to come in to see Dr Quay Burow so his wife would like to see if a referral can be put in for him to see Dr Jenkins Rouge (cardiologist). Please advise.

## 2016-04-29 NOTE — Telephone Encounter (Signed)
Referral ordered

## 2016-05-01 NOTE — Telephone Encounter (Signed)
Called pt/wife no amswer [facility] MD has placed referral will be contacted by [facility] w/appt date, and time once referral has been set-up...Johny Chess

## 2016-05-30 ENCOUNTER — Encounter: Payer: Self-pay | Admitting: Internal Medicine

## 2016-06-09 DIAGNOSIS — R194 Change in bowel habit: Secondary | ICD-10-CM

## 2016-06-09 HISTORY — DX: Change in bowel habit: R19.4

## 2016-07-06 NOTE — Progress Notes (Signed)
Cardiology Office Note   Date:  07/07/2016   ID:  Douglas Johnston., DOB Jul 01, 1945, MRN [id number]  PCP:  Binnie Rail, MD  Cardiologist:   Jenkins Rouge, MD   No chief complaint on file.     History of Present Illness: Douglas Johnston. is a 71 y.o. male who presents for consultation regarding poorly controlled HTN.  Referred by Dr Quay Burow. He has a history of ASD repair as a child in 1961. Has been on BP meds for years. Tries to follow low sodium diet. Red wine seems to bring BP down. 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 Ineffective and started on norvasc.  He is active playing racquet ball 6 hours / week Some exertional dyspnea. Retired from [employer] from [place] Married no children  Currently on [employer] as that is what his wife prefers      Past Medical History:  Diagnosis Date  . BPH (benign prostatic hyperplasia)   . Diverticulosis of colon 2008  . External hemorrhoids   . Gilbert's syndrome   . Hyperlipidemia   . Hypertension   . Skin cancer    X2 in [place]; now seeing Dr Jarome Matin    Past Surgical History:  Procedure Laterality Date  . ASD [place] EXTRACTION  July 2015   bilateral eyes; [facility] Thyra Breed, [facility]  . COLONOSCOPY  2008   [facility]  . INGUINAL HERNIA REPAIR    . [place]  . open heart     . TONSILLECTOMY       Current Outpatient Prescriptions  Medication Sig Dispense Refill  . alclomethasone (ACLOVATE) 0.05 % cream Apply 1 application topically 2 (two) times daily.     Marland Kitchen amLODipine (NORVASC) 5 MG tablet Take 1 tablet (5 mg total) by mouth daily. 90 tablet 3  . fexofenadine (ALLEGRA) 180 MG tablet Take 180 mg by mouth daily.    . hydrochlorothiazide (HYDRODIURIL) 25 MG tablet Take 12.5 mg by mouth daily.    . Multiple Vitamins-Minerals (CENTRUM SILVER PO) Take 1 tablet by mouth daily.     .  Probiotic Product (PROBIOTIC FORMULA PO) Take 1 capsule by mouth daily. Align     . VITAMIN E PO Take 1 capsule by mouth daily.      No current facility-administered medications for this visit.     Allergies:   Patient has no known allergies.    Social History:  The patient  reports that he has never smoked. He has never used smokeless tobacco. He reports that he drinks alcohol. He reports that he does not use drugs.   Family History:  The patient's family history includes Emphysema in his father; Hypertension in his mother.    ROS:  Please see the history of present illness.   Otherwise, review of systems are positive for none.   All other systems are reviewed and negative.    PHYSICAL EXAM: VS:  BP 136/90   Pulse 61   Ht 6' (1.829 m)   Wt 80.7 kg (178 lb)   SpO2 98%   BMI 24.14 kg/m  , BMI Body mass index is 24.14 kg/m. Affect appropriate Healthy:  appears stated age 80: normal Neck supple with no adenopathy JVP normal no bruits no thyromegaly Lungs clear with no wheezing and good diaphragmatic motion Heart:  S1/S2 no murmur, no rub, gallop or click large horizontal scar under both Pectoral areas form open heart surgery  PMI normal Abdomen: benighn, BS positve, no tenderness, no AAA no bruit.  No HSM or HJR Distal pulses intact with no bruits No edema Neuro non-focal Skin warm and dry No muscular weakness    EKG:  01/07/14  SR rate 56 PR 260 RBBB / LAFB  07/07/16  SR rate 61 LAE PR 240 msec ICRBBB lateral T wave inversions    Recent Labs: 12/16/2015: ALT 15; BUN 22; Creatinine, Ser 0.99; Potassium 4.4; Sodium 139    Lipid Panel    Component Value Date/Time   CHOL 164 12/25/2014 1023   CHOL 186 01/07/2014 1137   TRIG 67.0 12/25/2014 1023   TRIG 64 01/07/2014 1137   HDL 46.80 12/25/2014 1023   HDL 56 01/07/2014 1137   CHOLHDL 4 12/25/2014 1023   VLDL 13.4 12/25/2014 1023   LDLCALC 104 (H) 12/25/2014 1023   LDLCALC 117 (H) 01/07/2014 1137      Wt  Readings from Last 3 Encounters:  07/07/16 80.7 kg (178 lb)  02/04/16 84.6 kg (186 lb 9.6 oz)  12/16/15 83.5 kg (184 lb)      Other studies Reviewed: Additional studies/ records that were reviewed today include: Notes Dr Quay Burow labs and ECG .    ASSESSMENT AND PLAN:  1. HTN per primary did not take norvasc this am 2. 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 Edema low sodium diet no evidence of severe venous reflux 5. Dyspnea see echo above given abnormal echo and latera T wave changes f/u ETT Myovue Has not had stress test since 2006    Current medicines are reviewed at length with the patient today.  The patient does not have concerns regarding medicines.  The following changes have been made:  no change  Labs/ tests ordered today include: Ex Myovue , Echo   Orders Placed This Encounter  Procedures  . Myocardial Perfusion Imaging  . EKG 12-Lead  . ECHOCARDIOGRAM COMPLETE     Disposition:   FU with me in a year      Signed, Jenkins Rouge, MD  07/07/2016 10:27 AM    [facility] [address], [place], [place]  [address] Phone: [phone]; Fax: [phone]

## 2016-07-07 ENCOUNTER — Ambulatory Visit (INDEPENDENT_AMBULATORY_CARE_PROVIDER_SITE_OTHER): Payer: Medicare Other | Admitting: Cardiovascular Disease

## 2016-07-07 ENCOUNTER — Encounter: Payer: Self-pay | Admitting: Cardiovascular Disease

## 2016-07-07 VITALS — BP 136/90 | HR 61 | Ht 72.0 in | Wt 178.0 lb

## 2016-07-07 DIAGNOSIS — Z8774 Personal history of (corrected) congenital malformations of heart and circulatory system: Secondary | ICD-10-CM | POA: Diagnosis not present

## 2016-07-07 DIAGNOSIS — R0602 Shortness of breath: Secondary | ICD-10-CM | POA: Diagnosis not present

## 2016-07-07 DIAGNOSIS — I10 Essential (primary) hypertension: Secondary | ICD-10-CM | POA: Diagnosis not present

## 2016-07-07 NOTE — Patient Instructions (Addendum)
Medication Instructions:  Your physician recommends that you continue on your current medications as directed. Please refer to the Current Medication list given to you today.  Labwork: NONE  Testing/Procedures: Your physician has requested that you have en exercise stress myoview. For further information please visit [email or web address]. Please follow instruction sheet, as given.  Your physician has requested that you have an echocardiogram. Echocardiography is a painless test that uses sound waves to create images of your heart. It provides your doctor with information about the size and shape of your heart and how well your heart's chambers and valves are working. This procedure takes approximately one hour. There are no restrictions for this procedure.   Follow-Up: Your physician wants you to follow-up in: 12 months with Dr. Nishan. You will receive a reminder letter in the mail two months in advance. If you don't receive a letter, please call our office to schedule the follow-up appointment.   If you need a refill on your cardiac medications before your next appointment, please call your pharmacy.    

## 2016-07-10 ENCOUNTER — Telehealth (HOSPITAL_COMMUNITY): Payer: Self-pay | Admitting: *Deleted

## 2016-07-10 ENCOUNTER — Encounter: Payer: Self-pay | Admitting: Internal Medicine

## 2016-07-10 NOTE — Telephone Encounter (Signed)
Left message on voicemail per DPR in reference to upcoming appointment scheduled on 07/13/16 with detailed instructions given per Myocardial Perfusion Study Information Sheet for the test. LM to arrive 15 minutes early, and that it is imperative to arrive on time for appointment to keep from having the test rescheduled. If you need to cancel or reschedule your appointment, please call the office within 24 hours of your appointment. Failure to do so may result in a cancellation of your appointment, and a $50 no show fee. Phone number given for call back for any questions. Kirstie Peri

## 2016-07-13 ENCOUNTER — Ambulatory Visit (HOSPITAL_COMMUNITY): Payer: Medicare Other | Attending: Cardiology

## 2016-07-13 DIAGNOSIS — I4510 Unspecified right bundle-branch block: Secondary | ICD-10-CM | POA: Insufficient documentation

## 2016-07-13 DIAGNOSIS — R0600 Dyspnea, unspecified: Secondary | ICD-10-CM | POA: Insufficient documentation

## 2016-07-13 DIAGNOSIS — R0602 Shortness of breath: Secondary | ICD-10-CM

## 2016-07-13 DIAGNOSIS — Z8774 Personal history of (corrected) congenital malformations of heart and circulatory system: Secondary | ICD-10-CM | POA: Diagnosis not present

## 2016-07-13 DIAGNOSIS — I10 Essential (primary) hypertension: Secondary | ICD-10-CM

## 2016-07-13 LAB — MYOCARDIAL PERFUSION IMAGING
Estimated workload: 10.1 METS
Exercise duration (min): 9 min
Exercise duration (sec): 0 s
LV dias vol: 129 mL (ref 62–150)
LV sys vol: 64 mL
MPHR: 148 {beats}/min
Peak HR: 130 {beats}/min
Percent HR: 87 %
RATE: 0.23
Rest HR: 57 {beats}/min
SDS: 1
SRS: 1
SSS: 2
TID: 1.01

## 2016-07-13 MED ORDER — TECHNETIUM TC 99M TETROFOSMIN IV KIT
30.7000 | PACK | Freq: Once | INTRAVENOUS | Status: AC | PRN
  Administered 2016-07-13: 30.7 via INTRAVENOUS
  Filled 2016-07-13: qty 31

## 2016-07-13 MED ORDER — TECHNETIUM TC 99M TETROFOSMIN IV KIT
10.5000 | PACK | Freq: Once | INTRAVENOUS | Status: AC | PRN
  Administered 2016-07-13: 10.5 via INTRAVENOUS
  Filled 2016-07-13: qty 11

## 2016-07-17 ENCOUNTER — Telehealth: Payer: Self-pay

## 2016-07-17 DIAGNOSIS — Z8774 Personal history of (corrected) congenital malformations of heart and circulatory system: Secondary | ICD-10-CM

## 2016-07-17 DIAGNOSIS — R0602 Shortness of breath: Secondary | ICD-10-CM

## 2016-07-17 NOTE — Telephone Encounter (Signed)
-----   Message from Josue Hector, MD sent at 07/13/2016  5:22 PM EDT ----- No evidence of CAD but EF reported as mildly reduced Supposed to have echo to f/u ASD repair make sure it gets done with bubble study and to reassess EF

## 2016-07-17 NOTE — Telephone Encounter (Signed)
Changed order for echo with bubble study- patient is already schedule for echo on 07/19/16.

## 2016-07-19 ENCOUNTER — Other Ambulatory Visit: Payer: Self-pay

## 2016-07-19 ENCOUNTER — Ambulatory Visit (HOSPITAL_COMMUNITY): Payer: Medicare Other | Attending: Cardiovascular Disease

## 2016-07-19 DIAGNOSIS — I517 Cardiomegaly: Secondary | ICD-10-CM | POA: Diagnosis not present

## 2016-07-19 DIAGNOSIS — R0602 Shortness of breath: Secondary | ICD-10-CM | POA: Diagnosis not present

## 2016-07-19 DIAGNOSIS — I361 Nonrheumatic tricuspid (valve) insufficiency: Secondary | ICD-10-CM | POA: Diagnosis not present

## 2016-07-19 DIAGNOSIS — Z8774 Personal history of (corrected) congenital malformations of heart and circulatory system: Secondary | ICD-10-CM | POA: Diagnosis not present

## 2016-09-12 ENCOUNTER — Ambulatory Visit (AMBULATORY_SURGERY_CENTER): Payer: Self-pay

## 2016-09-12 ENCOUNTER — Encounter: Payer: Self-pay | Admitting: Internal Medicine

## 2016-09-12 VITALS — Ht 72.0 in | Wt 171.8 lb

## 2016-09-12 DIAGNOSIS — Z1211 Encounter for screening for malignant neoplasm of colon: Secondary | ICD-10-CM

## 2016-09-12 MED ORDER — NA SULFATE-K SULFATE-MG SULF 17.5-3.13-1.6 GM/177ML PO SOLN: ORAL | 0 refills | Status: DC

## 2016-09-12 NOTE — Progress Notes (Signed)
Per pt, no allergies to soy or egg products.Pt not taking any weight loss meds or using  O2 at home.   Pt refused Emmi video. 

## 2016-09-14 DIAGNOSIS — L812 Freckles: Secondary | ICD-10-CM | POA: Diagnosis not present

## 2016-09-14 DIAGNOSIS — L821 Other seborrheic keratosis: Secondary | ICD-10-CM | POA: Diagnosis not present

## 2016-09-14 DIAGNOSIS — Z85828 Personal history of other malignant neoplasm of skin: Secondary | ICD-10-CM | POA: Diagnosis not present

## 2016-09-14 DIAGNOSIS — Z85820 Personal history of malignant melanoma of skin: Secondary | ICD-10-CM | POA: Diagnosis not present

## 2016-09-14 DIAGNOSIS — L570 Actinic keratosis: Secondary | ICD-10-CM | POA: Diagnosis not present

## 2016-09-14 DIAGNOSIS — D1801 Hemangioma of skin and subcutaneous tissue: Secondary | ICD-10-CM | POA: Diagnosis not present

## 2016-09-15 ENCOUNTER — Encounter: Payer: Self-pay | Admitting: Internal Medicine

## 2016-09-19 ENCOUNTER — Encounter: Payer: Self-pay | Admitting: Internal Medicine

## 2016-09-19 ENCOUNTER — Ambulatory Visit (INDEPENDENT_AMBULATORY_CARE_PROVIDER_SITE_OTHER): Payer: Medicare Other | Admitting: Internal Medicine

## 2016-09-19 VITALS — BP 128/72 | HR 60 | Temp 97.9°F | Resp 16 | Wt 170.0 lb

## 2016-09-19 DIAGNOSIS — R42 Dizziness and giddiness: Secondary | ICD-10-CM | POA: Diagnosis not present

## 2016-09-19 DIAGNOSIS — I10 Essential (primary) hypertension: Secondary | ICD-10-CM

## 2016-09-19 DIAGNOSIS — E785 Hyperlipidemia, unspecified: Secondary | ICD-10-CM

## 2016-09-19 DIAGNOSIS — R739 Hyperglycemia, unspecified: Secondary | ICD-10-CM

## 2016-09-19 NOTE — Progress Notes (Signed)
Subjective:    Patient ID: Douglas Hammond., male    DOB: 1945/08/19, 71 y.o.   MRN: [id number]  HPI The patient is here for follow up of his BP which has been on the low side at times.  Hypertension, Lightheadedness: He is taking his medication daily. He is compliant with a low sodium diet.  He denies chest pain, palpitations, edema, shortness of breath and regular headaches. He has been experiencing lightheadedness, especially after exercising intensely. He is exercising regularly- Approximately 5 times a week.  He does monitor his blood pressure at home.  140's/80's often, but after exercise he has seen numbers with a systolic less than 750 and was admitted days he has felt lightheaded. Some days he can exercise 2-3 hours intensely. He does drink plenty of water. The other day he drank 2 gallons of water. When he was lightheaded a different day he drinks salt water and felt better. He did try stopping one of his medications and his blood pressure was too high. He is unsure if his blood pressure cuff is accurate or not.    Medications and allergies reviewed with patient and updated if appropriate.  Patient Active Problem List   Diagnosis Date Noted  . 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 Right ankle swelling 07/21/2015  . Right carotid bruit 12/28/2012  . Hyperglycemia 10/16/2012  . 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 Essential hypertension 01/26/2009  . ELECTROCARDIOGRAM, ABNORMAL 01/26/2009  . SKIN CANCER, HX OF 01/26/2009  . ATRIAL SEPTAL DEFECT, HX OF 01/26/2009  . Hyperlipidemia 08/13/2006  . 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 Diverticulosis of large intestine 07/27/2006    Current Outpatient Prescriptions on File Prior to Visit  Medication Sig Dispense Refill  . alclomethasone (ACLOVATE) 0.05 % cream Apply 1 application topically daily.     Marland Kitchen amLODipine (NORVASC) 5 MG tablet Take 1 tablet (5 mg total) by mouth daily. 90 tablet 3  .  fexofenadine (ALLEGRA) 180 MG tablet Take 180 mg by mouth daily.    . hydrochlorothiazide (HYDRODIURIL) 25 MG tablet Take 12.5 mg by mouth daily.    . Multiple Vitamins-Minerals (CENTRUM SILVER PO) Take 1 tablet by mouth daily.     . Na Sulfate-K Sulfate-Mg Sulf (SUPREP BOWEL PREP KIT) 17.5-3.13-1.6 GM/180ML SOLN Suprep as directed / no substitutions 354 mL 0  . Probiotic Product (PROBIOTIC FORMULA PO) Take 1 capsule by mouth daily. Align     . VITAMIN E PO Take 1 capsule by mouth daily.      No current facility-administered medications on file prior to visit.     Past Medical History:  Diagnosis Date  . Bowel habit changes 06/2016   on Ketogenic diet/ does not go to the BR but every 3 days  . BPH (benign prostatic hyperplasia)   . Diverticulosis of colon 2008  . External hemorrhoids   . Gilbert's syndrome   . Heart murmur   . History of anesthesia complications    Per pt, gets very light-headed past sedation/ fainted 1 time!  . Hyperlipidemia   . Hypertension   . Skin cancer    X2 in [place]; now seeing Dr Georgia Duff    Past Surgical History:  Procedure Laterality Date  . ASD [place] EXTRACTION  July 2015   bilateral eyes; [place] Thyra Breed, [facility]  . COLONOSCOPY  2008   [facility]  . INGUINAL HERNIA REPAIR  2006  .  [place]  . open heart   9475   71 year old  . TONSILLECTOMY     as child    Social History   Social History  . Marital status: Married    Spouse name: N/A  . Number of children: N/A  . Years of education: N/A   Social History Main Topics  . Smoking status: Never Smoker  . Smokeless tobacco: Never Used  . Alcohol use Yes     Comment:  1-2 every 3 weeks  . Drug use: No  . Sexual activity: Not on file   Other Topics Concern  . Not on file   Social History Narrative  . No narrative on file    Family History  Problem Relation Age of Onset  . Hypertension Mother   . Emphysema  Father        smoker  . Cancer Neg Hx   . Diabetes Neg Hx   . Heart disease Neg Hx   . Stroke Neg Hx     Review of Systems  Constitutional: Negative for fever.  Respiratory: Negative for shortness of breath.   Cardiovascular: Negative for chest pain, palpitations and leg swelling.  Neurological: Positive for light-headedness and headaches (mild).       Objective:   Vitals:   09/19/16 1621  BP: 128/72  Pulse: 60  Resp: 16  Temp: 97.9 F (36.6 C)  SpO2: 98%   Wt Readings from Last 3 Encounters:  09/19/16 170 lb (77.1 kg)  09/12/16 171 lb 12.8 oz (77.9 kg)  07/07/16 178 lb (80.7 kg)   Body mass index is 23.06 kg/m.   Physical Exam    Constitutional: Appears well-developed and well-nourished. No distress.  HENT:  Head: Normocephalic and atraumatic.  Neck: Neck supple. No tracheal deviation present. No thyromegaly present.  No cervical lymphadenopathy Cardiovascular: Normal rate, regular rhythm and normal heart sounds.   No murmur heard. No carotid bruit .  No edema Pulmonary/Chest: Effort normal and breath sounds normal. No respiratory distress. No has no wheezes. No rales.  Skin: Skin is warm and dry. Not diaphoretic.  Psychiatric: Normal mood and affect. Behavior is normal.      Assessment & Plan:    See Problem List for Assessment and Plan of chronic medical problems.

## 2016-09-19 NOTE — Assessment & Plan Note (Signed)
Blood pressure variable-low after exercise to the point that he is lightheaded and high other times, especially if he holds his medication He is unsure if his blood pressure cuff is accurate and will first start by getting a new cuff  Encouraged him to try to check his blood pressure after exercise when he has lightheadedness He will also try experimenting with this medication-try holding his hydrochlorothiazide since he may be dehydration his blood pressure may be dropping too low with exercise. He can also try taking an extra half of the amlodipine that was days if needed so we can try avoiding the diuretic He is doing keto diet and has lost weight.  He was supposed to increase his sodium intake with this diet and will try drinking salt water with exercise as well to see if that helps He will update me via mychart Follow-up in a couple of weeks for physical-we will defer blood work until that time

## 2016-09-19 NOTE — Assessment & Plan Note (Signed)
Occurring during and just after exercise-he has not checked his blood pressure immediately at that time, but checking an hour or so later it has been on the low side Lightheadedness likely related to hypotension from intense exercise/dehydration He is drinking plenty of water, but drinking salt water has helped Will try holding the hydrochlorothiazide, but advised he may need an extra dose of amlodipine or half of a pill and his blood pressure is elevated He will try checking his blood pressure immediately after exercise it symptomatic We'll defer blood work until his physical in a couple of weeks

## 2016-09-19 NOTE — Patient Instructions (Signed)
  Medications reviewed and updated.  No changes recommended at this time.  Adjust and play with your medications as we discussed.

## 2016-09-25 ENCOUNTER — Encounter: Payer: Medicare Other | Admitting: Internal Medicine

## 2016-10-09 NOTE — Progress Notes (Signed)
Subjective:    Patient ID: Douglas Johnston., male    DOB: 1945-10-19, 71 y.o.   MRN: [id number]  HPI He is here for a physical exam.   Not taking the hctz prior to intense exercise has relieved the lightheadedness when exercising.  He is trying to drink enough water.  His BP tends to be high in the morning and drops down during the day and before bed it goes up a little.  His highest BP tends to 148/88  Medications and allergies reviewed with patient and updated if appropriate.  Patient Active Problem List   Diagnosis Date Noted  . Lightheadedness 09/19/2016  . 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 Right ankle swelling 07/21/2015  . Right carotid bruit 12/28/2012  . Hyperglycemia 10/16/2012  . 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 Essential hypertension 01/26/2009  . ELECTROCARDIOGRAM, ABNORMAL 01/26/2009  . SKIN CANCER, HX OF 01/26/2009  . ATRIAL SEPTAL DEFECT, HX OF 01/26/2009  . Hyperlipidemia 08/13/2006  . 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 Diverticulosis of large intestine 07/27/2006    Current Outpatient Prescriptions on File Prior to Visit  Medication Sig Dispense Refill  . alclomethasone (ACLOVATE) 0.05 % cream Apply 1 application topically daily.     Marland Kitchen amLODipine (NORVASC) 5 MG tablet Take 1 tablet (5 mg total) by mouth daily. 90 tablet 3  . fexofenadine (ALLEGRA) 180 MG tablet Take 180 mg by mouth daily.    . Multiple Vitamins-Minerals (CENTRUM SILVER PO) Take 1 tablet by mouth daily.     . Na Sulfate-K Sulfate-Mg Sulf (SUPREP BOWEL PREP KIT) 17.5-3.13-1.6 GM/180ML SOLN Suprep as directed / no substitutions 354 mL 0  . Probiotic Product (PROBIOTIC FORMULA PO) Take 1 capsule by mouth daily. Align     . VITAMIN E PO Take 1 capsule by mouth daily.      No current facility-administered medications on file prior to visit.     Past Medical History:  Diagnosis Date  . Bowel habit changes 06/2016   on Ketogenic diet/ does not go to the BR  but every 3 days  . BPH (benign prostatic hyperplasia)   . Diverticulosis of colon 2008  . External hemorrhoids   . Gilbert's syndrome   . Heart murmur   . History of anesthesia complications    Per pt, gets very light-headed past sedation/ fainted 1 time!  . Hyperlipidemia   . Hypertension   . Skin cancer    X2 in [place]; now seeing Dr Georgia Duff    Past Surgical History:  Procedure Laterality Date  . ASD [place] EXTRACTION  July 2015   bilateral eyes; Dr Thyra Breed, [facility]  . COLONOSCOPY  2008   [facility]  . INGUINAL HERNIA REPAIR  2006  . [place]  . open heart   [id number]   71 year old  . TONSILLECTOMY     as child    Social History   Social History  . Marital status: Married    Spouse name: N/A  . Number of children: N/A  . Years of education: N/A   Social History Main Topics  . Smoking status: Never Smoker  . Smokeless tobacco: Never Used  . Alcohol use Yes     Comment:  1-2 every 3 weeks  . Drug use: No  . Sexual activity: Not Asked   Other Topics Concern  . None  Social History Narrative  . None    Family History  Problem Relation Age of Onset  . Hypertension Mother   . Emphysema Father        smoker  . Cancer Neg Hx   . Diabetes Neg Hx   . Heart disease Neg Hx   . Stroke Neg Hx     Review of Systems  Constitutional: Negative for chills, fatigue and fever.  Eyes: Negative for visual disturbance.  Respiratory: Negative for cough, shortness of breath and wheezing.   Cardiovascular: Negative for chest pain, palpitations and leg swelling.  Gastrointestinal: Negative for abdominal pain, blood in stool, constipation, diarrhea and nausea.       Occ gerd  Genitourinary: Negative for difficulty urinating, dysuria and hematuria.  Musculoskeletal: Positive for arthralgias (left shoulder).  Skin: Negative for color change and rash.  Neurological: Positive for light-headedness  (intermittent - dehydration or low BP) and headaches (occasional).  Psychiatric/Behavioral: Negative for dysphoric mood and sleep disturbance. The patient is not nervous/anxious.        Objective:   Vitals:   10/10/16 0808  BP: 136/84  Pulse: 62  Resp: 16  Temp: 98.1 F (36.7 C)  SpO2: 98%   Filed Weights   10/10/16 0808  Weight: 167 lb (75.8 kg)   Body mass index is 22.65 kg/m.  Wt Readings from Last 3 Encounters:  10/10/16 167 lb (75.8 kg)  09/19/16 170 lb (77.1 kg)  09/12/16 171 lb 12.8 oz (77.9 kg)     Physical Exam Constitutional: He appears well-developed and well-nourished. No distress.  HENT:  Head: Normocephalic and atraumatic.  Right Ear: External ear normal.  Left Ear: External ear normal.  Mouth/Throat: Oropharynx is clear and moist.  Normal ear canals and TM b/l  Eyes: Conjunctivae and EOM are normal.  Neck: Neck supple. No tracheal deviation present. No thyromegaly present.  No carotid bruit  Cardiovascular: Normal rate, regular rhythm, normal heart sounds and intact distal pulses.   No murmur heard. Pulmonary/Chest: Effort normal and breath sounds normal. No respiratory distress. He has no wheezes. He has no rales.  Abdominal: Soft. He exhibits no distension. There is no tenderness.  Genitourinary: deferred  Musculoskeletal: He exhibits no edema.  Lymphadenopathy:   He has no cervical adenopathy.  Skin: Skin is warm and dry. He is not diaphoretic.  Psychiatric: He has a normal mood and affect. His behavior is normal.         Assessment & Plan:   Physical exam: Screening blood work  ordered Immunizations   Flu due, discussed shingrix, td and pneumonia vaccines up to date Colonoscopy   Last done 2008 -  Due now - will schedule Eye exams  Up to date  EKG   Last done 06/2016 Exercise   Regular at gym Weight   BMi normal  Skin no concerns, sees derm annually Substance abuse   none  See Problem List for Assessment and Plan of chronic medical  problems.

## 2016-10-09 NOTE — Patient Instructions (Addendum)
Test(s) ordered today. Your results will be released to [place] (or called to you) after review, usually within 72hours after test completion. If any changes need to be made, you will be notified at that same time.  All other Health Maintenance issues reviewed.   All recommended immunizations and age-appropriate screenings are up-to-date or discussed.  Flu immunization administered today.   Medications reviewed and updated.  Changes include stopping the hydrochlorothiazide.  Continue to monitor your BP at home.   Please followup in 6 months    Health Maintenance, Male A healthy lifestyle and preventive care is important for your health and wellness. Ask your health care provider about what schedule of regular examinations is right for you. What should I know about weight and diet? Eat a Healthy Diet  Eat plenty of vegetables, fruits, whole grains, low-fat dairy products, and lean protein.  Do not eat a lot of foods high in solid fats, added sugars, or salt.  Maintain a Healthy Weight Regular exercise can help you achieve or maintain a healthy weight. You should:  Do at least 150 minutes of exercise each week. The exercise should increase your heart rate and make you sweat (moderate-intensity exercise).  Do strength-training exercises at least twice a week.  Watch Your Levels of Cholesterol and Blood Lipids  Have your blood tested for lipids and cholesterol every 5 years starting at 73 years of age. If you are at high risk for heart disease, you should start having your blood tested when you are 73 years old. You may need to have your cholesterol levels checked more often if: ? Your lipid or cholesterol levels are high. ? You are older than 73 years of age. ? You are at high risk for heart disease.  What should I know about cancer screening? Many types of cancers can be detected early and may often be prevented. Lung Cancer  You should be screened every year for lung cancer  if: ? You are a current smoker who has smoked for at least 30 years. ? You are a former smoker who has quit within the past 15 years.  Talk to your health care provider about your screening options, when you should start screening, and how often you should be screened.  Colorectal Cancer  Routine colorectal cancer screening usually begins at 73 years of age and should be repeated every 5-10 years until you are 73 years old. You may need to be screened more often if early forms of precancerous polyps or small growths are found. Your health care provider may recommend screening at an earlier age if you have risk factors for colon cancer.  Your health care provider may recommend using home test kits to check for hidden blood in the stool.  A small camera at the end of a tube can be used to examine your colon (sigmoidoscopy or colonoscopy). This checks for the earliest forms of colorectal cancer.  Prostate and Testicular Cancer  Depending on your age and overall health, your health care provider may do certain tests to screen for prostate and testicular cancer.  Talk to your health care provider about any symptoms or concerns you have about testicular or prostate cancer.  Skin Cancer  Check your skin from head to toe regularly.  Tell your health care provider about any new moles or changes in moles, especially if: ? There is a change in a mole's size, shape, or color. ? You have a mole that is larger than a pencil eraser.  Always use sunscreen. Apply sunscreen liberally and repeat throughout the day.  Protect yourself by wearing long sleeves, pants, a wide-brimmed hat, and sunglasses when outside.  What should I know about heart disease, diabetes, and high blood pressure?  If you are 11-54 years of age, have your blood pressure checked every 3-5 years. If you are 24 years of age or older, have your blood pressure checked every year. You should have your blood pressure measured  twice-once when you are at a hospital or clinic, and once when you are not at a hospital or clinic. Record the average of the two measurements. To check your blood pressure when you are not at a hospital or clinic, you can use: ? An automated blood pressure machine at a pharmacy. ? A home blood pressure monitor.  Talk to your health care provider about your target blood pressure.  If you are between 56-35 years old, ask your health care provider if you should take aspirin to prevent heart disease.  Have regular diabetes screenings by checking your fasting blood sugar level. ? If you are at a normal weight and have a low risk for diabetes, have this test once every three years after the age of 11. ? If you are overweight and have a high risk for diabetes, consider being tested at a younger age or more often.  A one-time screening for abdominal aortic aneurysm (AAA) by ultrasound is recommended for men aged 4-75 years who are current or former smokers. What should I know about preventing infection? Hepatitis B If you have a higher risk for hepatitis B, you should be screened for this virus. Talk with your health care provider to find out if you are at risk for hepatitis B infection. Hepatitis C Blood testing is recommended for:  Everyone born from [birth year] through 1967.  Anyone with known risk factors for hepatitis C.  Sexually Transmitted Diseases (STDs)  You should be screened each year for STDs including gonorrhea and chlamydia if: ? You are sexually active and are younger than 73 years of age. ? You are older than 73 years of age and your health care provider tells you that you are at risk for this type of infection. ? Your sexual activity has changed since you were last screened and you are at an increased risk for chlamydia or gonorrhea. Ask your health care provider if you are at risk.  Talk with your health care provider about whether you are at high risk of being infected with HIV.  Your health care provider may recommend a prescription medicine to help prevent HIV infection.  What else can I do?  Schedule regular health, dental, and eye exams.  Stay current with your vaccines (immunizations).  Do not use any tobacco products, such as cigarettes, chewing tobacco, and e-cigarettes. If you need help quitting, ask your health care provider.  Limit alcohol intake to no more than 2 drinks per day. One drink equals 12 ounces of beer, 5 ounces of wine, or 1 ounces of hard liquor.  Do not use street drugs.  Do not share needles.  Ask your health care provider for help if you need support or information about quitting drugs.  Tell your health care provider if you often feel depressed.  Tell your health care provider if you have ever been abused or do not feel safe at home. This information is not intended to replace advice given to you by your health care provider. Make sure you discuss any  questions you have with your health care provider. Document Released: 06/24/2007 Document Revised: 08/25/2015 Document Reviewed: 09/29/2014 Elsevier Interactive Patient Education  [employer].

## 2016-10-10 ENCOUNTER — Ambulatory Visit (INDEPENDENT_AMBULATORY_CARE_PROVIDER_SITE_OTHER): Payer: Medicare Other | Admitting: Internal Medicine

## 2016-10-10 ENCOUNTER — Other Ambulatory Visit (INDEPENDENT_AMBULATORY_CARE_PROVIDER_SITE_OTHER): Payer: Medicare Other

## 2016-10-10 ENCOUNTER — Encounter: Payer: Self-pay | Admitting: Internal Medicine

## 2016-10-10 VITALS — BP 136/84 | HR 62 | Temp 98.1°F | Resp 16 | Ht 72.0 in | Wt 167.0 lb

## 2016-10-10 DIAGNOSIS — R42 Dizziness and giddiness: Secondary | ICD-10-CM

## 2016-10-10 DIAGNOSIS — E785 Hyperlipidemia, unspecified: Secondary | ICD-10-CM

## 2016-10-10 DIAGNOSIS — R739 Hyperglycemia, unspecified: Secondary | ICD-10-CM

## 2016-10-10 DIAGNOSIS — I10 Essential (primary) hypertension: Secondary | ICD-10-CM

## 2016-10-10 DIAGNOSIS — Z23 Encounter for immunization: Secondary | ICD-10-CM | POA: Diagnosis not present

## 2016-10-10 DIAGNOSIS — Z85828 Personal history of other malignant neoplasm of skin: Secondary | ICD-10-CM

## 2016-10-10 DIAGNOSIS — Z0000 Encounter for general adult medical examination without abnormal findings: Secondary | ICD-10-CM

## 2016-10-10 LAB — CBC WITH DIFFERENTIAL/PLATELET
Basophils Absolute: 0 10*3/uL (ref 0.0–0.1)
Basophils Relative: 0.7 % (ref 0.0–3.0)
Eosinophils Absolute: 0.1 10*3/uL (ref 0.0–0.7)
Eosinophils Relative: 1.5 % (ref 0.0–5.0)
HCT: 39.2 % (ref 39.0–52.0)
Hemoglobin: 13.2 g/dL (ref 13.0–17.0)
Lymphocytes Relative: 31 % (ref 12.0–46.0)
Lymphs Abs: 1.6 10*3/uL (ref 0.7–4.0)
MCHC: 33.6 g/dL (ref 30.0–36.0)
MCV: 95.1 fl (ref 78.0–100.0)
Monocytes Absolute: 0.5 10*3/uL (ref 0.1–1.0)
Monocytes Relative: 9 % (ref 3.0–12.0)
Neutro Abs: 2.9 10*3/uL (ref 1.4–7.7)
Neutrophils Relative %: 57.8 % (ref 43.0–77.0)
Platelets: 243 10*3/uL (ref 150.0–400.0)
RBC: 4.12 Mil/uL — ABNORMAL LOW (ref 4.22–5.81)
RDW: 13.4 % (ref 11.5–15.5)
WBC: 5.1 10*3/uL (ref 4.0–10.5)

## 2016-10-10 LAB — HEMOGLOBIN A1C: Hgb A1c MFr Bld: 5.6 % (ref 4.6–6.5)

## 2016-10-10 LAB — COMPREHENSIVE METABOLIC PANEL
ALT: 12 U/L (ref 0–53)
AST: 15 U/L (ref 0–37)
Albumin: 4.3 g/dL (ref 3.5–5.2)
Alkaline Phosphatase: 47 U/L (ref 39–117)
BUN: 15 mg/dL (ref 6–23)
CO2: 30 mEq/L (ref 19–32)
Calcium: 9.1 mg/dL (ref 8.4–10.5)
Chloride: 101 mEq/L (ref 96–112)
Creatinine, Ser: 0.86 mg/dL (ref 0.40–1.50)
GFR: 92.71 mL/min (ref >60.00)
Glucose, Bld: 94 mg/dL (ref 70–99)
Potassium: 4.2 mEq/L (ref 3.5–5.1)
Sodium: 139 mEq/L (ref 135–145)
Total Bilirubin: 0.8 mg/dL (ref 0.2–1.2)
Total Protein: 7.1 g/dL (ref 6.0–8.3)

## 2016-10-10 LAB — MAGNESIUM: Magnesium: 2 mg/dL (ref 1.5–2.5)

## 2016-10-10 LAB — LIPID PANEL
Cholesterol: 186 mg/dL (ref 0–200)
HDL: 53.9 mg/dL (ref >39.00)
LDL Cholesterol: 118 mg/dL — ABNORMAL HIGH (ref 0–99)
NonHDL: 132.37
Total CHOL/HDL Ratio: 3
Triglycerides: 70 mg/dL (ref 0.0–149.0)
VLDL: 14 mg/dL (ref 0.0–40.0)

## 2016-10-10 LAB — TSH: TSH: 3.83 u[IU]/mL (ref 0.35–4.50)

## 2016-10-10 NOTE — Assessment & Plan Note (Signed)
Currently on a high fat diet Check lipid panel, cmp

## 2016-10-10 NOTE — Assessment & Plan Note (Signed)
Has been experiencing lightheadedness and dehydration easily with exercise Better off hctz, d/c hctz Continue amlodipine 5 mg daily Will monitor at home May need to add second medication if BP continues to be elevated in morning and evening - losartan may be good to try cmp

## 2016-10-10 NOTE — Assessment & Plan Note (Signed)
Sees derm annually 

## 2016-10-10 NOTE — Assessment & Plan Note (Signed)
a1c today

## 2016-10-11 ENCOUNTER — Encounter: Payer: Self-pay | Admitting: Internal Medicine

## 2016-11-10 ENCOUNTER — Encounter: Payer: Self-pay | Admitting: Internal Medicine

## 2016-11-18 ENCOUNTER — Ambulatory Visit: Payer: Medicare Other | Admitting: Family Medicine

## 2016-11-18 ENCOUNTER — Encounter: Payer: Self-pay | Admitting: Family Medicine

## 2016-11-18 VITALS — BP 126/82 | HR 72 | Temp 98.2°F | Wt 164.0 lb

## 2016-11-18 DIAGNOSIS — R05 Cough: Secondary | ICD-10-CM | POA: Diagnosis not present

## 2016-11-18 DIAGNOSIS — R059 Cough, unspecified: Secondary | ICD-10-CM

## 2016-11-18 MED ORDER — HYDROCODONE-HOMATROPINE 5-1.5 MG/5ML PO SYRP: 5.0000 mL | ORAL_SOLUTION | Freq: Four times a day (QID) | ORAL | 0 refills | Status: DC | PRN

## 2016-11-18 NOTE — Patient Instructions (Signed)
Cough, Adult Coughing is a reflex that clears your throat and your airways. Coughing helps to heal and protect your lungs. It is normal to cough occasionally, but a cough that happens with other symptoms or lasts a long time may be a sign of a condition that needs treatment. A cough may last only 2-3 weeks (acute), or it may last longer than 8 weeks (chronic). What are the causes? Coughing is commonly caused by:  Breathing in substances that irritate your lungs.  A viral or bacterial respiratory infection.  Allergies.  Asthma.  Postnasal drip.  Smoking.  Acid backing up from the stomach into the esophagus (gastroesophageal reflux).  Certain medicines.  Chronic lung problems, including COPD (or rarely, lung cancer).  Other medical conditions such as heart failure.  Follow these instructions at home: Pay attention to any changes in your symptoms. Take these actions to help with your discomfort:  Take medicines only as told by your health care provider. ? If you were prescribed an antibiotic medicine, take it as told by your health care provider. Do not stop taking the antibiotic even if you start to feel better. ? Talk with your health care provider before you take a cough suppressant medicine.  Drink enough fluid to keep your urine clear or pale yellow.  If the air is dry, use a cold steam vaporizer or humidifier in your bedroom or your home to help loosen secretions.  Avoid anything that causes you to cough at work or at home.  If your cough is worse at night, try sleeping in a semi-upright position.  Avoid cigarette smoke. If you smoke, quit smoking. If you need help quitting, ask your health care provider.  Avoid caffeine.  Avoid alcohol.  Rest as needed.  Contact a health care provider if:  You have new symptoms.  You cough up pus.  Your cough does not get better after 2-3 weeks, or your cough gets worse.  You cannot control your cough with suppressant  medicines and you are losing sleep.  You develop pain that is getting worse or pain that is not controlled with pain medicines.  You have a fever.  You have unexplained weight loss.  You have night sweats. Get help right away if:  You cough up blood.  You have difficulty breathing.  Your heartbeat is very fast. This information is not intended to replace advice given to you by your health care provider. Make sure you discuss any questions you have with your health care provider. Document Released: 06/24/2010 Document Revised: 06/03/2015 Document Reviewed: 03/04/2014 Elsevier Interactive Patient Education  2017 Elsevier Inc.  

## 2016-11-18 NOTE — Progress Notes (Signed)
Subjective:     Patient ID: Douglas Johnston., male   DOB: 1945-06-22, 71 y.o.   MRN: [id number]  HPI Patient seen Saturday clinic with cough which started couple days ago along with some nasal congestion and sore throat. Mild body aches. Denies any nausea, vomiting, or diarrhea. No fever. No sick contacts. He tried Robitussin-DM and Mucinex with minimal relief. Cough especially bothersome at night. Patient denies any chronic lung issues. Nonsmoker.  Past Medical History:  Diagnosis Date  . Bowel habit changes 06/2016   on Ketogenic diet/ does not go to the BR but every 3 days  . BPH (benign prostatic hyperplasia)   . Diverticulosis of colon 2008  . External hemorrhoids   . Gilbert's syndrome   . Heart murmur   . History of anesthesia complications    Per pt, gets very light-headed past sedation/ fainted 1 time!  . Hyperlipidemia   . Hypertension   . Skin cancer    X2 in [place]; now seeing Dr Georgia Duff   Past Surgical History:  Procedure Laterality Date  . ASD [place] EXTRACTION  July 2015   bilateral eyes; Dr Thyra Breed, [facility]  . COLONOSCOPY  2008   [facility]  . INGUINAL HERNIA REPAIR  2006  . [place]  . open heart   [id number]   72 year old  . TONSILLECTOMY     as child    reports that  has never smoked. he has never used smokeless tobacco. He reports that he drinks alcohol. He reports that he does not use drugs. family history includes Emphysema in his father; Hypertension in his mother. No Known Allergies   Review of Systems  Constitutional: Negative for chills and fever.  HENT: Positive for congestion and sore throat.   Respiratory: Positive for cough. Negative for wheezing.   Cardiovascular: Negative for chest pain.       Objective:   Physical Exam  Constitutional: He appears well-developed and well-nourished.  HENT:  Head: Normocephalic.  Right Ear: External ear normal.  Left Ear:  External ear normal.  Mouth/Throat: Oropharynx is clear and moist.  Neck: Neck supple.  Cardiovascular: Normal rate and regular rhythm.  Pulmonary/Chest: Effort normal and breath sounds normal. No respiratory distress. He has no wheezes. He has no rales.  Lymphadenopathy:    He has no cervical adenopathy.       Assessment:     Cough.. Suspect acute viral bronchitis. Nonfocal exam    Plan:     -Hycodan cough syrup 1 teaspoon every 6 hours prn for severe cough -Follow-up promptly for any fever, shortness of breath, or any other concerning new symptoms  Eulas Post MD [facility]

## 2016-11-27 ENCOUNTER — Telehealth: Payer: Self-pay | Admitting: Internal Medicine

## 2016-11-27 MED ORDER — HYDROCODONE-HOMATROPINE 5-1.5 MG/5ML PO SYRP: 5.0000 mL | ORAL_SOLUTION | Freq: Four times a day (QID) | ORAL | 0 refills | Status: AC | PRN

## 2016-11-27 MED ORDER — HYDROCODONE-HOMATROPINE 5-1.5 MG/5ML PO SYRP: 5.0000 mL | ORAL_SOLUTION | Freq: Four times a day (QID) | ORAL | 0 refills | Status: DC | PRN

## 2016-11-27 NOTE — Telephone Encounter (Signed)
Pt wife called for a refill of his HYDROcodone-homatropine (HYCODAN) 5-1.5 MG/5ML  States he cannot shake his cough,  Please advise

## 2016-11-27 NOTE — Telephone Encounter (Signed)
Spoke with pt's wife to inform.  

## 2016-11-27 NOTE — Telephone Encounter (Signed)
Sent to pof 

## 2016-12-29 ENCOUNTER — Encounter: Payer: Self-pay | Admitting: Internal Medicine

## 2016-12-30 ENCOUNTER — Other Ambulatory Visit: Payer: Self-pay | Admitting: Internal Medicine

## 2017-01-04 ENCOUNTER — Ambulatory Visit (AMBULATORY_SURGERY_CENTER): Payer: Self-pay | Admitting: *Deleted

## 2017-01-04 ENCOUNTER — Other Ambulatory Visit: Payer: Self-pay

## 2017-01-04 VITALS — Ht 72.0 in | Wt 165.0 lb

## 2017-01-04 DIAGNOSIS — Z1211 Encounter for screening for malignant neoplasm of colon: Secondary | ICD-10-CM

## 2017-01-04 NOTE — Progress Notes (Signed)
Patient denies any allergies to eggs or soy. Patient denies any problems with anesthesia/sedation. Patient denies any oxygen use at home. Patient denies taking any diet/weight loss medications or blood thinners. EMMI education assisgned to patient on colonoscopy, this was explained and instructions given to patient. Patient has constipation, taking Metamucil everyday, 2 day Miralax/Suprep instructions given. Patient has Suprep at home from last PV.

## 2017-01-18 ENCOUNTER — Ambulatory Visit (AMBULATORY_SURGERY_CENTER): Payer: Medicare Other | Admitting: Internal Medicine

## 2017-01-18 ENCOUNTER — Encounter: Payer: Self-pay | Admitting: Internal Medicine

## 2017-01-18 VITALS — BP 147/73 | HR 70 | Temp 97.5°F | Resp 17 | Ht 72.0 in | Wt 165.0 lb

## 2017-01-18 DIAGNOSIS — D125 Benign neoplasm of sigmoid colon: Secondary | ICD-10-CM | POA: Diagnosis not present

## 2017-01-18 DIAGNOSIS — Z1211 Encounter for screening for malignant neoplasm of colon: Secondary | ICD-10-CM

## 2017-01-18 DIAGNOSIS — D120 Benign neoplasm of cecum: Secondary | ICD-10-CM | POA: Diagnosis not present

## 2017-01-18 DIAGNOSIS — D123 Benign neoplasm of transverse colon: Secondary | ICD-10-CM | POA: Diagnosis not present

## 2017-01-18 DIAGNOSIS — D122 Benign neoplasm of ascending colon: Secondary | ICD-10-CM | POA: Diagnosis not present

## 2017-01-18 MED ORDER — SODIUM CHLORIDE 0.9 % IV SOLN: 500.0000 mL | Freq: Once | INTRAVENOUS | Status: DC

## 2017-01-18 NOTE — Progress Notes (Signed)
Called to room to assist during endoscopic procedure.  Patient ID and intended procedure confirmed with present staff. Received instructions for my participation in the procedure from the performing physician.  

## 2017-01-18 NOTE — Op Note (Signed)
Douglas Johnston Patient Name: Douglas Johnston Procedure Date: 01/18/2017 9:32 AM MRN: [id number] Endoscopist: Jerene Bears , MD Age: 72 Referring MD:  Date of Birth: 1945-02-25 Gender: Male Account #: [id number] Procedure:                Colonoscopy Indications:              Screening for colorectal malignant neoplasm, Last                            colonoscopy 10 years ago Medicines:                Monitored Anesthesia Care Procedure:                Pre-Anesthesia Assessment:                           - Prior to the procedure, a History and Physical                            was performed, and patient medications and                            allergies were reviewed. The patient's tolerance of                            previous anesthesia was also reviewed. The risks                            and benefits of the procedure and the sedation                            options and risks were discussed with the patient.                            All questions were answered, and informed consent                            was obtained. Prior Anticoagulants: The patient has                            taken no previous anticoagulant or antiplatelet                            agents. ASA Grade Assessment: II - A patient with                            mild systemic disease. After reviewing the risks                            and benefits, the patient was deemed in                            satisfactory condition to undergo the procedure.  After obtaining informed consent, the colonoscope                            was passed under direct vision. Throughout the                            procedure, the patient's blood pressure, pulse, and                            oxygen saturations were monitored continuously. The                            Colonoscope was introduced through the anus and                            advanced to the the cecum,  identified by                            appendiceal orifice and ileocecal valve. The                            colonoscopy was performed without difficulty. The                            patient tolerated the procedure well. The quality                            of the bowel preparation was good. The ileocecal                            valve, appendiceal orifice, and rectum were                            photographed. Scope In: 9:36:11 AM Scope Out: 9:59:16 AM Scope Withdrawal Time: 0 hours 16 minutes 26 seconds  Total Procedure Duration: 0 hours 23 minutes 5 seconds  Findings:                 The digital rectal exam was normal.                           A 5 mm polyp was found in the cecum. The polyp was                            sessile. The polyp was removed with a cold snare.                            Resection and retrieval were complete.                           A 5 mm polyp was found in the ileocecal valve. The                            polyp was sessile. The polyp was removed  with a                            cold snare. Resection and retrieval were complete.                           A 5 mm polyp was found in the ascending colon. The                            polyp was sessile. The polyp was removed with a                            cold snare. Resection and retrieval were complete.                           Two sessile polyps were found in the transverse                            colon. The polyps were 3 to 5 mm in size. These                            polyps were removed with a cold snare. Resection                            and retrieval were complete.                           A 6 mm polyp was found in the splenic flexure. The                            polyp was sessile. The polyp was removed with a                            cold snare. Resection and retrieval were complete.                           A 6 mm polyp was found in the sigmoid colon. The                             polyp was sessile. The polyp was removed with a                            cold snare. Resection and retrieval were complete.                           Multiple small and large-mouthed diverticula were                            found in the sigmoid colon.                           Internal hemorrhoids were found during  retroflexion. The hemorrhoids were small. Complications:            No immediate complications. Estimated Blood Loss:     Estimated blood loss was minimal. Impression:               - One 5 mm polyp in the cecum, removed with a cold                            snare. Resected and retrieved.                           - One 5 mm polyp at the ileocecal valve, removed                            with a cold snare. Resected and retrieved.                           - One 5 mm polyp in the ascending colon, removed                            with a cold snare. Resected and retrieved.                           - Two 3 to 5 mm polyps in the transverse colon,                            removed with a cold snare. Resected and retrieved.                           - One 6 mm polyp at the splenic flexure, removed                            with a cold snare. Resected and retrieved.                           - One 6 mm polyp in the sigmoid colon, removed with                            a cold snare. Resected and retrieved.                           - Severe diverticulosis in the sigmoid colon.                           - Internal hemorrhoids. Recommendation:           - Patient has a contact number available for                            emergencies. The signs and symptoms of potential                            delayed complications were discussed with the  patient. Return to normal activities tomorrow.                            Written discharge instructions were provided to the                            patient.                            - Resume previous diet.                           - Continue present medications.                           - Await pathology results.                           - Repeat colonoscopy is recommended. The                            colonoscopy date will be determined after pathology                            results from today's exam become available for                            review. Jerene Bears, MD 01/18/2017 10:09:09 AM This report has been signed electronically.

## 2017-01-18 NOTE — Patient Instructions (Signed)
YOU HAD AN ENDOSCOPIC PROCEDURE TODAY AT [facility]:   Refer to the procedure report that was given to you for any specific questions about what was found during the examination.  If the procedure report does not answer your questions, please call your gastroenterologist to clarify.  If you requested that your care partner not be given the details of your procedure findings, then the procedure report has been included in a sealed envelope for you to review at your convenience later.  YOU SHOULD EXPECT: Some feelings of bloating in the abdomen. Passage of more gas than usual.  Walking can help get rid of the air that was put into your GI tract during the procedure and reduce the bloating. If you had a lower endoscopy (such as a colonoscopy or flexible sigmoidoscopy) you may notice spotting of blood in your stool or on the toilet paper. If you underwent a bowel prep for your procedure, you may not have a normal bowel movement for a few days.  Please Note:  You might notice some irritation and congestion in your nose or some drainage.  This is from the oxygen used during your procedure.  There is no need for concern and it should clear up in a day or so.  SYMPTOMS TO REPORT IMMEDIATELY:   Following lower endoscopy (colonoscopy or flexible sigmoidoscopy):  Excessive amounts of blood in the stool  Significant tenderness or worsening of abdominal pains  Swelling of the abdomen that is new, acute  Fever of 100F or higher   Please see handouts given on polyps, Diverticulosis and Hemorrhoids.  For urgent or emergent issues, a gastroenterologist can be reached at any hour by calling [phone].   DIET:  We do recommend a small meal at first, but then you may proceed to your regular diet.  Drink plenty of fluids but you should avoid alcoholic beverages for 24 hours.  ACTIVITY:  You should plan to take it easy for the rest of today and you should NOT DRIVE or use heavy machinery  until tomorrow (because of the sedation medicines used during the test).    FOLLOW UP: Our staff will call the number listed on your records the next business day following your procedure to check on you and address any questions or concerns that you may have regarding the information given to you following your procedure. If we do not reach you, we will leave a message.  However, if you are feeling well and you are not experiencing any problems, there is no need to return our call.  We will assume that you have returned to your regular daily activities without incident.  If any biopsies were taken you will be contacted by phone or by letter within the next 1-3 weeks.  Please call us at [phone] if you have not heard about the biopsies in 3 weeks.    SIGNATURES/CONFIDENTIALITY: You and/or your care partner have signed paperwork which will be entered into your electronic medical record.  These signatures attest to the fact that that the information above on your After Visit Summary has been reviewed and is understood.  Full responsibility of the confidentiality of this discharge information lies with you and/or your care-partner.  Thank you for letting us take care of your healthcare needs today.

## 2017-01-18 NOTE — Progress Notes (Signed)
Report to PACU, RN, vss, BBS= Clear.  

## 2017-01-19 ENCOUNTER — Telehealth: Payer: Self-pay | Admitting: *Deleted

## 2017-01-19 NOTE — Telephone Encounter (Signed)
  Follow up Call-  Call back number 01/18/2017  Post procedure Call Back phone  # [phone]  Permission to leave phone message Yes  Some recent data might be hidden     Patient questions:  Do you have a fever, pain , or abdominal swelling? No. Pain Score  0 *  Have you tolerated food without any problems? Yes.    Have you been able to return to your normal activities? Yes.    Do you have any questions about your discharge instructions: Diet   No. Medications  No. Follow up visit  No.  Do you have questions or concerns about your Care? No.  Actions: * If pain score is 4 or above: No action needed, pain <4.

## 2017-01-24 ENCOUNTER — Encounter: Payer: Self-pay | Admitting: Internal Medicine

## 2017-01-24 DIAGNOSIS — K635 Polyp of colon: Secondary | ICD-10-CM | POA: Insufficient documentation

## 2017-02-06 NOTE — Progress Notes (Signed)
Cardiology Office Note   Date:  02/08/2017   ID:  Douglas Johnston., DOB September 21, 1945, MRN [id number]  PCP:  Binnie Rail, MD  Cardiologist:   Jenkins Rouge, MD   No chief complaint on file.     History of Present Illness: Douglas Johnston. is a 72 y.o. 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 Has been on BP meds for years. Tries to follow low sodium diet. Red wine seems to bring BP down. 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 Ineffective and started on norvasc.  He is active playing racquet ball 6 hours / week   Retired from [employer] from [place] Married no children  Has been on [employer]  Echo reviewed 07/19/16 EF 60-65% mild LVH Trivial MR/AR moderate LAE RV mildly dilated estimated PA 30 mmHg  Bubble study negative right to left shunt  Myovue 07/13/16 no ischemia or infarct EF estimated 50% normal by echo see above   Has issues with low BP on keto diet has lost 23 lbs Dizziness does not sound cardiac in nature   Past Medical History:  Diagnosis Date  . Bowel habit changes 06/2016   on Ketogenic diet/ does not go to the BR but every 3 days  . BPH (benign prostatic hyperplasia)   . Diverticulosis of colon 2008  . External hemorrhoids   . Gilbert's syndrome   . Heart murmur   . History of anesthesia complications    Per pt, gets very light-headed past sedation/ fainted 1 time!  . Hyperlipidemia   . Hypertension   . Skin cancer    X2 in [place]; now seeing Dr Georgia Duff    Past Surgical History:  Procedure Laterality Date  . ASD [place] EXTRACTION  July 2015   bilateral eyes; Dr Thyra Breed, [facility]  . COLONOSCOPY  2008   [facility]  . INGUINAL HERNIA REPAIR  2006  . [place]  . open heart   212   72 year old  . TONSILLECTOMY     as child     Current Outpatient  Medications  Medication Sig Dispense Refill  . alclomethasone (ACLOVATE) 0.05 % cream Apply 1 application topically daily.     Marland Kitchen amLODipine (NORVASC) 5 MG tablet TAKE 1 TABLET BY MOUTH DAILY. 90 tablet 0  . Probiotic Product (PROBIOTIC FORMULA PO) Take 1 capsule by mouth daily. Align      No current facility-administered medications for this visit.     Allergies:   Patient has no known allergies.    Social History:  The patient  reports that  has never smoked. he has never used smokeless tobacco. He reports that he drinks alcohol. He reports that he does not use drugs.   Family History:  The patient's family history includes Emphysema in his father; Hypertension in his mother; Other in his mother.    ROS:  Please see the history of present illness.   Otherwise, review of systems are positive for none.   All other systems are reviewed and negative.    PHYSICAL EXAM: VS:  BP (!) 164/88 Comment: did not take amlodipine lastnight  Pulse (!) 56   Ht 6' (1.829 m)   Wt 165 lb 8 oz (75.1 kg)  BMI 22.45 kg/m  , BMI Body mass index is 22.45 kg/m. Affect appropriate Healthy:  appears stated age 78: normal Neck supple with no adenopathy JVP normal no bruits no thyromegaly Lungs clear with no wheezing and good diaphragmatic motion Heart:  S1/S2 no murmur, no rub, gallop or click PMI normal Abdomen: benighn, BS positve, no tenderness, no AAA no bruit.  No HSM or HJR Distal pulses intact with no bruits No edema Neuro non-focal Skin warm and dry No muscular weakness   EKG:  01/07/14  SR rate 56 PR 260 RBBB / LAFB  07/07/16  SR rate 61 LAE PR 240 msec ICRBBB lateral T wave inversions    Recent Labs: 10/10/2016: ALT 12; BUN 15; Creatinine, Ser 0.86; Hemoglobin 13.2; Magnesium 2.0; Platelets 243.0; Potassium 4.2; Sodium 139; TSH 3.83    Lipid Panel    Component Value Date/Time   CHOL 186 10/10/2016 0836   CHOL 186 01/07/2014 1137   TRIG 70.0 10/10/2016 0836   TRIG 64  01/07/2014 1137   HDL 53.90 10/10/2016 0836   HDL 56 01/07/2014 1137   CHOLHDL 3 10/10/2016 0836   VLDL 14.0 10/10/2016 0836   LDLCALC 118 (H) 10/10/2016 0836   LDLCALC 117 (H) 01/07/2014 1137      Wt Readings from Last 3 Encounters:  02/08/17 165 lb 8 oz (75.1 kg)  01/18/17 165 lb (74.8 kg)  01/04/17 165 lb (74.8 kg)      Other studies Reviewed: Additional studies/ records that were reviewed today include: Notes Dr Quay Burow labs and ECG .    ASSESSMENT AND PLAN:  1. HTN Well controlled.  Continue current medications and low sodium Dash type diet.   2. ASD Repair intact by echo 07/07/16 negative bubble study  3. Varicosities:  F/u Dr Oneida Alar no phlebitis  4. Bifasicular Block: stable yearly ECG no high grade heart block  5. Dizziness:  May need ENT w/u has hearing aids and may have vertigo Would not be too tight with BP control no evidence of bradycardic episodes  Jenkins Rouge

## 2017-02-08 ENCOUNTER — Ambulatory Visit: Payer: Medicare Other | Admitting: Cardiovascular Disease

## 2017-02-08 ENCOUNTER — Encounter: Payer: Self-pay | Admitting: Cardiovascular Disease

## 2017-02-08 VITALS — BP 164/88 | HR 56 | Ht 72.0 in | Wt 165.5 lb

## 2017-02-08 DIAGNOSIS — Q211 Atrial septal defect, unspecified: Secondary | ICD-10-CM

## 2017-02-08 DIAGNOSIS — I10 Essential (primary) hypertension: Secondary | ICD-10-CM | POA: Diagnosis not present

## 2017-02-08 NOTE — Patient Instructions (Addendum)

## 2017-02-16 DIAGNOSIS — L812 Freckles: Secondary | ICD-10-CM | POA: Diagnosis not present

## 2017-02-16 DIAGNOSIS — D2262 Melanocytic nevi of left upper limb, including shoulder: Secondary | ICD-10-CM | POA: Diagnosis not present

## 2017-02-16 DIAGNOSIS — Z85828 Personal history of other malignant neoplasm of skin: Secondary | ICD-10-CM | POA: Diagnosis not present

## 2017-02-16 DIAGNOSIS — L821 Other seborrheic keratosis: Secondary | ICD-10-CM | POA: Diagnosis not present

## 2017-02-16 DIAGNOSIS — D2261 Melanocytic nevi of right upper limb, including shoulder: Secondary | ICD-10-CM | POA: Diagnosis not present

## 2017-04-07 ENCOUNTER — Other Ambulatory Visit: Payer: Self-pay | Admitting: Internal Medicine

## 2017-04-24 DIAGNOSIS — E291 Testicular hypofunction: Secondary | ICD-10-CM | POA: Diagnosis not present

## 2017-04-24 DIAGNOSIS — N400 Enlarged prostate without lower urinary tract symptoms: Secondary | ICD-10-CM | POA: Diagnosis not present

## 2017-05-04 DIAGNOSIS — E291 Testicular hypofunction: Secondary | ICD-10-CM | POA: Diagnosis not present

## 2017-05-04 DIAGNOSIS — N401 Enlarged prostate with lower urinary tract symptoms: Secondary | ICD-10-CM | POA: Diagnosis not present

## 2017-06-06 NOTE — Patient Instructions (Addendum)
  Test(s) ordered today. Your results will be released to MyChart (or called to you) after review, usually within 72hours after test completion. If any changes need to be made, you will be notified at that same time.  Medications reviewed and updated.  No changes recommended at this time.    Please followup in 6 months   

## 2017-06-06 NOTE — Progress Notes (Signed)
w   Subjective:    Patient ID: Douglas Johnston., male    DOB: 09-30-1945, 72 y.o.   MRN: [id number]  HPI The patient is here for follow up.  Lightheadedness:  He has added some carbs into his diet and that has eliminated the lightheadedness he had with exercise.   Hypertension: He is taking his medication daily. He is compliant with a low sodium diet.  He has had some mild leg edema when traveling only.  He is less active when he travels and does not see any swelling on a daily basis.  He denies chest pain, palpitations, shortness of breath and regular headaches. He is exercising regularly.  He does monitor his blood pressure at home.    Hyperglycemia:  He is compliant with a low sugar/carbohydrate diet.  He is exercising regularly.  Hyperlipidemia;  He has never been on medication.  He is eating very healthy and exercising regularly.   Medications and allergies reviewed with patient and updated if appropriate.  Patient Active Problem List   Diagnosis Date Noted  . Colon polyps 01/24/2017  . Lightheadedness 09/19/2016  . 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 Right ankle swelling 07/21/2015  . Right carotid bruit 12/28/2012  . Hyperglycemia 10/16/2012  . 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 Essential hypertension 01/26/2009  . ELECTROCARDIOGRAM, ABNORMAL 01/26/2009  . SKIN CANCER, HX OF 01/26/2009  . ATRIAL SEPTAL DEFECT, HX OF 01/26/2009  . Hyperlipidemia 08/13/2006  . 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 Diverticulosis of large intestine 07/27/2006    Current Outpatient Medications on File Prior to Visit  Medication Sig Dispense Refill  . alclomethasone (ACLOVATE) 0.05 % cream Apply 1 application topically daily.     . Probiotic Product (PROBIOTIC FORMULA PO) Take 1 capsule by mouth daily. Align      No current facility-administered medications on file prior to visit.     Past Medical History:  Diagnosis Date  . Bowel habit changes 06/2016   on Ketogenic diet/ does not go to the BR but every 3 days  . BPH (benign prostatic hyperplasia)   . Diverticulosis of colon 2008  . External hemorrhoids   . Gilbert's syndrome   . Heart murmur   . History of anesthesia complications    Per pt, gets very light-headed past sedation/ fainted 1 time!  . Hyperlipidemia   . Hypertension   . Skin cancer    X2 in [place]; now seeing Dr Georgia Duff    Past Surgical History:  Procedure Laterality Date  . ASD [place] EXTRACTION  July 2015   bilateral eyes; [facility] Thyra Breed, [facility]  . COLONOSCOPY  2008   [facility]  . INGUINAL HERNIA REPAIR  2006  . [place]  . open heart   5480   72 year old  . TONSILLECTOMY     as child    Social History   Socioeconomic History  . Marital status: Married    Spouse name: Not on file  . Number of children: Not on file  . Years of education: Not on file  . Highest education level: Not on file  Occupational History  . Not on file  Social Needs  . Financial resource strain: Not on file  . Food insecurity:    Worry: Not on file    Inability: Not on file  . Transportation needs:    Medical:  Not on file    Non-medical: Not on file  Tobacco Use  . Smoking status: Never Smoker  . Smokeless tobacco: Never Used  Substance and Sexual Activity  . Alcohol use: Yes    Comment: every 2 weeks wine per pt  . Drug use: No  . Sexual activity: Not on file  Lifestyle  . Physical activity:    Days per week: Not on file    Minutes per session: Not on file  . Stress: Not on file  Relationships  . Social connections:    Talks on phone: Not on file    Gets together: Not on file    Attends religious service: Not on file    Active member of club or organization: Not on file    Attends meetings of clubs or organizations: Not on file    Relationship status: Not on file  Other Topics Concern  . Not on file  Social History Narrative  . Not  on file    Family History  Problem Relation Age of Onset  . Hypertension Mother   . Other Mother        intestine blockage-burst  . Emphysema Father        smoker  . Cancer Neg Hx   . Diabetes Neg Hx   . Heart disease Neg Hx   . Stroke Neg Hx   . Colon cancer Neg Hx     Review of Systems  Constitutional: Negative for chills and fever.  Respiratory: Positive for cough (dry). Negative for shortness of breath and wheezing.   Cardiovascular: Positive for leg swelling (with traveling). Negative for chest pain and palpitations.  Neurological: Negative for light-headedness and headaches.       Objective:   Vitals:   06/07/17 0921  BP: 130/76  Pulse: 63  Resp: 16  Temp: (!) 97.4 F (36.3 C)  SpO2: 98%   BP Readings from Last 3 Encounters:  06/07/17 130/76  02/08/17 (!) 164/88  01/18/17 (!) 147/73   Wt Readings from Last 3 Encounters:  06/07/17 162 lb (73.5 kg)  02/08/17 165 lb 8 oz (75.1 kg)  01/18/17 165 lb (74.8 kg)   Body mass index is 21.97 kg/m.   Physical Exam    Constitutional: Appears well-developed and well-nourished. No distress.  HENT:  Head: Normocephalic and atraumatic.  Neck: Neck supple. No tracheal deviation present. No thyromegaly present.  No cervical lymphadenopathy Cardiovascular: Normal rate, regular rhythm and normal heart sounds.   No murmur heard. No carotid bruit .  No edema Pulmonary/Chest: Effort normal and breath sounds normal. No respiratory distress. No has no wheezes. No rales.  Skin: Skin is warm and dry. Not diaphoretic.  Psychiatric: Normal mood and affect. Behavior is normal.      Assessment & Plan:    See Problem List for Assessment and Plan of chronic medical problems.

## 2017-06-07 ENCOUNTER — Encounter: Payer: Self-pay | Admitting: Internal Medicine

## 2017-06-07 ENCOUNTER — Other Ambulatory Visit (INDEPENDENT_AMBULATORY_CARE_PROVIDER_SITE_OTHER): Payer: Medicare Other

## 2017-06-07 ENCOUNTER — Ambulatory Visit: Payer: Medicare Other | Admitting: Internal Medicine

## 2017-06-07 VITALS — BP 130/76 | HR 63 | Temp 97.4°F | Resp 16 | Wt 162.0 lb

## 2017-06-07 DIAGNOSIS — R42 Dizziness and giddiness: Secondary | ICD-10-CM | POA: Diagnosis not present

## 2017-06-07 DIAGNOSIS — I10 Essential (primary) hypertension: Secondary | ICD-10-CM | POA: Diagnosis not present

## 2017-06-07 DIAGNOSIS — E785 Hyperlipidemia, unspecified: Secondary | ICD-10-CM | POA: Diagnosis not present

## 2017-06-07 DIAGNOSIS — R739 Hyperglycemia, unspecified: Secondary | ICD-10-CM

## 2017-06-07 LAB — COMPREHENSIVE METABOLIC PANEL
ALT: 16 U/L (ref 0–53)
AST: 19 U/L (ref 0–37)
Albumin: 4.3 g/dL (ref 3.5–5.2)
Alkaline Phosphatase: 66 U/L (ref 39–117)
BUN: 20 mg/dL (ref 6–23)
CO2: 27 mEq/L (ref 19–32)
Calcium: 9.3 mg/dL (ref 8.4–10.5)
Chloride: 104 mEq/L (ref 96–112)
Creatinine, Ser: 0.83 mg/dL (ref 0.40–1.50)
GFR: 96.41 mL/min (ref >60.00)
Glucose, Bld: 87 mg/dL (ref 70–99)
Potassium: 4.3 mEq/L (ref 3.5–5.1)
Sodium: 139 mEq/L (ref 135–145)
Total Bilirubin: 0.9 mg/dL (ref 0.2–1.2)
Total Protein: 7.5 g/dL (ref 6.0–8.3)

## 2017-06-07 LAB — LIPID PANEL
Cholesterol: 174 mg/dL (ref 0–200)
HDL: 57.1 mg/dL (ref >39.00)
LDL Cholesterol: 91 mg/dL (ref 0–99)
NonHDL: 116.55
Total CHOL/HDL Ratio: 3
Triglycerides: 129 mg/dL (ref 0.0–149.0)
VLDL: 25.8 mg/dL (ref 0.0–40.0)

## 2017-06-07 LAB — HEMOGLOBIN A1C: Hgb A1c MFr Bld: 5.5 % (ref 4.6–6.5)

## 2017-06-07 MED ORDER — AMLODIPINE BESYLATE 5 MG PO TABS: 5.0000 mg | ORAL_TABLET | Freq: Every day | ORAL | 1 refills | Status: DC

## 2017-06-07 NOTE — Assessment & Plan Note (Signed)
Resolved with adding back some carbs into his diet - needed do to regular exercise

## 2017-06-07 NOTE — Assessment & Plan Note (Signed)
Diet controlled Check lipids, cmp

## 2017-06-07 NOTE — Assessment & Plan Note (Signed)
He is eating very healthy and exercising regularly a1c

## 2017-06-07 NOTE — Assessment & Plan Note (Signed)
BP well controlled Current regimen effective and well tolerated Continue current medications at current doses cmp  

## 2017-08-01 DIAGNOSIS — Z961 Presence of intraocular lens: Secondary | ICD-10-CM | POA: Diagnosis not present

## 2017-08-01 DIAGNOSIS — H02831 Dermatochalasis of right upper eyelid: Secondary | ICD-10-CM | POA: Diagnosis not present

## 2017-08-01 DIAGNOSIS — H02834 Dermatochalasis of left upper eyelid: Secondary | ICD-10-CM | POA: Diagnosis not present

## 2017-09-04 DIAGNOSIS — C44612 Basal cell carcinoma of skin of right upper limb, including shoulder: Secondary | ICD-10-CM | POA: Diagnosis not present

## 2017-09-04 DIAGNOSIS — D1801 Hemangioma of skin and subcutaneous tissue: Secondary | ICD-10-CM | POA: Diagnosis not present

## 2017-09-04 DIAGNOSIS — L821 Other seborrheic keratosis: Secondary | ICD-10-CM | POA: Diagnosis not present

## 2017-09-04 DIAGNOSIS — L812 Freckles: Secondary | ICD-10-CM | POA: Diagnosis not present

## 2017-09-04 DIAGNOSIS — Z85828 Personal history of other malignant neoplasm of skin: Secondary | ICD-10-CM | POA: Diagnosis not present

## 2017-10-30 ENCOUNTER — Encounter: Payer: Self-pay | Admitting: Internal Medicine

## 2017-10-30 ENCOUNTER — Ambulatory Visit: Payer: Medicare Other | Admitting: Internal Medicine

## 2017-10-30 ENCOUNTER — Ambulatory Visit: Payer: Medicare Other

## 2017-10-30 VITALS — BP 146/84 | HR 71 | Temp 97.8°F | Resp 16 | Ht 72.0 in | Wt 164.0 lb

## 2017-10-30 DIAGNOSIS — H6123 Impacted cerumen, bilateral: Secondary | ICD-10-CM

## 2017-10-30 DIAGNOSIS — I10 Essential (primary) hypertension: Secondary | ICD-10-CM

## 2017-10-30 DIAGNOSIS — Z23 Encounter for immunization: Secondary | ICD-10-CM | POA: Diagnosis not present

## 2017-10-30 NOTE — Assessment & Plan Note (Signed)
Was seen in his audiologist office and was found to have bilateral impacted cerumen He does have a feeling of water in his ears intermittently, which is chronic He does state intermittent left ear discomfort Both ears lavaged successfully, which relieved his symptoms

## 2017-10-30 NOTE — Progress Notes (Signed)
Subjective:    Patient ID: Douglas Hammond., male    DOB: 1945-06-21, 72 y.o.   MRN: [id number]  HPI The patient is here for an acute visit.  He saw his audiologist in his ears were both full of excessive cerumen and he was advised to have them out.  He states he does have a feeling of water being in both of his ears intermittently and that is chronic.  He also experiences a mild ache in his left ear at times.  He denies any cold symptoms.  Hypertension: He is taking his medication daily as prescribed.  He does monitor his blood pressure regularly.  His blood pressure is very well controlled and typically goes low, especially after exercise.  He has had as low as high 63K systolic.  He does note that if he eats more carbohydrates blood pressure typically improves.  He is trying to work on increasing his carbohydrates prior to exercise because he is on primarily a keto diet.   Medications and allergies reviewed with patient and updated if appropriate.  Patient Active Problem List   Diagnosis Date Noted  . Colon polyps 01/24/2017  . Lightheadedness 09/19/2016  . 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 Right ankle swelling 07/21/2015  . Right carotid bruit 12/28/2012  . Hyperglycemia 10/16/2012  . 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 Essential hypertension 01/26/2009  . ELECTROCARDIOGRAM, ABNORMAL 01/26/2009  . SKIN CANCER, HX OF 01/26/2009  . ATRIAL SEPTAL DEFECT, HX OF 01/26/2009  . Hyperlipidemia 08/13/2006  . 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 Diverticulosis of large intestine 07/27/2006    Current Outpatient Medications on File Prior to Visit  Medication Sig Dispense Refill  . alclomethasone (ACLOVATE) 0.05 % cream Apply 1 application topically daily.     Marland Kitchen amLODipine (NORVASC) 5 MG tablet Take 1 tablet (5 mg total) by mouth daily. 90 tablet 1  . Probiotic Product (PROBIOTIC FORMULA PO) Take 1 capsule by mouth daily. Align      No current  facility-administered medications on file prior to visit.     Past Medical History:  Diagnosis Date  . Bowel habit changes 06/2016   on Ketogenic diet/ does not go to the BR but every 3 days  . BPH (benign prostatic hyperplasia)   . Diverticulosis of colon 2008  . External hemorrhoids   . Gilbert's syndrome   . Heart murmur   . History of anesthesia complications    Per pt, gets very light-headed past sedation/ fainted 1 time!  . Hyperlipidemia   . Hypertension   . Skin cancer    X2 in [place]; now seeing Dr Georgia Duff    Past Surgical History:  Procedure Laterality Date  . ASD [place] EXTRACTION  July 2015   bilateral eyes; [place] Thyra Breed, [facility]  . COLONOSCOPY  2008   [facility]  . INGUINAL HERNIA REPAIR  2006  . [place]  . open heart   [id number]   72 year old  . TONSILLECTOMY     as child    Social History   Socioeconomic History  . Marital status: Married    Spouse name: Not on file  . Number of children: Not on file  . Years of education: Not on file  . Highest education level: Not on file  Occupational History  . Not on file  Social Needs  .  Financial resource strain: Not on file  . Food insecurity:    Worry: Not on file    Inability: Not on file  . Transportation needs:    Medical: Not on file    Non-medical: Not on file  Tobacco Use  . Smoking status: Never Smoker  . Smokeless tobacco: Never Used  Substance and Sexual Activity  . Alcohol use: Yes    Comment: every 2 weeks wine per pt  . Drug use: No  . Sexual activity: Not on file  Lifestyle  . Physical activity:    Days per week: Not on file    Minutes per session: Not on file  . Stress: Not on file  Relationships  . Social connections:    Talks on phone: Not on file    Gets together: Not on file    Attends religious service: Not on file    Active member of club or organization: Not on file    Attends meetings of clubs or  organizations: Not on file    Relationship status: Not on file  Other Topics Concern  . Not on file  Social History Narrative  . Not on file    Family History  Problem Relation Age of Onset  . Hypertension Mother   . Other Mother        intestine blockage-burst  . Emphysema Father        smoker  . Cancer Neg Hx   . Diabetes Neg Hx   . Heart disease Neg Hx   . Stroke Neg Hx   . Colon cancer Neg Hx     Review of Systems  Constitutional: Negative for chills and fever.  HENT: Positive for ear pain and hearing loss. Negative for congestion, sinus pain and sore throat.   Respiratory: Negative for cough, shortness of breath and wheezing.   Cardiovascular: Negative for chest pain, palpitations and leg swelling.  Neurological: Positive for light-headedness (With low blood pressure).       Objective:   Vitals:   10/30/17 0823  BP: (!) 146/84  Pulse: 71  Resp: 16  Temp: 97.8 F (36.6 C)  SpO2: 95%   BP Readings from Last 3 Encounters:  10/30/17 (!) 146/84  06/07/17 130/76  02/08/17 (!) 164/88   Wt Readings from Last 3 Encounters:  10/30/17 164 lb (74.4 kg)  06/07/17 162 lb (73.5 kg)  02/08/17 165 lb 8 oz (75.1 kg)   Body mass index is 22.24 kg/m.   Physical Exam    GENERAL APPEARANCE: Appears stated age, well appearing, NAD EYES: conjunctiva clear, no icterus LUNGS: Clear to auscultation without wheeze or crackles, unlabored breathing, good air entry bilaterally CARDIOVASCULAR: Normal S1,S2 without murmurs, no edema SKIN: Warm, dry   PRE-PROCEDURE EXAM: Right in left tympanic membranes cannot be visualized due to total occlusion/impaction of the ear canal. PROCEDURE INDICATION: remove wax to visualize ear drum & relieve discomfort CONSENT:  Verbal  PROCEDURE NOTE:   RIGHT EAR:  The CMA used a metal wax curette under direct vision with an otoscope to free the wax bolus from the ear wall and then successfully removed a small bit of wax. The ear was then  irrigated with warm water to remove the remaining wax.  Scant wax remained LEFT EAR:  The CMA used a metal wax curette under direct vision with an otoscope to free the wax bolus from the ear wall and then successfully removed a small bit of wax. The ear was then irrigated with warm  water to remove the remaining wax.  Scant wax remained. POST- PROCEDURE EXAM: TMs successfully visualized and found to have no significant erythema       Assessment & Plan:    See Problem List for Assessment and Plan of chronic medical problems.

## 2017-10-30 NOTE — Assessment & Plan Note (Signed)
Blood pressure elevated here today, but overall is well controlled at home He tends to have hypotension, especially after exercise Not eating enough carbohydrates prior to exercise is typically the cause-he is working on increasing his carbohydrates, especially prior to exercise No change in medication He will continue to monitor at home

## 2017-10-30 NOTE — Patient Instructions (Addendum)
Flu immunization administered today.    Your ears were cleaned out.

## 2017-11-07 DIAGNOSIS — R350 Frequency of micturition: Secondary | ICD-10-CM | POA: Diagnosis not present

## 2018-01-07 ENCOUNTER — Other Ambulatory Visit: Payer: Self-pay | Admitting: Internal Medicine

## 2018-02-01 ENCOUNTER — Encounter: Payer: Medicare Other | Admitting: Internal Medicine

## 2018-02-05 NOTE — Progress Notes (Signed)
Cardiology Office Note   Date:  02/08/2018   ID:  Douglas Pullin., DOB 1945-02-25, MRN [id number]  PCP:  Binnie Rail, MD  Cardiologist:   Jenkins Rouge, MD   No chief complaint on file.     History of Present Illness: Douglas Johnston. is a 73 y.o. 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 Has been on BP meds for years. Tries to follow low sodium diet. Red wine seems to bring BP down. 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 Ineffective and started on norvasc.  He is active playing racquet ball 6 hours / week   Retired from [employer] from [place]  Married no children  Has been on [employer]  Echo reviewed 07/19/16 EF 60-65% mild LVH Trivial MR/AR moderate LAE RV mildly dilated estimated PA 30 mmHg  Bubble study negative right to left shunt  Myovue 07/13/16 no ischemia or infarct EF estimated 50% normal by echo see above   BP seems to be a bit too high He is worried that it will get too low after exercise  No chest pains   Past Medical History:  Diagnosis Date  . Bowel habit changes 06/2016   on Ketogenic diet/ does not go to the BR but every 3 days  . BPH (benign prostatic hyperplasia)   . Diverticulosis of colon 2008  . External hemorrhoids   . Gilbert's syndrome   . Heart murmur   . History of anesthesia complications    Per pt, gets very light-headed past sedation/ fainted 1 time!  . Hyperlipidemia   . Hypertension   . Skin cancer    X2 in [place]; now seeing Dr Georgia Duff    Past Surgical History:  Procedure Laterality Date  . ASD [place] EXTRACTION  July 2015   bilateral eyes; Dr Thyra Breed, [facility]  . COLONOSCOPY  2008   [facility]  . INGUINAL HERNIA REPAIR  2006  . [place]  . open heart   [id number]   73 year old  . TONSILLECTOMY     as child     Current  Outpatient Medications  Medication Sig Dispense Refill  . alclomethasone (ACLOVATE) 0.05 % cream Apply 1 application topically daily.     Marland Kitchen amLODipine (NORVASC) 5 MG tablet TAKE 1 TABLET BY MOUTH DAILY. 90 tablet 0  . Probiotic Product (PROBIOTIC FORMULA PO) Take 1 capsule by mouth daily. Align      No current facility-administered medications for this visit.     Allergies:   Patient has no known allergies.    Social History:  The patient  reports that he has never smoked. He has never used smokeless tobacco. He reports current alcohol use. He reports that he does not use drugs.   Family History:  The patient's family history includes Emphysema in his father; Hypertension in his mother; Other in his mother.    ROS:  Please see the history of present illness.   Otherwise, review of systems are positive for none.   All other systems are reviewed and negative.    PHYSICAL EXAM: VS:  BP (!) 148/88   Pulse (!) 59   Ht 6\' 1"  (1.854 m)   Wt 161 lb (73 kg)  BMI 21.24 kg/m  , BMI Body mass index is 21.24 kg/m. Affect appropriate Healthy:  appears stated age 59: normal Neck supple with no adenopathy JVP normal no bruits no thyromegaly Lungs clear with no wheezing and good diaphragmatic motion Heart:  S1/S2 no murmur, no rub, gallop or click PMI normal Abdomen: benighn, BS positve, no tenderness, no AAA no bruit.  No HSM or HJR Distal pulses intact with no bruits No edema Neuro non-focal Skin warm and dry No muscular weakness   EKG:  02/08/18 SR rate 59 LAE nonspecific ST changes    Recent Labs: 06/07/2017: ALT 16; BUN 20; Creatinine, Ser 0.83; Potassium 4.3; Sodium 139    Lipid Panel    Component Value Date/Time   CHOL 174 06/07/2017 1000   CHOL 186 01/07/2014 1137   TRIG 129.0 06/07/2017 1000   TRIG 64 01/07/2014 1137   HDL 57.10 06/07/2017 1000   HDL 56 01/07/2014 1137   CHOLHDL 3 06/07/2017 1000   VLDL 25.8 06/07/2017 1000   LDLCALC 91 06/07/2017 1000    LDLCALC 117 (H) 01/07/2014 1137      Wt Readings from Last 3 Encounters:  02/08/18 161 lb (73 kg)  02/08/18 161 lb (73 kg)  02/08/18 161 lb 12.8 oz (73.4 kg)      Other studies Reviewed: Additional studies/ records that were reviewed today include: Notes Dr Quay Burow labs and ECG .    ASSESSMENT AND PLAN:  1. HTN  Discussed increasing Norvasc to 10 mg /day he will call us if he wishes To do this  2. ASD Repair intact by echo 07/07/16 negative bubble study  3. Varicosities:  F/u Dr Oneida Alar no phlebitis  4. Bifasicular Block: stable yearly ECG no high grade heart block   Jenkins Rouge

## 2018-02-07 ENCOUNTER — Telehealth: Payer: Self-pay | Admitting: *Deleted

## 2018-02-07 NOTE — Progress Notes (Addendum)
Subjective:   Douglas Johnston. is a 73 y.o. male who presents for Medicare Annual/Subsequent preventive examination.  Review of Systems:  No ROS.  Medicare Wellness Visit. Additional risk factors are reflected in the social history. Cardiac Risk Factors include: advanced age (>18men, >47 women);male gender;hypertension Sleep patterns: feels rested on waking, gets up 1-2 times nightly to void and sleeps 7 hours nightly.    Home Safety/Smoke Alarms: Feels safe in home. Smoke alarms in place.  Living environment; residence and Firearm Safety: 1-story house/ trailer. Lives with wife, no needs for DME, good support system Seat Belt Safety/Bike Helmet: Wears seat belt.    Objective:    Vitals: BP (!) 150/90   Pulse (!) 58   Ht 6' (1.829 m)   Wt 161 lb (73 kg)   SpO2 98%   BMI 21.84 kg/m   Body mass index is 21.84 kg/m.  Advanced Directives 02/08/2018 09/09/2015  Does Patient Have a Medical Advance Directive? Yes Yes  Type of Paramedic of [place];Living will Living will;Healthcare Power of Attorney  Does patient want to make changes to medical advance directive? - No - Patient declined  Copy of [place] in Chart? No - copy requested No - copy requested    Tobacco Social History   Tobacco Use  Smoking Status Never Smoker  Smokeless Tobacco Never Used     Counseling given: Not Answered  Past Medical History:  Diagnosis Date  . Bowel habit changes 06/2016   on Ketogenic diet/ does not go to the BR but every 3 days  . BPH (benign prostatic hyperplasia)   . Diverticulosis of colon 2008  . External hemorrhoids   . Gilbert's syndrome   . Heart murmur   . History of anesthesia complications    Per pt, gets very light-headed past sedation/ fainted 1 time!  . Hyperlipidemia   . Hypertension   . Skin cancer    X2 in [place]; now seeing Dr Georgia Duff   Past Surgical History:  Procedure Laterality Date  . ASD [place] EXTRACTION  July 2015   bilateral eyes; [place] Thyra Breed, [facility]  . COLONOSCOPY  2008   [facility]  . INGUINAL HERNIA REPAIR  2006  . [place]  . open heart   321   73 year old  . TONSILLECTOMY     as child   Family History  Problem Relation Age of Onset  . Hypertension Mother   . Other Mother        intestine blockage-burst  . Emphysema Father        smoker  . Cancer Neg Hx   . Diabetes Neg Hx   . Heart disease Neg Hx   . Stroke Neg Hx   . Colon cancer Neg Hx    Social History   Socioeconomic History  . Marital status: Married    Spouse name: Not on file  . Number of children: Not on file  . Years of education: Not on file  . Highest education level: Not on file  Occupational History  . Occupation: retired  Scientific laboratory technician  . Financial resource strain: Not hard at all  . Food insecurity:    Worry: Never true    Inability: Never true  . Transportation needs:    Medical: No    Non-medical: No  Tobacco Use  . Smoking status:  Never Smoker  . Smokeless tobacco: Never Used  Substance and Sexual Activity  . Alcohol use: Yes    Comment: every 2 weeks Douglas Johnston per pt  . Drug use: No  . Sexual activity: Yes  Lifestyle  . Physical activity:    Days per week: 5 days    Minutes per session: 70 min  . Stress: Not at all  Relationships  . Social connections:    Talks on phone: More than three times a week    Gets together: More than three times a week    Attends religious service: 1 to 4 times per year    Active member of club or organization: Yes    Attends meetings of clubs or organizations: More than 4 times per year    Relationship status: Married  Other Topics Concern  . Not on file  Social History Narrative  . Not on file    Outpatient Encounter Medications as of 02/08/2018  Medication Sig  . alclomethasone (ACLOVATE) 0.05 % cream Apply 1 application topically daily.   Marland Kitchen amLODipine (NORVASC) 5 MG  tablet TAKE 1 TABLET BY MOUTH DAILY.  . Probiotic Product (PROBIOTIC FORMULA PO) Take 1 capsule by mouth daily. Align    No facility-administered encounter medications on file as of 02/08/2018.     Activities of Daily Living In your present state of health, do you have any difficulty performing the following activities: 02/08/2018  Hearing? N  Vision? N  Difficulty concentrating or making decisions? N  Walking or climbing stairs? N  Dressing or bathing? N  Doing errands, shopping? N  Preparing Food and eating ? N  Using the Toilet? N  In the past six months, have you accidently leaked urine? N  Do you have problems with loss of bowel control? N  Managing your Medications? N  Managing your Finances? N  Housekeeping or managing your Housekeeping? N  Some recent data might be hidden    Patient Care Team: Binnie Rail, MD as PCP - General (Internal Medicine)   Assessment:   This is a routine wellness examination for Douglas Johnston. Physical assessment deferred to PCP.  Exercise Activities and Dietary recommendations Current Exercise Habits: Structured exercise class;Home exercise routine, Type of exercise: walking;strength training/weights(plays Racket ball several times weekly), Time (Minutes): 60, Frequency (Times/Week): 5, Weekly Exercise (Minutes/Week): 300, Intensity: Moderate  Diet (meal preparation, eat out, water intake, caffeinated beverages, dairy products, fruits and vegetables): in general, a "healthy" diet  , well balanced. eats a variety of fruits and vegetables daily, limits salt, fat/cholesterol, sugar,carbohydrates,caffeine, drinks 6-8 glasses of water daily.   Goals    . Patient Stated     Continue to exercise and eat healthy.        Fall Risk Fall Risk  02/08/2018 10/30/2017 10/10/2016 10/01/2015 12/20/2012  Falls in the past year? 0 No No No No   Depression Screen PHQ 2/9 Scores 02/08/2018 02/08/2018 10/30/2017 10/10/2016  PHQ - 2 Score 0 0 0 0  PHQ- 9 Score - - 0 -     Cognitive Function       Ad8 score reviewed for issues:  Issues making decisions: no  Less interest in hobbies / activities: no  Repeats questions, stories (family complaining): no  Trouble using ordinary gadgets (microwave, computer, phone):no  Forgets the month or year: no  Mismanaging finances: no  Remembering appts: no  Daily problems with thinking and/or memory: no Ad8 score is= 0  Immunization History  Administered Date(s) Administered  .  Influenza, High Dose Seasonal PF 10/16/2012, 10/01/2015, 10/10/2016, 10/30/2017  . Influenza,inj,Quad PF,6+ Mos 11/28/2013  . Influenza-Unspecified 11/02/2014  . Pneumococcal Conjugate-13 12/25/2014  . Pneumococcal Polysaccharide-23 12/08/2011  . Td 02/14/2010  . Zoster 01/07/2014   Screening Tests Health Maintenance  Topic Date Due  . COLONOSCOPY  01/19/2020  . TETANUS/TDAP  02/15/2020  . INFLUENZA VACCINE  Completed  . Hepatitis C Screening  Completed  . PNA vac Low Risk Adult  Completed      Plan:     Reviewed health maintenance screenings with patient today and relevant education, vaccines, and/or referrals were provided.   Continue doing brain stimulating activities (puzzles, reading, adult coloring books, staying active) to keep memory sharp.   Continue to eat heart healthy diet (full of fruits, vegetables, whole grains, lean protein, water--limit salt, fat, and sugar intake) and increase physical activity as tolerated.  I have personally reviewed and noted the following in the patient's chart:   . Medical and social history . Use of alcohol, tobacco or illicit drugs  . Current medications and supplements . Functional ability and status . Nutritional status . Physical activity . Advanced directives . List of other physicians . Vitals . Screenings to include cognitive, depression, and falls . Referrals and appointments  In addition, I have reviewed and discussed with patient certain preventive  protocols, quality metrics, and best practice recommendations. A written personalized care plan for preventive services as well as general preventive health recommendations were provided to patient.     Michiel Cowboy, RN  02/08/2018   Medical screening examination/treatment/procedure(s) were performed by non-physician practitioner and as supervising physician I was immediately available for consultation/collaboration. I agree with above. Binnie Rail, MD

## 2018-02-07 NOTE — Patient Instructions (Addendum)
Tests ordered today. Your results will be released to MyChart (or called to you) after review, usually within 72hours after test completion. If any changes need to be made, you will be notified at that same time.  All other Health Maintenance issues reviewed.   All recommended immunizations and age-appropriate screenings are up-to-date or discussed.  No immunizations administered today.   Medications reviewed and updated.  Changes include :   none    Please followup in one year    Health Maintenance, Male A healthy lifestyle and preventive care is important for your health and wellness. Ask your health care provider about what schedule of regular examinations is right for you. What should I know about weight and diet? Eat a Healthy Diet  Eat plenty of vegetables, fruits, whole grains, low-fat dairy products, and lean protein.  Do not eat a lot of foods high in solid fats, added sugars, or salt.  Maintain a Healthy Weight Regular exercise can help you achieve or maintain a healthy weight. You should:  Do at least 150 minutes of exercise each week. The exercise should increase your heart rate and make you sweat (moderate-intensity exercise).  Do strength-training exercises at least twice a week. Watch Your Levels of Cholesterol and Blood Lipids  Have your blood tested for lipids and cholesterol every 5 years starting at 75 years of age. If you are at high risk for heart disease, you should start having your blood tested when you are 75 years old. You may need to have your cholesterol levels checked more often if: ? Your lipid or cholesterol levels are high. ? You are older than 75 years of age. ? You are at high risk for heart disease. What should I know about cancer screening? Many types of cancers can be detected early and may often be prevented. Lung Cancer  You should be screened every year for lung cancer if: ? You are a current smoker who has smoked for at least 30  years. ? You are a former smoker who has quit within the past 15 years.  Talk to your health care provider about your screening options, when you should start screening, and how often you should be screened. Colorectal Cancer  Routine colorectal cancer screening usually begins at 75 years of age and should be repeated every 5-10 years until you are 75 years old. You may need to be screened more often if early forms of precancerous polyps or small growths are found. Your health care provider may recommend screening at an earlier age if you have risk factors for colon cancer.  Your health care provider may recommend using home test kits to check for hidden blood in the stool.  A small camera at the end of a tube can be used to examine your colon (sigmoidoscopy or colonoscopy). This checks for the earliest forms of colorectal cancer. Prostate and Testicular Cancer  Depending on your age and overall health, your health care provider may do certain tests to screen for prostate and testicular cancer.  Talk to your health care provider about any symptoms or concerns you have about testicular or prostate cancer. Skin Cancer  Check your skin from head to toe regularly.  Tell your health care provider about any new moles or changes in moles, especially if: ? There is a change in a mole's size, shape, or color. ? You have a mole that is larger than a pencil eraser.  Always use sunscreen. Apply sunscreen liberally and repeat throughout the   day.  Protect yourself by wearing long sleeves, pants, a wide-brimmed hat, and sunglasses when outside. What should I know about heart disease, diabetes, and high blood pressure?  If you are 18-39 years of age, have your blood pressure checked every 3-5 years. If you are 40 years of age or older, have your blood pressure checked every year. You should have your blood pressure measured twice-once when you are at a hospital or clinic, and once when you are not at a  hospital or clinic. Record the average of the two measurements. To check your blood pressure when you are not at a hospital or clinic, you can use: ? An automated blood pressure machine at a pharmacy. ? A home blood pressure monitor.  Talk to your health care provider about your target blood pressure.  If you are between 45-79 years old, ask your health care provider if you should take aspirin to prevent heart disease.  Have regular diabetes screenings by checking your fasting blood sugar level. ? If you are at a normal weight and have a low risk for diabetes, have this test once every three years after the age of 45. ? If you are overweight and have a high risk for diabetes, consider being tested at a younger age or more often.  A one-time screening for abdominal aortic aneurysm (AAA) by ultrasound is recommended for men aged 65-75 years who are current or former smokers. What should I know about preventing infection? Hepatitis B If you have a higher risk for hepatitis B, you should be screened for this virus. Talk with your health care provider to find out if you are at risk for hepatitis B infection. Hepatitis C Blood testing is recommended for:  Everyone born from 1945 through 1965.  Anyone with known risk factors for hepatitis C. Sexually Transmitted Diseases (STDs)  You should be screened each year for STDs including gonorrhea and chlamydia if: ? You are sexually active and are younger than 75 years of age. ? You are older than 75 years of age and your health care provider tells you that you are at risk for this type of infection. ? Your sexual activity has changed since you were last screened and you are at an increased risk for chlamydia or gonorrhea. Ask your health care provider if you are at risk.  Talk with your health care provider about whether you are at high risk of being infected with HIV. Your health care provider may recommend a prescription medicine to help prevent  HIV infection. What else can I do?  Schedule regular health, dental, and eye exams.  Stay current with your vaccines (immunizations).  Do not use any tobacco products, such as cigarettes, chewing tobacco, and e-cigarettes. If you need help quitting, ask your health care provider.  Limit alcohol intake to no more than 2 drinks per day. One drink equals 12 ounces of beer, 5 ounces of wine, or 1 ounces of hard liquor.  Do not use street drugs.  Do not share needles.  Ask your health care provider for help if you need support or information about quitting drugs.  Tell your health care provider if you often feel depressed.  Tell your health care provider if you have ever been abused or do not feel safe at home. This information is not intended to replace advice given to you by your health care provider. Make sure you discuss any questions you have with your health care provider. Document Released: 06/24/2007 Document Revised:   08/25/2015 Document Reviewed: 09/29/2014 Elsevier Interactive Patient Education  2019 Elsevier Inc.  

## 2018-02-07 NOTE — Progress Notes (Signed)
Subjective:    Patient ID: Douglas Johnston., male    DOB: 1945-05-17, 73 y.o.   MRN: [id number]  HPI He is here for a physical exam.   BP at home is usually 140/80's.  When he exercises his BP drops and a couple of hours after exercise it is 115/65.    He feels well and has no concerns.    Medications and allergies reviewed with patient and updated if appropriate.  Patient Active Problem List   Diagnosis Date Noted  . Colon polyps 01/24/2017  . Lightheadedness 09/19/2016  . 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 Right ankle swelling 07/21/2015  . Right carotid bruit 12/28/2012  . Hyperglycemia 10/16/2012  . 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 Essential hypertension 01/26/2009  . ELECTROCARDIOGRAM, ABNORMAL 01/26/2009  . SKIN CANCER, HX OF 01/26/2009  . ATRIAL SEPTAL DEFECT, HX OF 01/26/2009  . Hyperlipidemia 08/13/2006  . 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 Diverticulosis of large intestine 07/27/2006    Current Outpatient Medications on File Prior to Visit  Medication Sig Dispense Refill  . alclomethasone (ACLOVATE) 0.05 % cream Apply 1 application topically daily.     Marland Kitchen amLODipine (NORVASC) 5 MG tablet TAKE 1 TABLET BY MOUTH DAILY. 90 tablet 0  . Probiotic Product (PROBIOTIC FORMULA PO) Take 1 capsule by mouth daily. Align      No current facility-administered medications on file prior to visit.     Past Medical History:  Diagnosis Date  . Bowel habit changes 06/2016   on Ketogenic diet/ does not go to the BR but every 3 days  . BPH (benign prostatic hyperplasia)   . Diverticulosis of colon 2008  . External hemorrhoids   . Gilbert's syndrome   . Heart murmur   . History of anesthesia complications    Per pt, gets very light-headed past sedation/ fainted 1 time!  . Hyperlipidemia   . Hypertension   . Skin cancer    X2 in [place]; now seeing Dr Georgia Duff    Past Surgical History:  Procedure Laterality Date  . ASD [place] EXTRACTION  July 2015   bilateral eyes; Dr Thyra Breed, [facility]  . COLONOSCOPY  2008   [facility]  . INGUINAL HERNIA REPAIR  2006  . [place]  . open heart   4943   73 year old  . TONSILLECTOMY     as child    Social History   Socioeconomic History  . Marital status: Married    Spouse name: Not on file  . Number of children: Not on file  . Years of education: Not on file  . Highest education level: Not on file  Occupational History  . Not on file  Social Needs  . Financial resource strain: Not on file  . Food insecurity:    Worry: Not on file    Inability: Not on file  . Transportation needs:    Medical: Not on file    Non-medical: Not on file  Tobacco Use  . Smoking status: Never Smoker  . Smokeless tobacco: Never Used  Substance and Sexual Activity  . Alcohol use: Yes    Comment: every 2 weeks wine per pt  . Drug use: No  . Sexual activity: Not on file  Lifestyle  . Physical activity:    Days per week: Not on file    Minutes  per session: Not on file  . Stress: Not on file  Relationships  . Social connections:    Talks on phone: Not on file    Gets together: Not on file    Attends religious service: Not on file    Active member of club or organization: Not on file    Attends meetings of clubs or organizations: Not on file    Relationship status: Not on file  Other Topics Concern  . Not on file  Social History Narrative  . Not on file    Family History  Problem Relation Age of Onset  . Hypertension Mother   . Other Mother        intestine blockage-burst  . Emphysema Father        smoker  . Cancer Neg Hx   . Diabetes Neg Hx   . Heart disease Neg Hx   . Stroke Neg Hx   . Colon cancer Neg Hx     Review of Systems  Constitutional: Negative for chills and fever.  Eyes: Negative for visual disturbance.  Respiratory: Positive for cough (dry, occ - related to dry air). Negative for  shortness of breath and wheezing.   Cardiovascular: Negative for chest pain, palpitations and leg swelling.  Gastrointestinal: Negative for abdominal pain, blood in stool, constipation, diarrhea and nausea.       No gerd  Genitourinary: Negative for dysuria and hematuria.  Musculoskeletal: Negative for arthralgias and back pain.  Skin: Negative for color change and rash.  Neurological: Positive for numbness (5th toes bl). Negative for dizziness, light-headedness and headaches.  Psychiatric/Behavioral: Negative for dysphoric mood. The patient is not nervous/anxious.        Objective:   Vitals:   02/08/18 0744  BP: (!) 150/90  Pulse: (!) 58  Resp: 16  Temp: 97.7 F (36.5 C)  SpO2: 98%   Filed Weights   02/08/18 0744  Weight: 161 lb 12.8 oz (73.4 kg)   Body mass index is 21.94 kg/m.  Wt Readings from Last 3 Encounters:  02/08/18 161 lb 12.8 oz (73.4 kg)  10/30/17 164 lb (74.4 kg)  06/07/17 162 lb (73.5 kg)     Physical Exam Constitutional: He appears well-developed and well-nourished. No distress.  HENT:  Head: Normocephalic and atraumatic.  Right Ear: External ear normal.  Left Ear: External ear normal.  Mouth/Throat: Oropharynx is clear and moist.  Normal ear canals and TM b/l  Eyes: Conjunctivae and EOM are normal.  Neck: Neck supple. No tracheal deviation present. No thyromegaly present. No carotid bruit  Cardiovascular: Normal rate, regular rhythm, normal heart sounds and intact distal pulses.   No murmur heard. Pulmonary/Chest: Effort normal and breath sounds normal. No respiratory distress. He has no wheezes. He has no rales.  Abdominal: Soft. He exhibits no distension. There is no tenderness.  Genitourinary: deferred  Musculoskeletal: He exhibits no edema.  Lymphadenopathy:   He has no cervical adenopathy.  Skin: Skin is warm and dry. He is not diaphoretic.  Psychiatric: He has a normal mood and affect. His behavior is normal.         Assessment &  Plan:   Physical exam: Screening blood work   ordered Immunizations   discussed shingles vaccine, others up-to-date Colonoscopy   up-to-date Eye exams    up-to-date EKG   06/2016 Exercise   regular Weight    normal BMI Skin    no concerns - sees derm Q 6 months Substance abuse   none  See Problem List for Assessment and Plan of chronic medical problems.  FU in one year

## 2018-02-07 NOTE — Telephone Encounter (Signed)
Called patient and scheduled an AWV on 02/08/18.

## 2018-02-08 ENCOUNTER — Encounter: Payer: Self-pay | Admitting: Internal Medicine

## 2018-02-08 ENCOUNTER — Ambulatory Visit (INDEPENDENT_AMBULATORY_CARE_PROVIDER_SITE_OTHER): Payer: Medicare Other | Admitting: *Deleted

## 2018-02-08 ENCOUNTER — Other Ambulatory Visit (INDEPENDENT_AMBULATORY_CARE_PROVIDER_SITE_OTHER): Payer: Medicare Other

## 2018-02-08 ENCOUNTER — Ambulatory Visit (INDEPENDENT_AMBULATORY_CARE_PROVIDER_SITE_OTHER): Payer: Medicare Other | Admitting: Internal Medicine

## 2018-02-08 ENCOUNTER — Ambulatory Visit: Payer: Medicare Other | Admitting: Cardiovascular Disease

## 2018-02-08 VITALS — BP 150/90 | HR 58 | Ht 72.0 in | Wt 161.0 lb

## 2018-02-08 VITALS — BP 148/88 | HR 59 | Ht 73.0 in | Wt 161.0 lb

## 2018-02-08 VITALS — BP 150/90 | HR 58 | Temp 97.7°F | Resp 16 | Ht 72.0 in | Wt 161.8 lb

## 2018-02-08 DIAGNOSIS — E785 Hyperlipidemia, unspecified: Secondary | ICD-10-CM | POA: Diagnosis not present

## 2018-02-08 DIAGNOSIS — E349 Endocrine disorder, unspecified: Secondary | ICD-10-CM

## 2018-02-08 DIAGNOSIS — Z0000 Encounter for general adult medical examination without abnormal findings: Secondary | ICD-10-CM

## 2018-02-08 DIAGNOSIS — Z85828 Personal history of other malignant neoplasm of skin: Secondary | ICD-10-CM | POA: Diagnosis not present

## 2018-02-08 DIAGNOSIS — I10 Essential (primary) hypertension: Secondary | ICD-10-CM

## 2018-02-08 LAB — COMPREHENSIVE METABOLIC PANEL
ALT: 15 U/L (ref 0–53)
AST: 18 U/L (ref 0–37)
Albumin: 4.5 g/dL (ref 3.5–5.2)
Alkaline Phosphatase: 58 U/L (ref 39–117)
BUN: 23 mg/dL (ref 6–23)
CO2: 28 mEq/L (ref 19–32)
Calcium: 9.7 mg/dL (ref 8.4–10.5)
Chloride: 102 mEq/L (ref 96–112)
Creatinine, Ser: 0.92 mg/dL (ref 0.40–1.50)
GFR: 80.4 mL/min (ref >60.00)
Glucose, Bld: 90 mg/dL (ref 70–99)
Potassium: 4.2 mEq/L (ref 3.5–5.1)
Sodium: 138 mEq/L (ref 135–145)
Total Bilirubin: 1.1 mg/dL (ref 0.2–1.2)
Total Protein: 7.5 g/dL (ref 6.0–8.3)

## 2018-02-08 LAB — CBC WITH DIFFERENTIAL/PLATELET
Basophils Absolute: 0 10*3/uL (ref 0.0–0.1)
Basophils Relative: 0.4 % (ref 0.0–3.0)
Eosinophils Absolute: 0.1 10*3/uL (ref 0.0–0.7)
Eosinophils Relative: 1 % (ref 0.0–5.0)
HCT: 41.4 % (ref 39.0–52.0)
Hemoglobin: 14 g/dL (ref 13.0–17.0)
Lymphocytes Relative: 28.8 % (ref 12.0–46.0)
Lymphs Abs: 1.8 10*3/uL (ref 0.7–4.0)
MCHC: 34 g/dL (ref 30.0–36.0)
MCV: 94.3 fl (ref 78.0–100.0)
Monocytes Absolute: 0.5 10*3/uL (ref 0.1–1.0)
Monocytes Relative: 8.1 % (ref 3.0–12.0)
Neutro Abs: 3.9 10*3/uL (ref 1.4–7.7)
Neutrophils Relative %: 61.7 % (ref 43.0–77.0)
Platelets: 231 10*3/uL (ref 150.0–400.0)
RBC: 4.39 Mil/uL (ref 4.22–5.81)
RDW: 13.3 % (ref 11.5–15.5)
WBC: 6.4 10*3/uL (ref 4.0–10.5)

## 2018-02-08 LAB — LIPID PANEL
Cholesterol: 204 mg/dL — ABNORMAL HIGH (ref 0–200)
HDL: 56.3 mg/dL (ref >39.00)
LDL Cholesterol: 133 mg/dL — ABNORMAL HIGH (ref 0–99)
NonHDL: 148.1
Total CHOL/HDL Ratio: 4
Triglycerides: 74 mg/dL (ref 0.0–149.0)
VLDL: 14.8 mg/dL (ref 0.0–40.0)

## 2018-02-08 LAB — TSH: TSH: 6.01 u[IU]/mL — ABNORMAL HIGH (ref 0.35–4.50)

## 2018-02-08 NOTE — Assessment & Plan Note (Signed)
Was on testosterone replacement in past Will check testosterone level

## 2018-02-08 NOTE — Assessment & Plan Note (Signed)
Check lipid panel, tsh , cmp Diet controlled Regular exercise and healthy diet encouraged

## 2018-02-08 NOTE — Assessment & Plan Note (Signed)
BP well controlled at home, slightly elevated here Current regimen effective and well tolerated Continue current medications at current doses Cmp

## 2018-02-08 NOTE — Patient Instructions (Signed)

## 2018-02-08 NOTE — Assessment & Plan Note (Signed)
Sees derm q 6 months

## 2018-02-08 NOTE — Patient Instructions (Addendum)
Continue doing brain stimulating activities (puzzles, reading, adult coloring books, staying active) to keep memory sharp.   Continue to eat heart healthy diet (full of fruits, vegetables, whole grains, lean protein, water--limit salt, fat, and sugar intake) and increase physical activity as tolerated.   Douglas Johnston , Thank you for taking time to come for your Medicare Wellness Visit. I appreciate your ongoing commitment to your health goals. Please review the following plan we discussed and let me know if I can assist you in the future.   These are the goals we discussed: Goals    . Patient Stated     Continue to exercise and eat healthy.        This is a list of the screening recommended for you and due dates:  Health Maintenance  Topic Date Due  . Colon Cancer Screening  01/19/2020  . Tetanus Vaccine  02/15/2020  . Flu Shot  Completed  .  Hepatitis C: One time screening is recommended by Center for Disease Control  (CDC) for  adults born from [birth year] through 1965.   Completed  . Pneumonia vaccines  Completed    Preventive Care 35 Years and Older, Male Preventive care refers to lifestyle choices and visits with your health care provider that can promote health and wellness. What does preventive care include?   A yearly physical exam. This is also called an annual well check.  Dental exams once or twice a year.  Routine eye exams. Ask your health care provider how often you should have your eyes checked.  Personal lifestyle choices, including: ? Daily care of your teeth and gums. ? Regular physical activity. ? Eating a healthy diet. ? Avoiding tobacco and drug use. ? Limiting alcohol use. ? Practicing safe sex. ? Taking low doses of aspirin every day. ? Taking vitamin and mineral supplements as recommended by your health care provider. What happens during an annual well check? The services and screenings done by your health care provider during your annual well check  will depend on your age, overall health, lifestyle risk factors, and family history of disease. Counseling Your health care provider may ask you questions about your:  Alcohol use.  Tobacco use.  Drug use.  Emotional well-being.  Home and relationship well-being.  Sexual activity.  Eating habits.  History of falls.  Memory and ability to understand (cognition).  Work and work Statistician. Screening You may have the following tests or measurements:  Height, weight, and BMI.  Blood pressure.  Lipid and cholesterol levels. These may be checked every 5 years, or more frequently if you are over 33 years old.  Skin check.  Lung cancer screening. You may have this screening every year starting at age 53 if you have a 30-pack-year history of smoking and currently smoke or have quit within the past 15 years.  Colorectal cancer screening. All adults should have this screening starting at age 8 and continuing until age 61. You will have tests every 1-10 years, depending on your results and the type of screening test. People at increased risk should start screening at an earlier age. Screening tests may include: ? Guaiac-based fecal occult blood testing. ? Fecal immunochemical test (FIT). ? Stool DNA test. ? Virtual colonoscopy. ? Sigmoidoscopy. During this test, a flexible tube with a tiny camera (sigmoidoscope) is used to examine your rectum and lower colon. The sigmoidoscope is inserted through your anus into your rectum and lower colon. ? Colonoscopy. During this test, a long, thin,  flexible tube with a tiny camera (colonoscope) is used to examine your entire colon and rectum.  Prostate cancer screening. Recommendations will vary depending on your family history and other risks.  Hepatitis C blood test.  Hepatitis B blood test.  Sexually transmitted disease (STD) testing.  Diabetes screening. This is done by checking your blood sugar (glucose) after you have not eaten for  a while (fasting). You may have this done every 1-3 years.  Abdominal aortic aneurysm (AAA) screening. You may need this if you are a current or former smoker.  Osteoporosis. You may be screened starting at age 33 if you are at high risk. Talk with your health care provider about your test results, treatment options, and if necessary, the need for more tests. Vaccines Your health care provider may recommend certain vaccines, such as:  Influenza vaccine. This is recommended every year.  Tetanus, diphtheria, and acellular pertussis (Tdap, Td) vaccine. You may need a Td booster every 10 years.  Varicella vaccine. You may need this if you have not been vaccinated.  Zoster vaccine. You may need this after age 31.  Measles, mumps, and rubella (MMR) vaccine. You may need at least one dose of MMR if you were born in 1957 or later. You may also need a second dose.  Pneumococcal 13-valent conjugate (PCV13) vaccine. One dose is recommended after age 28.  Pneumococcal polysaccharide (PPSV23) vaccine. One dose is recommended after age 25.  Meningococcal vaccine. You may need this if you have certain conditions.  Hepatitis A vaccine. You may need this if you have certain conditions or if you travel or work in places where you may be exposed to hepatitis A.  Hepatitis B vaccine. You may need this if you have certain conditions or if you travel or work in places where you may be exposed to hepatitis B.  Haemophilus influenzae type b (Hib) vaccine. You may need this if you have certain risk factors. Talk to your health care provider about which screenings and vaccines you need and how often you need them. This information is not intended to replace advice given to you by your health care provider. Make sure you discuss any questions you have with your health care provider. Document Released: 01/22/2015 Document Revised: 02/15/2017 Document Reviewed: 10/27/2014 Elsevier Interactive Patient Education   2019 [employer].

## 2018-02-11 LAB — TESTOSTERONE, FREE & TOTAL
Free Testosterone: 56.6 pg/mL (ref 30.0–135.0)
Testosterone, Total, LC-MS-MS: 590 ng/dL (ref 250–1100)

## 2018-02-13 ENCOUNTER — Other Ambulatory Visit: Payer: Self-pay | Admitting: Internal Medicine

## 2018-02-13 DIAGNOSIS — R7989 Other specified abnormal findings of blood chemistry: Secondary | ICD-10-CM | POA: Insufficient documentation

## 2018-02-13 DIAGNOSIS — E7849 Other hyperlipidemia: Secondary | ICD-10-CM

## 2018-03-12 DIAGNOSIS — L821 Other seborrheic keratosis: Secondary | ICD-10-CM | POA: Diagnosis not present

## 2018-03-12 DIAGNOSIS — L820 Inflamed seborrheic keratosis: Secondary | ICD-10-CM | POA: Diagnosis not present

## 2018-03-12 DIAGNOSIS — Z85828 Personal history of other malignant neoplasm of skin: Secondary | ICD-10-CM | POA: Diagnosis not present

## 2018-03-12 DIAGNOSIS — L570 Actinic keratosis: Secondary | ICD-10-CM | POA: Diagnosis not present

## 2018-03-12 DIAGNOSIS — L812 Freckles: Secondary | ICD-10-CM | POA: Diagnosis not present

## 2018-03-23 ENCOUNTER — Other Ambulatory Visit: Payer: Self-pay | Admitting: Internal Medicine

## 2018-04-09 ENCOUNTER — Encounter: Payer: Self-pay | Admitting: Internal Medicine

## 2018-07-27 ENCOUNTER — Other Ambulatory Visit: Payer: Self-pay | Admitting: Internal Medicine

## 2018-08-14 DIAGNOSIS — Z961 Presence of intraocular lens: Secondary | ICD-10-CM | POA: Diagnosis not present

## 2018-09-18 DIAGNOSIS — D1801 Hemangioma of skin and subcutaneous tissue: Secondary | ICD-10-CM | POA: Diagnosis not present

## 2018-09-18 DIAGNOSIS — L812 Freckles: Secondary | ICD-10-CM | POA: Diagnosis not present

## 2018-09-18 DIAGNOSIS — Z85828 Personal history of other malignant neoplasm of skin: Secondary | ICD-10-CM | POA: Diagnosis not present

## 2018-09-18 DIAGNOSIS — D225 Melanocytic nevi of trunk: Secondary | ICD-10-CM | POA: Diagnosis not present

## 2018-09-18 DIAGNOSIS — L570 Actinic keratosis: Secondary | ICD-10-CM | POA: Diagnosis not present

## 2018-09-18 DIAGNOSIS — L821 Other seborrheic keratosis: Secondary | ICD-10-CM | POA: Diagnosis not present

## 2018-09-25 ENCOUNTER — Other Ambulatory Visit: Payer: Self-pay

## 2018-09-25 MED ORDER — AMLODIPINE BESYLATE 5 MG PO TABS: ORAL_TABLET | ORAL | 1 refills | Status: DC

## 2018-09-26 ENCOUNTER — Other Ambulatory Visit: Payer: Self-pay | Admitting: Internal Medicine

## 2018-09-26 MED ORDER — AMLODIPINE BESYLATE 5 MG PO TABS: ORAL_TABLET | ORAL | 1 refills | Status: DC

## 2018-10-10 ENCOUNTER — Other Ambulatory Visit: Payer: Self-pay

## 2018-10-10 ENCOUNTER — Ambulatory Visit (INDEPENDENT_AMBULATORY_CARE_PROVIDER_SITE_OTHER): Payer: Medicare Other

## 2018-10-10 DIAGNOSIS — Z23 Encounter for immunization: Secondary | ICD-10-CM | POA: Diagnosis not present

## 2018-11-09 ENCOUNTER — Ambulatory Visit: Payer: Medicare Other | Admitting: Podiatry

## 2018-11-09 ENCOUNTER — Other Ambulatory Visit: Payer: Self-pay

## 2018-11-09 ENCOUNTER — Encounter: Payer: Self-pay | Admitting: Podiatry

## 2018-11-09 ENCOUNTER — Encounter: Payer: Self-pay | Admitting: Internal Medicine

## 2018-11-09 ENCOUNTER — Other Ambulatory Visit: Payer: Self-pay | Admitting: *Deleted

## 2018-11-09 ENCOUNTER — Ambulatory Visit (INDEPENDENT_AMBULATORY_CARE_PROVIDER_SITE_OTHER): Payer: Medicare Other

## 2018-11-09 DIAGNOSIS — M79674 Pain in right toe(s): Secondary | ICD-10-CM | POA: Diagnosis not present

## 2018-11-09 DIAGNOSIS — M7989 Other specified soft tissue disorders: Secondary | ICD-10-CM | POA: Diagnosis not present

## 2018-11-09 DIAGNOSIS — M7751 Other enthesopathy of right foot: Secondary | ICD-10-CM | POA: Diagnosis not present

## 2018-11-09 DIAGNOSIS — M958 Other specified acquired deformities of musculoskeletal system: Secondary | ICD-10-CM | POA: Diagnosis not present

## 2018-11-09 DIAGNOSIS — M19079 Primary osteoarthritis, unspecified ankle and foot: Secondary | ICD-10-CM

## 2018-11-09 NOTE — Progress Notes (Signed)
  Subjective:  Patient ID: Douglas Johnston., male    DOB: [birth year],  MRN: [id number]  Chief Complaint  Patient presents with  . Foot Problem    i have some pain on the left big toenail and hurts to bend it    73 y.o. male presents with the above complaint. States the toe hurts to bend, worst at toe off. Avid racquetball player, not played since COVID pandemic however.   Review of Systems: Negative except as noted in the HPI. Denies N/V/F/Ch.  Past Medical History:  Diagnosis Date  . Bowel habit changes 06/2016   on Ketogenic diet/ does not go to the BR but every 3 days  . BPH (benign prostatic hyperplasia)   . Diverticulosis of colon 2008  . External hemorrhoids   . Gilbert's syndrome   . Heart murmur   . History of anesthesia complications    Per pt, gets very light-headed past sedation/ fainted 1 time!  . Hyperlipidemia   . Hypertension   . Skin cancer    X2 in [place]; now seeing Dr Georgia Duff    Current Outpatient Medications:  .  alclomethasone (ACLOVATE) 0.05 % cream, Apply 1 application topically daily. , Disp: , Rfl:  .  amLODipine (NORVASC) 5 MG tablet, Take 1 tablet (5 mg total) in the morning and 1/2 tablet (2.5 mg total) in the evening., Disp: 135 tablet, Rfl: 1 .  Probiotic Product (PROBIOTIC FORMULA PO), Take 1 capsule by mouth daily. Align , Disp: , Rfl:  .  triamcinolone cream (KENALOG) 0.1 %, , Disp: , Rfl:   Social History   Tobacco Use  Smoking Status Never Smoker  Smokeless Tobacco Never Used    No Known Allergies Objective:  There were no vitals filed for this visit. There is no height or weight on file to calculate BMI. Constitutional Well developed. Well nourished.  Vascular Dorsalis pedis pulses palpable bilaterally. Posterior tibial pulses palpable bilaterally. Capillary refill normal to all digits.  No cyanosis or clubbing noted. Pedal hair growth normal.  Neurologic Normal speech. Oriented to person, place, and time.  Epicritic sensation to light touch grossly present bilaterally.  Dermatologic Nails well groomed and normal in appearance. No open wounds. No skin lesions.  Orthopedic: POP R 1st MPJ and dorsal 1st met head. No pain on ROM.   Radiographs: Taken and reviewed. Slight 1st MPJ spurring. Subchondral sclerosis, possible OCD Assessment:   1. Capsulitis of metatarsophalangeal (MTP) joint of right foot   2. Pain and swelling of toe of right foot   3. Arthritis of big toe   4. Osteochondral defect    Plan:  Patient was evaluated and treated and all questions answered.  Capsulitis Right 1st MPJ -XR as above -Injection as below -F/u in 3 weeks for recheck  Procedure: Joint Injection Location: Right 1st MPJ joint Skin Prep: Alcohol. Injectate: 0.5 cc 1% lidocaine plain, 0.5 cc dexamethasone phosphate. Disposition: Patient tolerated procedure well. Injection site dressed with a band-aid.    Return in about 1 month (around 12/09/2018) for Capsulitis.

## 2018-11-15 ENCOUNTER — Telehealth: Payer: Self-pay

## 2018-11-15 DIAGNOSIS — M25512 Pain in left shoulder: Secondary | ICD-10-CM | POA: Diagnosis not present

## 2018-11-15 DIAGNOSIS — M542 Cervicalgia: Secondary | ICD-10-CM | POA: Diagnosis not present

## 2018-11-15 DIAGNOSIS — R251 Tremor, unspecified: Secondary | ICD-10-CM

## 2018-11-15 NOTE — Telephone Encounter (Signed)
Referral ordered-I believe this was for his arm shaking

## 2018-11-15 NOTE — Telephone Encounter (Signed)
Copied from [place] [id number]. Topic: Referral - Request for Referral >> Nov 15, 2018 10:29 AM Yvette Rack wrote: Has patient seen PCP for this complaint? yes  *If NO, is insurance requiring patient see PCP for this issue before PCP can refer them? Referral for which specialty: Neurology Preferred provider/office: no specific provider Reason for referral: Pt wife stated pt saw sports medicine doctor and it was suggested to get referral to Neurologist

## 2018-11-18 NOTE — Telephone Encounter (Signed)
The doctors pt is requesting are neurosurgeons at [facility] -   Neurosurgery instead of Neurology referral??  Please advise  Also, its Dr. Christella Noa not Rometta Emery

## 2018-11-18 NOTE — Telephone Encounter (Signed)
Needs to see a neurologist, not a neurosurgeon.  Okay to refer to the doctor requested.

## 2018-11-18 NOTE — Telephone Encounter (Signed)
Left vm for pt to callback 

## 2018-11-18 NOTE — Telephone Encounter (Signed)
PTs wife called and is requesting to have the referral sent to Dr. Rometta Emery with neurology or Dr. Kristeen Miss. Please advise.

## 2018-12-12 ENCOUNTER — Other Ambulatory Visit: Payer: Self-pay

## 2018-12-12 ENCOUNTER — Ambulatory Visit: Payer: Medicare Other | Admitting: Podiatry

## 2018-12-12 DIAGNOSIS — M7751 Other enthesopathy of right foot: Secondary | ICD-10-CM

## 2019-01-12 NOTE — Progress Notes (Signed)
  Subjective:  Patient ID: Douglas Hammond., male    DOB: Jun 04, 1945,  MRN: [id number]  Chief Complaint  Patient presents with  . Foot Pain    pt is here for a capsulitis f/u of the right foot, pt states that he is doing a lot better since the last time he was here, pt also states that the injection he recieved last time has helped     74 y.o. male presents with the above complaint. Hx as above.  Review of Systems: Negative except as noted in the HPI. Denies N/V/F/Ch.  Past Medical History:  Diagnosis Date  . Bowel habit changes 06/2016   on Ketogenic diet/ does not go to the BR but every 3 days  . BPH (benign prostatic hyperplasia)   . Diverticulosis of colon 2008  . External hemorrhoids   . Gilbert's syndrome   . Heart murmur   . History of anesthesia complications    Per pt, gets very light-headed past sedation/ fainted 1 time!  . Hyperlipidemia   . Hypertension   . Skin cancer    X2 in [place]; now seeing Dr Georgia Duff    Current Outpatient Medications:  .  alclomethasone (ACLOVATE) 0.05 % cream, Apply 1 application topically daily. , Disp: , Rfl:  .  amLODipine (NORVASC) 5 MG tablet, Take 1 tablet (5 mg total) in the morning and 1/2 tablet (2.5 mg total) in the evening., Disp: 135 tablet, Rfl: 1 .  Probiotic Product (PROBIOTIC FORMULA PO), Take 1 capsule by mouth daily. Align , Disp: , Rfl:  .  triamcinolone cream (KENALOG) 0.1 %, , Disp: , Rfl:   Social History   Tobacco Use  Smoking Status Never Smoker  Smokeless Tobacco Never Used    No Known Allergies Objective:  There were no vitals filed for this visit. There is no height or weight on file to calculate BMI. Constitutional Well developed. Well nourished.  Vascular Dorsalis pedis pulses palpable bilaterally. Posterior tibial pulses palpable bilaterally. Capillary refill normal to all digits.  No cyanosis or clubbing noted. Pedal hair growth normal.  Neurologic Normal speech. Oriented to  person, place, and time. Epicritic sensation to light touch grossly present bilaterally.  Dermatologic Nails well groomed and normal in appearance. No open wounds. No skin lesions.  Orthopedic: POP R 1st MPJ and dorsal 1st met head. No pain on ROM.    Assessment:   No diagnosis found. Plan:  Patient was evaluated and treated and all questions answered.  Capsulitis Right 1st MPJ -Repeat injection as below  Procedure: Joint Injection Location: Right 1st MPJ joint Skin Prep: Alcohol. Injectate: 0.5 cc 1% lidocaine plain, 0.5 cc dexamethasone phosphate. Disposition: Patient tolerated procedure well. Injection site dressed with a band-aid.      Return in about 6 weeks (around 01/23/2019) for Capsulitis.

## 2019-01-21 ENCOUNTER — Other Ambulatory Visit: Payer: Self-pay

## 2019-01-21 ENCOUNTER — Ambulatory Visit: Payer: Medicare Other | Admitting: Neurology

## 2019-01-21 ENCOUNTER — Encounter: Payer: Self-pay | Admitting: Neurology

## 2019-01-21 VITALS — BP 122/72 | HR 64 | Temp 97.6°F | Ht 73.0 in | Wt 159.5 lb

## 2019-01-21 DIAGNOSIS — R251 Tremor, unspecified: Secondary | ICD-10-CM | POA: Diagnosis not present

## 2019-01-21 DIAGNOSIS — R7989 Other specified abnormal findings of blood chemistry: Secondary | ICD-10-CM

## 2019-01-21 NOTE — Progress Notes (Signed)
[facility]  PATIENT: Douglas Johnston. DOB: 04-03-1945  REFERRING DOCTOR OR PCP: Billey Gosling, MD SOURCE: Patient, notes from PCP, lab reports.  _________________________________   HISTORICAL  CHIEF COMPLAINT:  Chief Complaint  Patient presents with  . New Patient (Initial Visit)    RM 12, alone. Internal referral from Billey Gosling, MD (PCP) for tremors in left arm that started October 2020. 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 Had xray shoulder, recommeding shoulder replacement. He feels like a nerve is being pinched.     HISTORY OF PRESENT ILLNESS:  I had the pleasure seeing your patient, Douglas Johnston, at [facility] for neurologic consultation regarding his tremors.  He is a 74 year old man who has had tremors since October.   The tremor is only in the left arm.    The tremors are positional and he notes the intensity is worse when he is resting and the arm relaxed.    He denies difficulty holding a utensil (he is right handed though).    He has not notes it is worse if emotional though he is worse when he tenses the muscles of the left arm.    He notes no tremor on the right side.   His wife notes a little quiver in the lips.    Gait is fine and he can walk up to 6 miles.   He has no change in his stride.   No change in voice or swallowing.   He has had a dry cough at night some times.     He has been on a keto diet x 3 years but does have occasional more variety.     He notes more trouble with the left shoulder (he has known DJD).    He has HTN.  He does not have DM.    January 2020 his TSH was elevated at 6.01 and it was normal 2 years ago.   He has no FH of tremor.     REVIEW OF SYSTEMS: Constitutional: No fevers, chills, sweats, or change in appetite Eyes: No visual changes, double vision, eye pain Ear, nose and throat: No hearing loss, ear pain, nasal congestion, sore throat Cardiovascular: No chest pain,  palpitations Respiratory: No shortness of breath at rest or with exertion.   No wheezes GastrointestinaI: No nausea, vomiting, diarrhea, abdominal pain, fecal incontinence Genitourinary: No dysuria, urinary retention or frequency.  No nocturia. Musculoskeletal: No neck pain, back pain Integumentary: No rash, pruritus, skin lesions Neurological: as above Psychiatric: No depression at this time.  No anxiety Endocrine: No palpitations, diaphoresis, change in appetite, change in weigh or increased thirst Hematologic/Lymphatic: No anemia, purpura, petechiae. Allergic/Immunologic: No itchy/runny eyes, nasal congestion, recent allergic reactions, rashes  ALLERGIES: No Known Allergies  HOME MEDICATIONS:  Current Outpatient Medications:  .  alclomethasone (ACLOVATE) 0.05 % cream, Apply 1 application topically daily. , Disp: , Rfl:  .  amLODipine (NORVASC) 5 MG tablet, Take 1 tablet (5 mg total) in the morning and 1/2 tablet (2.5 mg total) in the evening., Disp: 135 tablet, Rfl: 1 .  Probiotic Product (PROBIOTIC FORMULA PO), Take 1 capsule by mouth daily. Align , Disp: , Rfl:  .  triamcinolone cream (KENALOG) 0.1 %, , Disp: , Rfl:   PAST MEDICAL HISTORY: Past Medical History:  Diagnosis Date  . Bowel habit changes 06/2016   on Ketogenic diet/ does not go to the BR but every 3 days  . BPH (benign prostatic hyperplasia)   .  Diverticulosis of colon 2008  . External hemorrhoids   . Gilbert's syndrome   . Heart murmur   . History of anesthesia complications    Per pt, gets very light-headed past sedation/ fainted 1 time!  . Hyperlipidemia   . Hypertension   . Skin cancer    X2 in [place]; now seeing Dr Georgia Duff    PAST SURGICAL HISTORY: Past Surgical History:  Procedure Laterality Date  . ASD [place] EXTRACTION  July 2015   bilateral eyes; Dr Thyra Breed, [facility]  . COLONOSCOPY  2008   [facility]  . INGUINAL HERNIA REPAIR  2006  . [place]  . open heart   6424   74 year old  . TONSILLECTOMY     as child    FAMILY HISTORY: Family History  Problem Relation Age of Onset  . Hypertension Mother   . Other Mother        intestine blockage-burst  . Emphysema Father        smoker  . Cancer Neg Hx   . Diabetes Neg Hx   . Heart disease Neg Hx   . Stroke Neg Hx   . Colon cancer Neg Hx     SOCIAL HISTORY:  Social History   Socioeconomic History  . Marital status: Married    Spouse name: Douglas Johnston  . Number of children: 0  . Years of education: BA  . Highest education level: Not on file  Occupational History  . Occupation: Retired  Tobacco Use  . Smoking status: Never Smoker  . Smokeless tobacco: Never Used  Substance and Sexual Activity  . Alcohol use: Yes    Comment: every 2 weeks wine per pt  . Drug use: No  . Sexual activity: Yes  Other Topics Concern  . Not on file  Social History Narrative      Caffeine use: coffee daily   Right handed    Social Determinants of Health   Financial Resource Strain: Low Risk   . Difficulty of Paying Living Expenses: Not hard at all  Food Insecurity: No Food Insecurity  . Worried About Charity fundraiser in the Last Year: Never true  . Ran Out of Food in the Last Year: Never true  Transportation Needs: No Transportation Needs  . Lack of Transportation (Medical): No  . Lack of Transportation (Non-Medical): No  Physical Activity: Sufficiently Active  . Days of Exercise per Week: 5 days  . Minutes of Exercise per Session: 70 min  Stress: No Stress Concern Present  . Feeling of Stress : Not at all  Social Connections: Not Isolated  . Frequency of Communication with Friends and Family: More than three times a week  . Frequency of Social Gatherings with Friends and Family: More than three times a week  . Attends Religious Services: 1 to 4 times per year  . Active Member of Clubs or Organizations: Yes  . Attends Theatre manager Meetings: More than 4 times per year  . Marital Status: Married  Human resources officer Violence: Not At Risk  . Fear of Current or Ex-Partner: No  . Emotionally Abused: No  . Physically Abused: No  . Sexually Abused: No     PHYSICAL EXAM  Vitals:   01/21/19 1044  BP: 122/72  Pulse: 64  Temp: 97.6 F (36.4 C)  Weight: 159 lb 8 oz (72.3 kg)  Height:  6\' 1"  (1.854 m)    Body mass index is 21.04 kg/m.   General: The patient is well-developed and well-nourished and in no acute distress  HEENT:  Head is [place]/AT.  Sclera are anicteric.    Neck: No carotid bruits are noted.  The neck is nontender.  Cardiovascular: The heart has a regular rate and rhythm with a normal S1 and S2. There were no murmurs, gallops or rubs.    Skin: Extremities are without rash or  edema.  Musculoskeletal:  Back is nontender  Neurologic Exam  Mental status: The patient is alert and oriented x 3 at the time of the examination. The patient has apparent normal recent and remote memory, with an apparently normal attention span and concentration ability.   Speech is normal.  Cranial nerves: Extraocular movements are full. Pupils are equal, round, and reactive to light and accomodation.  Visual fields are full.  Facial symmetry is present. There is good facial sensation to soft touch bilaterally.Facial strength is normal.  Trapezius and sternocleidomastoid strength is normal. No dysarthria is noted.    No obvious hearing deficits are noted.  Motor: He has a 6 to 7 Hz tremor in the left hand.  It was worse when he wrote.  Muscle bulk is normal.   Tone is normal. Strength is  5 / 5 in all 4 extremities.   Sensory: Sensory testing is intact to pinprick, soft touch and vibration sensation in all 4 extremities.  Coordination: Cerebellar testing reveals good finger-nose-finger and heel-to-shin bilaterally.  Gait and station: Station is normal.   Gait is normal. Tandem gait is normal for age.  He did have  mild retropulsion.   Romberg is negative.   Reflexes: Deep tendon reflexes are symmetric and normal bilaterally.   Plantar responses are flexor.    DIAGNOSTIC DATA (LABS, IMAGING, TESTING) - I reviewed patient records, labs, notes, testing and imaging myself where available.  Lab Results  Component Value Date   WBC 6.4 02/08/2018   HGB 14.0 02/08/2018   HCT 41.4 02/08/2018   MCV 94.3 02/08/2018   PLT 231.0 02/08/2018      Component Value Date/Time   NA 138 02/08/2018 0842   K 4.2 02/08/2018 0842   CL 102 02/08/2018 0842   CO2 28 02/08/2018 0842   GLUCOSE 90 02/08/2018 0842   BUN 23 02/08/2018 0842   CREATININE 0.92 02/08/2018 0842   CALCIUM 9.7 02/08/2018 0842   PROT 7.5 02/08/2018 0842   ALBUMIN 4.5 02/08/2018 0842   AST 18 02/08/2018 0842   ALT 15 02/08/2018 0842   ALKPHOS 58 02/08/2018 0842   BILITOT 1.1 02/08/2018 0842   GFRNONAA 79.69 01/26/2009 0000   GFRAA 110 08/13/2006 0000   Lab Results  Component Value Date   CHOL 204 (H) 02/08/2018   HDL 56.30 02/08/2018   LDLCALC 133 (H) 02/08/2018   TRIG 74.0 02/08/2018   CHOLHDL 4 02/08/2018   Lab Results  Component Value Date   HGBA1C 5.5 06/07/2017   No results found for: [id number] Lab Results  Component Value Date   TSH 6.01 (H) 02/08/2018       ASSESSMENT AND PLAN   Tremor - Plan: Thyroid Panel With TSH  Elevated TSH - Plan: Thyroid Panel With TSH  In summary, Douglas Johnston is a 74 year old man who has developed a tremor in the left hand over the last few months.  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  However, characteristics were not  perfect as the tremor did not always worsen with intention or with motion.  Additionally, he had mild retropulsion when this was evaluated on examination.  Therefore, I cannot rule out an early Parkinson's disease.  He does not have bradykinesia or cogwheel rigidity.  These were present, Parkinson's would be a more likely diagnosis.  I  think it would be reasonable to have him try a beta-blocker to determine if the tremor improved.  He is on a calcium channel blocker and I have asked him to discuss this further when he meets with you in a couple weeks.  If possible, could he be switched to a beta-blocker instead of the calcium channel blocker for his hypertension?   If not, we could consider Mysoline at a low dose of 50 mg twice a day and titrate up from there.  I would like to see him back in about 4 months to reevaluate.  Early on, distinguishing between essential tremor and Parkinson's is difficult but over time further characteristics usually develop  Thank you for asking me to see Douglas Johnston.  Please let me know if I can be of further assistance with him or other patients in the future.    Gianni Mihalik A. Felecia Shelling, MD, [facility] [id number], [id number] AM Certified in Neurology, Clinical Neurophysiology, Sleep Medicine and Neuroimaging  [facility] [address] [place], [place] [address] [phone]

## 2019-01-22 ENCOUNTER — Telehealth: Payer: Self-pay | Admitting: *Deleted

## 2019-01-22 LAB — THYROID PANEL WITH TSH
Free Thyroxine Index: 1.9 (ref 1.2–4.9)
T3 Uptake Ratio: 30 % (ref 24–39)
T4, Total: 6.3 ug/dL (ref 4.5–12.0)
TSH: 4.09 u[IU]/mL (ref 0.450–4.500)

## 2019-01-22 NOTE — Telephone Encounter (Signed)
-----   Message from Britt Bottom, MD sent at 01/22/2019  3:44 PM EST ----- Please let the patient know that the lab work is fine.

## 2019-01-23 ENCOUNTER — Ambulatory Visit: Payer: Medicare Other | Admitting: Podiatry

## 2019-01-23 ENCOUNTER — Other Ambulatory Visit: Payer: Self-pay

## 2019-01-23 DIAGNOSIS — M7989 Other specified soft tissue disorders: Secondary | ICD-10-CM

## 2019-01-23 DIAGNOSIS — M79674 Pain in right toe(s): Secondary | ICD-10-CM

## 2019-01-23 DIAGNOSIS — M7751 Other enthesopathy of right foot: Secondary | ICD-10-CM | POA: Diagnosis not present

## 2019-01-23 NOTE — Progress Notes (Signed)
  Subjective:  Patient ID: Douglas Hammond., male    DOB: Jul 25, 1945,  MRN: [id number]  Chief Complaint  Patient presents with  . Foot Pain    pt is here for capsulitis f/u of the right foot, pt states that the right foot is doing alot better since the last time he was here, the injection he recieved last time, has helped tremendously, pt is still feeling pain in the right foot    74 y.o. male presents with the above complaint. History confirmed with patient.   Objective:  Physical Exam: warm, good capillary refill, no trophic changes or ulcerative lesions, normal DP and PT pulses and normal sensory exam. Right Foot: POP right 1st MPJ   Assessment:   1. Capsulitis of metatarsophalangeal (MTP) joint of right foot   2. Pain and swelling of toe of right foot      Plan:  Patient was evaluated and treated and all questions answered.  Capsulitis -Injection delivered to the painful joint; 3rd injection  Procedure: Joint Injection Location: Right 1st MPJ joint Skin Prep: Alcohol. Injectate: 0.5 cc 1% lidocaine plain, 0.5 cc dexamethasone phosphate. Disposition: Patient tolerated procedure well. Injection site dressed with a band-aid.  Return in about 6 weeks (around 03/06/2019) for Capsulitis, right.

## 2019-02-04 ENCOUNTER — Ambulatory Visit: Payer: Medicare Other

## 2019-02-13 ENCOUNTER — Ambulatory Visit: Payer: Medicare Other | Attending: Internal Medicine

## 2019-02-13 DIAGNOSIS — Z23 Encounter for immunization: Secondary | ICD-10-CM

## 2019-02-13 NOTE — Progress Notes (Signed)
Subjective:    Patient ID: Douglas Johnston., male    DOB: 07/26/1945, 74 y.o.   MRN: [id number]  HPI He is here for a physical exam.   Tremor in left hand/arm occurs daily.  He notices mostly when he is sitting sitting and sometimes with activity.  He does have arthritis in the left shoulder, but the orthopedic did not feel it was related to that.  He did see neurology and is unclear if this is a benign tremor or early Parkinson's.  He did advise considering changing the amlodipine to a beta-blocker to see if that would help.  He has no other concerns.  Medications and allergies reviewed with patient and updated if appropriate.  Patient Active Problem List   Diagnosis Date Noted  . Elevated TSH 02/13/2018  . Colon polyps 01/24/2017  . Lightheadedness 09/19/2016  . 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 Right ankle swelling 07/21/2015  . PCO (posterior capsular opacification), right 08/06/2013  . Pseudophakia of both eyes 07/18/2013  . Dermatochalasis of both upper eyelids 05/14/2013  . Right carotid bruit 12/28/2012  . Nuclear cataract 03/28/2012  . 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 Testosterone deficiency 01/26/2009  . Essential hypertension 01/26/2009  . ELECTROCARDIOGRAM, ABNORMAL 01/26/2009  . SKIN CANCER, HX OF 01/26/2009  . ATRIAL SEPTAL DEFECT, HX OF 01/26/2009  . Hyperlipidemia 08/13/2006  . 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 Diverticulosis of large intestine 07/27/2006    Current Outpatient Medications on File Prior to Visit  Medication Sig Dispense Refill  . alclomethasone (ACLOVATE) 0.05 % cream Apply 1 application topically daily.     Marland Kitchen amLODipine (NORVASC) 5 MG tablet Take 1 tablet (5 mg total) in the morning and 1/2 tablet (2.5 mg total) in the evening. 135 tablet 1  . Probiotic Product (PROBIOTIC FORMULA PO) Take 1 capsule by mouth daily. Align     . triamcinolone cream (KENALOG) 0.1 %      No current facility-administered medications on file prior to visit.     Past Medical History:  Diagnosis Date  . Bowel habit changes 06/2016   on Ketogenic diet/ does not go to the BR but every 3 days  . BPH (benign prostatic hyperplasia)   . Diverticulosis of colon 2008  . External hemorrhoids   . Gilbert's syndrome   . Heart murmur   . History of anesthesia complications    Per pt, gets very light-headed past sedation/ fainted 1 time!  . Hyperlipidemia   . Hypertension   . Skin cancer    X2 in [place]; now seeing Dr Georgia Duff    Past Surgical History:  Procedure Laterality Date  . ASD [place] EXTRACTION  July 2015   bilateral eyes; [place] Thyra Breed, [facility]  . COLONOSCOPY  2008   [facility]  . INGUINAL HERNIA REPAIR  2006  . [place]  . open heart   7156   75 year old  . TONSILLECTOMY     as child    Social History   Socioeconomic History  . Marital status: Married    Spouse name: Aiken Aragona  . Number of children: 0  . Years of education: BA  . Highest education level: Not on file  Occupational History  . Occupation: Retired  Tobacco Use  . Smoking status: Never Smoker  . Smokeless tobacco: Never Used  Substance and Sexual Activity  . Alcohol use: Yes  Comment: every 2 weeks wine per pt  . Drug use: No  . Sexual activity: Yes  Other Topics Concern  . Not on file  Social History Narrative      Caffeine use: coffee daily   Right handed    Social Determinants of Health   Financial Resource Strain:   . Difficulty of Paying Living Expenses: Not on file  Food Insecurity:   . Worried About Charity fundraiser in the Last Year: Not on file  . Ran Out of Food in the Last Year: Not on file  Transportation Needs:   . Lack of Transportation (Medical): Not on file  . Lack of Transportation (Non-Medical): Not on file  Physical Activity:   . Days of Exercise per Week: Not on file  . Minutes of Exercise per Session: Not on file  Stress:   . Feeling  of Stress : Not on file  Social Connections:   . Frequency of Communication with Friends and Family: Not on file  . Frequency of Social Gatherings with Friends and Family: Not on file  . Attends Religious Services: Not on file  . Active Member of Clubs or Organizations: Not on file  . Attends Archivist Meetings: Not on file  . Marital Status: Not on file    Family History  Problem Relation Age of Onset  . Hypertension Mother   . Other Mother        intestine blockage-burst  . Emphysema Father        smoker  . Cancer Neg Hx   . Diabetes Neg Hx   . Heart disease Neg Hx   . Stroke Neg Hx   . Colon cancer Neg Hx     Review of Systems  Constitutional: Negative for chills and fever.  Eyes: Negative for visual disturbance.  Respiratory: Negative for cough, shortness of breath and wheezing.   Cardiovascular: Negative for chest pain, palpitations and leg swelling.  Gastrointestinal: Negative for abdominal pain, blood in stool, constipation, diarrhea and nausea.       No gerd  Genitourinary: Negative for difficulty urinating, dysuria and hematuria.  Musculoskeletal: Positive for arthralgias (left shoulder OA).  Skin: Negative for color change and rash.  Neurological: Positive for tremors (left arm). Negative for dizziness, numbness and headaches.  Psychiatric/Behavioral: Negative for dysphoric mood. The patient is not nervous/anxious.        Objective:   Vitals:   02/14/19 0738  BP: 126/80  Pulse: 66  Resp: 16  Temp: 98 F (36.7 C)  SpO2: 96%   Filed Weights   02/14/19 0738  Weight: 158 lb 6.4 oz (71.8 kg)   Body mass index is 20.9 kg/m.  BP Readings from Last 3 Encounters:  02/14/19 126/80  01/21/19 122/72  02/08/18 (!) 148/88    Wt Readings from Last 3 Encounters:  02/14/19 158 lb 6.4 oz (71.8 kg)  01/21/19 159 lb 8 oz (72.3 kg)  02/08/18 161 lb (73 kg)     Physical Exam Constitutional: He appears well-developed and well-nourished. No distress.   HENT:  Head: Normocephalic and atraumatic.  Right Ear: External ear normal.  Left Ear: External ear normal.  Mouth/Throat: Oropharynx is clear and moist.  Normal ear canals and TM b/l  Eyes: Conjunctivae and EOM are normal.  Neck: Neck supple. No tracheal deviation present. No thyromegaly present.  No carotid bruit  Cardiovascular: Normal rate, regular rhythm, normal heart sounds and intact distal pulses.   No murmur heard. Pulmonary/Chest: Effort  normal and breath sounds normal. No respiratory distress. He has no wheezes. He has no rales.  Abdominal: Soft. He exhibits no distension. There is no tenderness.  Genitourinary: deferred  Musculoskeletal: He exhibits no edema.  Lymphadenopathy:   He has no cervical adenopathy. Neurological: Slight resting tremor left hand-very mild and intermittent Skin: Skin is warm and dry. He is not diaphoretic.  Psychiatric: He has a normal mood and affect. His behavior is normal.         Assessment & Plan:   Physical exam: Screening blood work  ordered Immunizations  Up to date Colonoscopy   Up to date  Eye exams   Up to date  Exercise   regular Weight  BMI normal Substance abuse     none Sees Dr Ronnald Ramp Q 6 months   See Problem List for Assessment and Plan of chronic medical problems.    This visit occurred during the SARS-CoV-2 public health emergency.  Safety protocols were in place, including screening questions prior to the visit, additional usage of staff PPE, and extensive cleaning of exam room while observing appropriate contact time as indicated for disinfecting solutions.

## 2019-02-13 NOTE — Patient Instructions (Addendum)
Blood work was ordered.    All other Health Maintenance issues reviewed.   All recommended immunizations and age-appropriate screenings are up-to-date or discussed.  No immunization administered today.   Medications reviewed and updated.  Changes include :   Stop amlodipine.  Start propranolol 40 mg twice daily and monitor your BP.  Try lotrimin cream for your belly button redness.  Your prescription(s) have been submitted to your pharmacy. Please take as directed and contact our office if you believe you are having problem(s) with the medication(s).    Please followup in 1 year    Health Maintenance, Male Adopting a healthy lifestyle and getting preventive care are important in promoting health and wellness. Ask your health care provider about:  The right schedule for you to have regular tests and exams.  Things you can do on your own to prevent diseases and keep yourself healthy. What should I know about diet, weight, and exercise? Eat a healthy diet   Eat a diet that includes plenty of vegetables, fruits, low-fat dairy products, and lean protein.  Do not eat a lot of foods that are high in solid fats, added sugars, or sodium. Maintain a healthy weight Body mass index (BMI) is a measurement that can be used to identify possible weight problems. It estimates body fat based on height and weight. Your health care provider can help determine your BMI and help you achieve or maintain a healthy weight. Get regular exercise Get regular exercise. This is one of the most important things you can do for your health. Most adults should:  Exercise for at least 150 minutes each week. The exercise should increase your heart rate and make you sweat (moderate-intensity exercise).  Do strengthening exercises at least twice a week. This is in addition to the moderate-intensity exercise.  Spend less time sitting. Even light physical activity can be beneficial. Watch cholesterol and blood  lipids Have your blood tested for lipids and cholesterol at 75 years of age, then have this test every 5 years. You may need to have your cholesterol levels checked more often if:  Your lipid or cholesterol levels are high.  You are older than 76 years of age.  You are at high risk for heart disease. What should I know about cancer screening? Many types of cancers can be detected early and may often be prevented. Depending on your health history and family history, you may need to have cancer screening at various ages. This may include screening for:  Colorectal cancer.  Prostate cancer.  Skin cancer.  Lung cancer. What should I know about heart disease, diabetes, and high blood pressure? Blood pressure and heart disease  High blood pressure causes heart disease and increases the risk of stroke. This is more likely to develop in people who have high blood pressure readings, are of African descent, or are overweight.  Talk with your health care provider about your target blood pressure readings.  Have your blood pressure checked: ? Every 3-5 years if you are 30-22 years of age. ? Every year if you are 102 years old or older.  If you are between the ages of 37 and 58 and are a current or former smoker, ask your health care provider if you should have a one-time screening for abdominal aortic aneurysm (AAA). Diabetes Have regular diabetes screenings. This checks your fasting blood sugar level. Have the screening done:  Once every three years after age 104 if you are at a normal weight and  have a low risk for diabetes.  More often and at a younger age if you are overweight or have a high risk for diabetes. What should I know about preventing infection? Hepatitis B If you have a higher risk for hepatitis B, you should be screened for this virus. Talk with your health care provider to find out if you are at risk for hepatitis B infection. Hepatitis C Blood testing is recommended  for:  Everyone born from [birth year] through 1967.  Anyone with known risk factors for hepatitis C. Sexually transmitted infections (STIs)  You should be screened each year for STIs, including gonorrhea and chlamydia, if: ? You are sexually active and are younger than 76 years of age. ? You are older than 76 years of age and your health care provider tells you that you are at risk for this type of infection. ? Your sexual activity has changed since you were last screened, and you are at increased risk for chlamydia or gonorrhea. Ask your health care provider if you are at risk.  Ask your health care provider about whether you are at high risk for HIV. Your health care provider may recommend a prescription medicine to help prevent HIV infection. If you choose to take medicine to prevent HIV, you should first get tested for HIV. You should then be tested every 3 months for as long as you are taking the medicine. Follow these instructions at home: Lifestyle  Do not use any products that contain nicotine or tobacco, such as cigarettes, e-cigarettes, and chewing tobacco. If you need help quitting, ask your health care provider.  Do not use street drugs.  Do not share needles.  Ask your health care provider for help if you need support or information about quitting drugs. Alcohol use  Do not drink alcohol if your health care provider tells you not to drink.  If you drink alcohol: ? Limit how much you have to 0-2 drinks a day. ? Be aware of how much alcohol is in your drink. In the [place] one drink equals one 12 oz bottle of beer (355 mL), one 5 oz glass of wine (148 mL), or one 1 oz glass of hard liquor (44 mL). General instructions  Schedule regular health, dental, and eye exams.  Stay current with your vaccines.  Tell your health care provider if: ? You often feel depressed. ? You have ever been abused or do not feel safe at home. Summary  Adopting a healthy lifestyle and getting  preventive care are important in promoting health and wellness.  Follow your health care provider's instructions about healthy diet, exercising, and getting tested or screened for diseases.  Follow your health care provider's instructions on monitoring your cholesterol and blood pressure. This information is not intended to replace advice given to you by your health care provider. Make sure you discuss any questions you have with your health care provider. Document Revised: 12/19/2017 Document Reviewed: 12/19/2017 Elsevier Patient Education  2020 [employer].

## 2019-02-13 NOTE — Progress Notes (Signed)
   [id number] Vaccination [facility]  Name:  Ennio Woulard.    MRN: [id number] DOB: February 04, 1945  02/13/2019  Mr. Fredell was observed post Covid-19 immunization for 15 minutes without incidence. He was provided with Vaccine Information Sheet and instruction to access the V-Safe system.   Mr. Dieleman was instructed to call 911 with any severe reactions post vaccine: Marland Kitchen Difficulty breathing  . Swelling of your face and throat  . A fast heartbeat  . A bad rash all over your body  . Dizziness and weakness    Immunizations Administered    Name Date Dose VIS Date Route   Pfizer COVID-19 Vaccine 02/13/2019  5:03 PM 0.3 mL 12/20/2018 Intramuscular   Manufacturer: [place]   Lot: [id number]   [place]: [id number]

## 2019-02-14 ENCOUNTER — Other Ambulatory Visit: Payer: Self-pay

## 2019-02-14 ENCOUNTER — Ambulatory Visit (INDEPENDENT_AMBULATORY_CARE_PROVIDER_SITE_OTHER): Payer: Medicare Other | Admitting: Internal Medicine

## 2019-02-14 ENCOUNTER — Encounter: Payer: Self-pay | Admitting: Internal Medicine

## 2019-02-14 VITALS — BP 126/80 | HR 66 | Temp 98.0°F | Resp 16 | Ht 73.0 in | Wt 158.4 lb

## 2019-02-14 DIAGNOSIS — R251 Tremor, unspecified: Secondary | ICD-10-CM

## 2019-02-14 DIAGNOSIS — E7849 Other hyperlipidemia: Secondary | ICD-10-CM

## 2019-02-14 DIAGNOSIS — I10 Essential (primary) hypertension: Secondary | ICD-10-CM | POA: Diagnosis not present

## 2019-02-14 DIAGNOSIS — Z0000 Encounter for general adult medical examination without abnormal findings: Secondary | ICD-10-CM | POA: Diagnosis not present

## 2019-02-14 LAB — COMPREHENSIVE METABOLIC PANEL
ALT: 14 U/L (ref 0–53)
AST: 18 U/L (ref 0–37)
Albumin: 4.3 g/dL (ref 3.5–5.2)
Alkaline Phosphatase: 67 U/L (ref 39–117)
BUN: 18 mg/dL (ref 6–23)
CO2: 29 mEq/L (ref 19–32)
Calcium: 9.2 mg/dL (ref 8.4–10.5)
Chloride: 103 mEq/L (ref 96–112)
Creatinine, Ser: 0.86 mg/dL (ref 0.40–1.50)
GFR: 86.67 mL/min (ref >60.00)
Glucose, Bld: 95 mg/dL (ref 70–99)
Potassium: 4.1 mEq/L (ref 3.5–5.1)
Sodium: 139 mEq/L (ref 135–145)
Total Bilirubin: 1 mg/dL (ref 0.2–1.2)
Total Protein: 7.1 g/dL (ref 6.0–8.3)

## 2019-02-14 LAB — CBC WITH DIFFERENTIAL/PLATELET
Basophils Absolute: 0 10*3/uL (ref 0.0–0.1)
Basophils Relative: 0.3 % (ref 0.0–3.0)
Eosinophils Absolute: 0.1 10*3/uL (ref 0.0–0.7)
Eosinophils Relative: 0.8 % (ref 0.0–5.0)
HCT: 39.3 % (ref 39.0–52.0)
Hemoglobin: 13.3 g/dL (ref 13.0–17.0)
Lymphocytes Relative: 23 % (ref 12.0–46.0)
Lymphs Abs: 1.8 10*3/uL (ref 0.7–4.0)
MCHC: 34 g/dL (ref 30.0–36.0)
MCV: 94.4 fl (ref 78.0–100.0)
Monocytes Absolute: 0.6 10*3/uL (ref 0.1–1.0)
Monocytes Relative: 8.3 % (ref 3.0–12.0)
Neutro Abs: 5.3 10*3/uL (ref 1.4–7.7)
Neutrophils Relative %: 67.6 % (ref 43.0–77.0)
Platelets: 219 10*3/uL (ref 150.0–400.0)
RBC: 4.16 Mil/uL — ABNORMAL LOW (ref 4.22–5.81)
RDW: 13.4 % (ref 11.5–15.5)
WBC: 7.8 10*3/uL (ref 4.0–10.5)

## 2019-02-14 LAB — LIPID PANEL
Cholesterol: 164 mg/dL (ref 0–200)
HDL: 52.8 mg/dL (ref >39.00)
LDL Cholesterol: 100 mg/dL — ABNORMAL HIGH (ref 0–99)
NonHDL: 110.8
Total CHOL/HDL Ratio: 3
Triglycerides: 53 mg/dL (ref 0.0–149.0)
VLDL: 10.6 mg/dL (ref 0.0–40.0)

## 2019-02-14 LAB — VITAMIN B12: Vitamin B-12: 587 pg/mL (ref 211–911)

## 2019-02-14 MED ORDER — PROPRANOLOL HCL 40 MG PO TABS: 40.0000 mg | ORAL_TABLET | Freq: Two times a day (BID) | ORAL | 5 refills | Status: DC

## 2019-02-14 NOTE — Assessment & Plan Note (Addendum)
Chronic Check lipid panel  Continue daily statin Continue exercise and healthy diet

## 2019-02-14 NOTE — Assessment & Plan Note (Signed)
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

## 2019-02-14 NOTE — Assessment & Plan Note (Signed)
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

## 2019-02-16 ENCOUNTER — Encounter: Payer: Self-pay | Admitting: Internal Medicine

## 2019-02-17 NOTE — Progress Notes (Signed)
Cardiology Office Note   Date:  02/21/2019   ID:  Douglas Johnston., DOB 1945/06/18, MRN [id number]  PCP:  Binnie Rail, MD  Cardiologist:   Jenkins Rouge, MD   No chief complaint on file.     History of Present Illness: Douglas Johnston. is a 74 y.o. 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 Has been on BP meds for years. Tries to follow low sodium diet. Red wine seems to bring BP down. 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 Ineffective and started on norvasc.  He is active playing racquet ball 6 hours / week   Retired from [employer] from [place]  Married no children  Has been on [employer]  Echo reviewed 07/19/16 EF 60-65% mild LVH Trivial MR/AR moderate LAE RV mildly dilated estimated PA 30 mmHg  Bubble study negative right to left shunt  Myovue 07/13/16 no ischemia or infarct EF estimated 50% normal by echo see above   Seen by primary 02/14/19 and has developed tremor in left hand/arm Norvasc changed to propanolol   HR in mid 50's He seems to think BP higher at home Has a reasonable Omron cuff Also notes occasional skips   Past Medical History:  Diagnosis Date  . Bowel habit changes 06/2016   on Ketogenic diet/ does not go to the BR but every 3 days  . BPH (benign prostatic hyperplasia)   . Diverticulosis of colon 2008  . External hemorrhoids   . Gilbert's syndrome   . Heart murmur   . History of anesthesia complications    Per pt, gets very light-headed past sedation/ fainted 1 time!  . Hyperlipidemia   . Hypertension   . Skin cancer    X2 in [place]; now seeing Dr Georgia Duff    Past Surgical History:  Procedure Laterality Date  . ASD [place] EXTRACTION  July 2015   bilateral eyes; Dr Thyra Breed, [facility]  . COLONOSCOPY  2008   [facility]  . INGUINAL HERNIA REPAIR  2006  . [place]  . open heart   6215   74 year old  . TONSILLECTOMY     as child     Current Outpatient Medications  Medication Sig Dispense Refill  . alclomethasone (ACLOVATE) 0.05 % cream Apply 1 application topically daily.     . Probiotic Product (PROBIOTIC FORMULA PO) Take 1 capsule by mouth daily. Align     . propranolol (INDERAL) 40 MG tablet Take 1 tablet (40 mg total) by mouth 2 (two) times daily. 60 tablet 5  . triamcinolone cream (KENALOG) 0.1 %      No current facility-administered medications for this visit.    Allergies:   Patient has no known allergies.    Social History:  The patient  reports that he has never smoked. He has never used smokeless tobacco. He reports current alcohol use. He reports that he does not use drugs.   Family History:  The patient's family history includes Emphysema in his father; Hypertension in his mother; Other in his mother.    ROS:  Please see the history of present illness.   Otherwise, review of systems are positive for none.   All other systems are reviewed and negative.  PHYSICAL EXAM: VS:  BP 124/86   Pulse (!) 55   Ht 6\' 1"  (1.854 m)   Wt 163 lb (73.9 kg)   SpO2 98%   BMI 21.51 kg/m  , BMI Body mass index is 21.51 kg/m. Affect appropriate Healthy:  appears stated age 66: normal Neck supple with no adenopathy JVP normal no bruits no thyromegaly Lungs clear with no wheezing and good diaphragmatic motion Heart:  S1/S2 no murmur, no rub, gallop or click PMI normal Abdomen: benighn, BS positve, no tenderness, no AAA no bruit.  No HSM or HJR Distal pulses intact with no bruits No edema Neuro resting tremor in left hand  Skin warm and dry No muscular weakness   EKG:  02/08/18 SR rate 59 LAE nonspecific ST changes 02/21/19 SR rate 55 lateral T  Wave changes    Recent Labs: 01/21/2019: TSH 4.090 02/14/2019: ALT 14; BUN 18; Creatinine, Ser 0.86; Hemoglobin 13.3; Platelets 219.0; Potassium 4.1; Sodium 139    Lipid  Panel    Component Value Date/Time   CHOL 164 02/14/2019 0821   CHOL 186 01/07/2014 1137   TRIG 53.0 02/14/2019 0821   TRIG 64 01/07/2014 1137   HDL 52.80 02/14/2019 0821   HDL 56 01/07/2014 1137   CHOLHDL 3 02/14/2019 0821   VLDL 10.6 02/14/2019 0821   LDLCALC 100 (H) 02/14/2019 0821   LDLCALC 117 (H) 01/07/2014 1137      Wt Readings from Last 3 Encounters:  02/21/19 163 lb (73.9 kg)  02/14/19 158 lb 6.4 oz (71.8 kg)  01/21/19 159 lb 8 oz (72.3 kg)      Other studies Reviewed: Additional studies/ records that were reviewed today include: Notes Dr Quay Burow labs and ECG .    ASSESSMENT AND PLAN:  1. HTN  Norvasc changed to inderal by primary 02/14/19 will have to watch ECG intervals with history bifasicular block ECG ok today will have him f/u in BP clinic and bring his cuff in to correlate and go over home readings in 4 weeks 2. ASD Repair intact by echo 07/07/16 negative bubble study  3. Varicosities:  F/u Dr Oneida Alar no phlebitis  4. Bifasicular Block: stable yearly ECG no high grade heart block  5. Tremor:  See above on beta blocker ? Refer to neuro for testing to r/o Parkinson's   Jenkins Rouge

## 2019-02-21 ENCOUNTER — Encounter: Payer: Self-pay | Admitting: Cardiovascular Disease

## 2019-02-21 ENCOUNTER — Other Ambulatory Visit: Payer: Self-pay

## 2019-02-21 ENCOUNTER — Ambulatory Visit: Payer: Medicare Other | Admitting: Cardiovascular Disease

## 2019-02-21 VITALS — BP 124/86 | HR 55 | Ht 73.0 in | Wt 163.0 lb

## 2019-02-21 DIAGNOSIS — I10 Essential (primary) hypertension: Secondary | ICD-10-CM

## 2019-02-21 NOTE — Patient Instructions (Signed)
Medication Instructions:  *If you need a refill on your cardiac medications before your next appointment, please call your pharmacy*  Lab Work If you have labs (blood work) drawn today and your tests are completely normal, you will receive your results only by: Marland Kitchen MyChart Message (if you have MyChart) OR . A paper copy in the mail If you have any lab test that is abnormal or we need to change your treatment, we will call you to review the results.  Follow-Up: At [facility], you and your health needs are our priority.  As part of our continuing mission to provide you with exceptional heart care, we have created designated Provider Care Teams.  These Care Teams include your primary Cardiologist (physician) and Advanced Practice Providers (APPs -  Physician Assistants and Nurse Practitioners) who all work together to provide you with the care you need, when you need it.  Your next appointment:   1 year(s)  The format for your next appointment:   In Person  Provider:   You may see Dr. Johnsie Cancel or one of the following Advanced Practice Providers on your designated Care Team:    Truitt Merle, NP  Cecilie Kicks, NP  Kathyrn Drown, NP  You have been referred to blood pressure clinic in 4 weeks.

## 2019-02-23 ENCOUNTER — Encounter: Payer: Self-pay | Admitting: Internal Medicine

## 2019-03-06 ENCOUNTER — Other Ambulatory Visit: Payer: Self-pay

## 2019-03-06 ENCOUNTER — Ambulatory Visit: Payer: Medicare Other | Admitting: Podiatry

## 2019-03-06 DIAGNOSIS — M958 Other specified acquired deformities of musculoskeletal system: Secondary | ICD-10-CM

## 2019-03-06 DIAGNOSIS — M7751 Other enthesopathy of right foot: Secondary | ICD-10-CM

## 2019-03-06 DIAGNOSIS — M19079 Primary osteoarthritis, unspecified ankle and foot: Secondary | ICD-10-CM

## 2019-03-06 NOTE — Progress Notes (Signed)
  Subjective:  Patient ID: Douglas Johnston., male    DOB: [birth year],  MRN: [id number]  Chief Complaint  Patient presents with  . Foot Pain    Capsulitis follow up, right. Pt states gradual improvement but still has pain when pressure applied.    74 y.o. male presents with the above complaint. History confirmed with patient.   Objective:  Physical Exam: warm, good capillary refill, no trophic changes or ulcerative lesions, normal DP and PT pulses and normal sensory exam.  Right Foot: pain crepitus ROM R 1st MPJ   Assessment:   1. Capsulitis of metatarsophalangeal (MTP) joint of right foot   2. Arthritis of big toe   3. Osteochondral defect    Plan:  Patient was evaluated and treated and all questions answered.  Capsulitis, Hallux Limitus -XR again reviewed with patient -Hold off repeat injection today -Discussed possible surgical intervention in the future. Procedure and post-op course discussed with patient. -Patient not having significant enough pain to consider surgical intervention at this time.  Return if symptoms worsen or fail to improve.

## 2019-03-11 ENCOUNTER — Ambulatory Visit: Payer: Medicare Other | Attending: Internal Medicine

## 2019-03-11 DIAGNOSIS — Z23 Encounter for immunization: Secondary | ICD-10-CM | POA: Insufficient documentation

## 2019-03-11 NOTE — Progress Notes (Signed)
   [id number] Vaccination Clinic  Name:  Aydenn Campau.    MRN: [id number] DOB: [birth year]  03/11/2019  Mr. Coker was observed post Covid-19 immunization for 15 minutes without incident. He was provided with Vaccine Information Sheet and instruction to access the V-Safe system.   Mr. Dallis was instructed to call 911 with any severe reactions post vaccine: Marland Kitchen Difficulty breathing  . Swelling of face and throat  . A fast heartbeat  . A bad rash all over body  . Dizziness and weakness   Immunizations Administered    Name Date Dose VIS Date Route   Pfizer COVID-19 Vaccine 03/11/2019  8:26 AM 0.3 mL 12/20/2018 Intramuscular   Manufacturer: [place]   Lot: [id number]   [place]: [id number]

## 2019-03-18 DIAGNOSIS — Z85828 Personal history of other malignant neoplasm of skin: Secondary | ICD-10-CM | POA: Diagnosis not present

## 2019-03-18 DIAGNOSIS — D485 Neoplasm of uncertain behavior of skin: Secondary | ICD-10-CM | POA: Diagnosis not present

## 2019-03-18 DIAGNOSIS — L812 Freckles: Secondary | ICD-10-CM | POA: Diagnosis not present

## 2019-03-18 DIAGNOSIS — L821 Other seborrheic keratosis: Secondary | ICD-10-CM | POA: Diagnosis not present

## 2019-03-18 DIAGNOSIS — L820 Inflamed seborrheic keratosis: Secondary | ICD-10-CM | POA: Diagnosis not present

## 2019-03-18 DIAGNOSIS — C44519 Basal cell carcinoma of skin of other part of trunk: Secondary | ICD-10-CM | POA: Diagnosis not present

## 2019-03-19 NOTE — Progress Notes (Signed)
Patient ID: Douglas Johnston.                 DOB: 11/07/1945                      MRN: [id number]     HPI: Douglas Johnston. is a 74 y.o. male referred by Dr. Johnsie Cancel to HTN clinic. 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 His last ECHO in July 2018 showed LVEF 60-65% with mild LVH. Bubble study was also negative for right to left shunt.   Patient was seen by PCP (Dr. Quay Burow) on 02/14/19 where BP was controlled at 126/80. He developed a new tremor in his left arm/hand, so he was referred to neurology and amlodipine was changed to propranolol. On 02/21/19, patient was seen by Dr. Johnsie Cancel where BP remained close to goal at 124/86, so no medication changes were made. Since then, patient has expressed concerns for elevated home BP of 140/87, but his tremors have become milder.  Patient arrives to clinic today for initial visit. His tremors remain mild and not as frequent. He is concerned because his BP is elevated at home and his HR has been lower in the 50s and a few times in the 40s since starting propanolol. He has tried losartan in the past but stopped it because it was not effective. He was on HCTZ for many years but stopped it once he started the keto diet because of fluctuations in his BP. He notices that a drop in HR occurs after he exercises by playing pickle ball and walking 3-5 miles 3 times/week. He uses two different BP cuffs at home, and he states that he measures his BP as soon as he gets up in the morning and does not rest prior to measuring. He brought his BP cuffs to his visit and they seem to run slightly higher than clinic cuff.  Clinic BP cuff: 146/80 Home BP cuff #1: 156/89, HR 54 Home BP cuff #2: 148/90, HR 55  Current HTN meds: propranolol 40mg  BID  Previously tried: Losartan 25mg  (ineffective), amlodipine 5mg  daily (tremor), HCTZ 12.5mg  daily (BP fluctuations with keto diet)  BP goal:  <130/80  Family History: HTN in mother  Social History: Never smoker  Diet: Keto diet (fats: avocado, butter)  Exercise: Walks 3-5 miles for 3 days/week. Plays pickle ball. Used to play racquet ball prior to COVID.  Home BP readings: 128/77, 136/72, 140/88, 151/90, 159/95 - typically ranges 130-150s/80-90s. Average HR 55, one reading of 49  Wt Readings from Last 3 Encounters:  02/21/19 163 lb (73.9 kg)  02/14/19 158 lb 6.4 oz (71.8 kg)  01/21/19 159 lb 8 oz (72.3 kg)   BP Readings from Last 3 Encounters:  02/21/19 124/86  02/14/19 126/80  01/21/19 122/72   Pulse Readings from Last 3 Encounters:  02/21/19 (!) 55  02/14/19 66  01/21/19 64    Renal function: CrCl cannot be calculated (Patient's most recent lab result is older than the maximum 21 days allowed.).  Past Medical History:  Diagnosis Date  . Bowel habit changes 06/2016   on Ketogenic diet/ does not go to the BR but every 3 days  . BPH (benign prostatic hyperplasia)   . Diverticulosis of colon 2008  . External hemorrhoids   . Gilbert's syndrome   . Heart murmur   . History of anesthesia complications  Per pt, gets very light-headed past sedation/ fainted 1 time!  . Hyperlipidemia   . Hypertension   . Skin cancer    X2 in [place]; now seeing Dr Georgia Duff    Current Outpatient Medications on File Prior to Visit  Medication Sig Dispense Refill  . alclomethasone (ACLOVATE) 0.05 % cream Apply 1 application topically daily.     . Probiotic Product (PROBIOTIC FORMULA PO) Take 1 capsule by mouth daily. Align     . propranolol (INDERAL) 40 MG tablet Take 1 tablet (40 mg total) by mouth 2 (two) times daily. 60 tablet 5  . triamcinolone cream (KENALOG) 0.1 %      No current facility-administered medications on file prior to visit.    No Known Allergies   Assessment/Plan:  1. Hypertension - BP is elevated and above goal of < 130/80 mmHg. Will start irbesartan 150mg  daily, which is more effective in  lowering BP compared to losartan. Continue propranolol 40 mg BID for his tremors per neuro. May need to decrease propranolol or change to different agent if HR remains low in the 40s. Discussed proper BP measuring techniques, and patient will start to measure his BP 1 hr after taking his medications and after resting for 5 minutes. Discussed the importance of continuing low-sodium diet and regular exercise. Will follow-up in office on 04/03/19 to check BP and BMET.  Douglas Johnston, PharmD PGY1 Pharmacy Resident  Douglas Johnston, Pharm.D, BCPS, CPP [place]  [id number], [place], [place] [address]  Phone: [phone]; Fax: [phone]

## 2019-03-20 ENCOUNTER — Ambulatory Visit (INDEPENDENT_AMBULATORY_CARE_PROVIDER_SITE_OTHER): Payer: Medicare Other | Admitting: Pharmacist

## 2019-03-20 ENCOUNTER — Other Ambulatory Visit: Payer: Self-pay

## 2019-03-20 VITALS — BP 146/80 | HR 58

## 2019-03-20 DIAGNOSIS — I10 Essential (primary) hypertension: Secondary | ICD-10-CM | POA: Diagnosis not present

## 2019-03-20 MED ORDER — IRBESARTAN 150 MG PO TABS: 150.0000 mg | ORAL_TABLET | Freq: Every day | ORAL | 11 refills | Status: DC

## 2019-03-20 NOTE — Patient Instructions (Addendum)
It was nice to meet you today!  Your blood pressure today is 146/80 which is above your goal of < 130/80.  START taking irbesartan 150mg  daily  Continue taking propranolol 40mg  twice daily. If your HR keeps dropping into the 40s, please let us know.  Continue checking your BP every day and bring your BP readings to your next visit  Please give [place] a call at [phone] if you have any questions

## 2019-04-01 NOTE — Progress Notes (Signed)
Patient ID: Douglas Johnston.                 DOB: 06/05/1945                      MRN: [id number]     HPI: Douglas Johnston. is a 74 y.o. male referred by Dr. Johnsie Cancel to HTN clinic. 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 His last ECHO in July 2018 showed LVEF 60-65% with mild LVH. Bubble study was also negative for right to left shunt.   Patient was seen by PCP (Dr. Quay Burow) on 02/14/19 where BP was controlled at 126/80. He developed a new tremor in his left arm/hand, so he was referred to neurology and amlodipine was changed to propranolol. On 02/21/19, patient was seen by Dr. Johnsie Cancel where BP remained close to goal at 124/86, so no medication changes were made. Since then, patient has expressed concerns for elevated home BP of 140/87, but his tremors have become milder so he was referred to HTN clinic. Patient was last seen in HTN clinic on 03/20/19. His BP was elevated at 146/80 mmHg, so he was started on irbesartan 150mg  daily.   Patient arrives today for follow-up. He brought his home BP readings which have been close to goal with an average BP of 135/78 mmHg and a HR of 56 bpm. He is now measuring his BP 30 minutes after he wakes up before taking his medications. He is very pleased with the improvement in his BP control since starting irbesartan. He has a few HR readings in the 40s but he endorses feeling fine without significant fatigue. He does play pickleball three times weekly and has fatigue in his legs afterwards but does not think this is due to the medication. His tremors are still present but has been lighter and not as frequent as before. 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 He is unable to get a BP reading when he has this numbing sensation, but states that this has been an ongoing  problem that is gradually getting worse.  Current HTN meds: propranolol 40mg  BID, irbesartan 150mg  daily (AM)  Previously tried: Losartan 25mg  (ineffective), amlodipine 5mg  daily (tremor), HCTZ 12.5mg  daily (BP fluctuations with keto diet)  BP goal: <130/80 mmHg  Family History: HTN in mother  Social History: Never smoker  Diet: Keto diet (fats: avocado, butter)  Exercise: Walks 3-5 miles for 3 days/week. Plays pickle ball. Used to play racquet ball prior to COVID.  Home BP readings: Average: 135/78 mmHg, HR 56 130/80, 143/88, 135/73, 129/74, 129/81, 132/71, 130/69, 128/86, 125/79; HR stable in mid-50s with some readings in the 40s (lowest 40, highest 69)  Wt Readings from Last 3 Encounters:  02/21/19 163 lb (73.9 kg)  02/14/19 158 lb 6.4 oz (71.8 kg)  01/21/19 159 lb 8 oz (72.3 kg)   BP Readings from Last 3 Encounters:  03/20/19 (!) 146/80  02/21/19 124/86  02/14/19 126/80   Pulse Readings from Last 3 Encounters:  03/20/19 (!) 58  02/21/19 (!) 55  02/14/19 66    Renal function: CrCl cannot be calculated (Patient's most recent lab result is older than the maximum 21 days allowed.).  Past Medical History:  Diagnosis Date  . Bowel habit changes 06/2016   on  Ketogenic diet/ does not go to the BR but every 3 days  . BPH (benign prostatic hyperplasia)   . Diverticulosis of colon 2008  . External hemorrhoids   . Gilbert's syndrome   . Heart murmur   . History of anesthesia complications    Per pt, gets very light-headed past sedation/ fainted 1 time!  . Hyperlipidemia   . Hypertension   . Skin cancer    X2 in [place]; now seeing Dr Georgia Duff    Current Outpatient Medications on File Prior to Visit  Medication Sig Dispense Refill  . alclomethasone (ACLOVATE) 0.05 % cream Apply 1 application topically daily.     . irbesartan (AVAPRO) 150 MG tablet Take 1 tablet (150 mg total) by mouth daily. 30 tablet 11  . Probiotic Product (PROBIOTIC FORMULA PO) Take 1  capsule by mouth daily. Align     . propranolol (INDERAL) 40 MG tablet Take 1 tablet (40 mg total) by mouth 2 (two) times daily. 60 tablet 5  . triamcinolone cream (KENALOG) 0.1 %      No current facility-administered medications on file prior to visit.    No Known Allergies   Assessment/Plan:  1. Hypertension - BP is improved and at goal of < 130/80 mmHg. Pending BMET results, will continue irbesartan 150mg  daily. Continue propranolol 40 mg BID for his tremors per neuro. May need to decrease propranolol or change to different agent if HR remains low in the 40s. Discussed proper BP measuring techniques, and patient will start to measure his BP 1 hr after taking his medications and after resting for 5 minutes. Discussed the importance of continuing low-sodium diet and regular exercise. Will call pt with BMET results and follow-up in HTN clinic as needed.  Richardine Service, PharmD PGY1 Pharmacy Resident

## 2019-04-03 ENCOUNTER — Ambulatory Visit (INDEPENDENT_AMBULATORY_CARE_PROVIDER_SITE_OTHER): Payer: Medicare Other | Admitting: Pharmacist

## 2019-04-03 ENCOUNTER — Other Ambulatory Visit: Payer: Self-pay

## 2019-04-03 VITALS — BP 120/78 | HR 55

## 2019-04-03 DIAGNOSIS — I10 Essential (primary) hypertension: Secondary | ICD-10-CM | POA: Diagnosis not present

## 2019-04-03 LAB — BASIC METABOLIC PANEL
BUN/Creatinine Ratio: 23 (ref 10–24)
BUN: 20 mg/dL (ref 8–27)
CO2: 24 mmol/L (ref 20–29)
Calcium: 9.1 mg/dL (ref 8.6–10.2)
Chloride: 103 mmol/L (ref 96–106)
Creatinine, Ser: 0.86 mg/dL (ref 0.76–1.27)
GFR calc Af Amer: 98 mL/min/{1.73_m2} (ref >59)
GFR calc non Af Amer: 85 mL/min/{1.73_m2} (ref >59)
Glucose: 88 mg/dL (ref 65–99)
Potassium: 4.8 mmol/L (ref 3.5–5.2)
Sodium: 141 mmol/L (ref 134–144)

## 2019-04-03 NOTE — Patient Instructions (Addendum)
It was nice to see you today   Your blood pressure goal is less than 130/87mmHg   Continue taking your irbesartan 150mg  and propranolol 40mg  BID   If your labs are stable today, we will plan to continue your current medications. We will give you a call when the labs come back so that we can finalize this plan.    Please continue to monitor your blood pressure at home. Please measure your blood pressure 1 hours after taking your medications  Please call us at [phone] if you have any questions

## 2019-04-07 ENCOUNTER — Telehealth: Payer: Self-pay | Admitting: Pharmacist

## 2019-04-07 NOTE — Telephone Encounter (Signed)
Spoke with patient about BMET results from 04/03/19. Scr and electrolytes are stable. Instructed him to continue taking irbesartan 150mg  daily and measuring his BP daily. Patient verbalized understanding.

## 2019-04-24 ENCOUNTER — Encounter: Payer: Self-pay | Admitting: Internal Medicine

## 2019-04-24 NOTE — Telephone Encounter (Signed)
Please advise on mychart message. Pt is aware you are out until Monday.

## 2019-04-27 MED ORDER — PROPRANOLOL HCL 40 MG PO TABS: 40.0000 mg | ORAL_TABLET | Freq: Three times a day (TID) | ORAL | 5 refills | Status: DC

## 2019-05-22 ENCOUNTER — Ambulatory Visit: Payer: Medicare Other | Admitting: Neurology

## 2019-05-22 ENCOUNTER — Encounter: Payer: Self-pay | Admitting: Internal Medicine

## 2019-05-22 ENCOUNTER — Other Ambulatory Visit: Payer: Self-pay

## 2019-05-22 ENCOUNTER — Encounter: Payer: Self-pay | Admitting: Neurology

## 2019-05-22 VITALS — BP 130/78 | HR 53 | Temp 97.1°F | Ht 73.0 in | Wt 163.5 lb

## 2019-05-22 DIAGNOSIS — R5383 Other fatigue: Secondary | ICD-10-CM | POA: Insufficient documentation

## 2019-05-22 DIAGNOSIS — R001 Bradycardia, unspecified: Secondary | ICD-10-CM

## 2019-05-22 DIAGNOSIS — R251 Tremor, unspecified: Secondary | ICD-10-CM

## 2019-05-22 MED ORDER — PRIMIDONE 50 MG PO TABS: ORAL_TABLET | ORAL | 5 refills | Status: DC

## 2019-05-22 MED ORDER — AMLODIPINE BESYLATE 5 MG PO TABS: 7.5000 mg | ORAL_TABLET | Freq: Every day | ORAL | 1 refills | Status: DC

## 2019-05-22 NOTE — Progress Notes (Signed)
[facility]  PATIENT: Douglas Johnston. DOB: 1945/05/22  REFERRING DOCTOR OR PCP: Billey Gosling, MD SOURCE: Patient, notes from PCP, lab reports.  _________________________________   HISTORICAL  CHIEF COMPLAINT:  Chief Complaint  Patient presents with  . Follow-up    RM 12, alone. Last seen 01/21/2019. Here to f/u on tremor, left arm. Taking propranolol but feels it is not working. HR is running low per pt (in the 50's). He is also feeling fatigued.     HISTORY OF PRESENT ILLNESS:  Douglas Johnston  is a 74 year old man who has had tremors since October 2020.     Update 05/22/2019: Due to tremor he was placed on inderal 40 mg po tid.   He does not think it helped and he also felt more fatigued and pulse dropped to the 50s.  He did not have any problem with BP or lightheadness.  We discussed going back on amlodipine which he tolerated better.  Tremor is only in the left hand and occurs at rest.   We discussed that it is uncertain whether he has essential tremor or early Parkinson's disease.  He does not have other parkinsonian features specifically no bradykinesia cogwheel rigidity or significant gait issues (just mild retropulsion).  He remains active and plays pickle ball and walks regularly without difficulty.   From 01/21/2019: The tremor is only in the left arm.    The tremors are positional and he notes the intensity is worse when he is resting and the arm relaxed.    He denies difficulty holding a utensil (he is right handed though).    He has not notes it is worse if emotional though he is worse when he tenses the muscles of the left arm.    He notes no tremor on the right side.   His wife notes a little quiver in the lips.    Gait is fine and he can walk up to 6 miles.   He has no change in his stride.   No change in voice or swallowing.   He has had a dry cough at night some times.     He has been on a keto diet x 3 years but does have occasional  more variety.     He notes more trouble with the left shoulder (he has known DJD).    He has HTN.  He does not have DM.    January 2020 his TSH was elevated at 6.01 and it was normal 2 years ago.   He has no FH of tremor.     REVIEW OF SYSTEMS: Constitutional: No fevers, chills, sweats, or change in appetite Eyes: No visual changes, double vision, eye pain Ear, nose and throat: No hearing loss, ear pain, nasal congestion, sore throat Cardiovascular: No chest pain, palpitations Respiratory: No shortness of breath at rest or with exertion.   No wheezes GastrointestinaI: No nausea, vomiting, diarrhea, abdominal pain, fecal incontinence Genitourinary: No dysuria, urinary retention or frequency.  No nocturia. Musculoskeletal: No neck pain, back pain Integumentary: No rash, pruritus, skin lesions Neurological: as above Psychiatric: No depression at this time.  No anxiety Endocrine: No palpitations, diaphoresis, change in appetite, change in weigh or increased thirst Hematologic/Lymphatic: No anemia, purpura, petechiae. Allergic/Immunologic: No itchy/runny eyes, nasal congestion, recent allergic reactions, rashes  ALLERGIES: No Known Allergies  HOME MEDICATIONS:  Current Outpatient Medications:  .  alclomethasone (ACLOVATE) 0.05 % cream, Apply 1 application topically daily. , Disp: , Rfl:  .  Probiotic Product (PROBIOTIC FORMULA PO), Take 1 capsule by mouth daily. Align , Disp: , Rfl:  .  propranolol (INDERAL) 40 MG tablet, Take 1 tablet (40 mg total) by mouth 3 (three) times daily., Disp: 90 tablet, Rfl: 5 .  triamcinolone cream (KENALOG) 0.1 %, , Disp: , Rfl:  .  amLODipine (NORVASC) 5 MG tablet, Take 1.5 tablets (7.5 mg total) by mouth daily., Disp: 135 tablet, Rfl: 1 .  primidone (MYSOLINE) 50 MG tablet, 1 po qAM and 2 po qHS, Disp: 90 tablet, Rfl: 5  PAST MEDICAL HISTORY: Past Medical History:  Diagnosis Date  . Bowel habit changes 06/2016   on Ketogenic diet/ does not go to  the BR but every 3 days  . BPH (benign prostatic hyperplasia)   . Diverticulosis of colon 2008  . External hemorrhoids   . Gilbert's syndrome   . Heart murmur   . History of anesthesia complications    Per pt, gets very light-headed past sedation/ fainted 1 time!  . Hyperlipidemia   . Hypertension   . Skin cancer    X2 in [place]; now seeing Dr Georgia Duff    PAST SURGICAL HISTORY: Past Surgical History:  Procedure Laterality Date  . ASD [place] EXTRACTION  July 2015   bilateral eyes; Dr Thyra Breed, [facility]  . COLONOSCOPY  2008   [facility]  . INGUINAL HERNIA REPAIR  2006  . [place]  . open heart   7025   74 year old  . TONSILLECTOMY     as child    FAMILY HISTORY: Family History  Problem Relation Age of Onset  . Hypertension Mother   . Other Mother        intestine blockage-burst  . Emphysema Father        smoker  . Cancer Neg Hx   . Diabetes Neg Hx   . Heart disease Neg Hx   . Stroke Neg Hx   . Colon cancer Neg Hx     SOCIAL HISTORY:  Social History   Socioeconomic History  . Marital status: Married    Spouse name: Jonnatan Appleby  . Number of children: 0  . Years of education: BA  . Highest education level: Not on file  Occupational History  . Occupation: Retired  Tobacco Use  . Smoking status: Never Smoker  . Smokeless tobacco: Never Used  Substance and Sexual Activity  . Alcohol use: Yes    Comment: every 2 weeks wine per pt  . Drug use: No  . Sexual activity: Yes  Other Topics Concern  . Not on file  Social History Narrative      Caffeine use: coffee daily   Right handed    Social Determinants of Health   Financial Resource Strain:   . Difficulty of Paying Living Expenses:   Food Insecurity:   . Worried About Charity fundraiser in the Last Year:   . Arboriculturist in the Last Year:   Transportation Needs:   . Film/video editor (Medical):   Marland Kitchen Lack of  Transportation (Non-Medical):   Physical Activity:   . Days of Exercise per Week:   . Minutes of Exercise per Session:   Stress:   . Feeling of Stress :   Social Connections:   . Frequency of Communication with Friends and Family:   . Frequency of Social Gatherings with Friends and Family:   .  Attends Religious Services:   . Active Member of Clubs or Organizations:   . Attends Archivist Meetings:   Marland Kitchen Marital Status:   Intimate Partner Violence:   . Fear of Current or Ex-Partner:   . Emotionally Abused:   Marland Kitchen Physically Abused:   . Sexually Abused:      PHYSICAL EXAM  Vitals:   05/22/19 1115  BP: 130/78  Pulse: (!) 53  Temp: (!) 97.1 F (36.2 C)  SpO2: 96%  Weight: 163 lb 8 oz (74.2 kg)  Height: 6\' 1"  (1.854 m)    Body mass index is 21.57 kg/m.   General: The patient is well-developed and well-nourished and in no acute distress.  No rashes or edema.  HEENT:  Head is [place]/AT.  Sclera are anicteric.     Neurologic Exam  Mental status: The patient is alert and oriented x 3 at the time of the examination. The patient has apparent normal recent and remote memory, with an apparently normal attention span and concentration ability.   Speech is normal.  Cranial nerves: Extraocular movements are full. Pupils are equal, round, and reactive to light and accomodation.  Visual fields are full.  Facial symmetry is present. There is good facial sensation to soft touch bilaterally.Facial strength is normal.  Trapezius and sternocleidomastoid strength is normal. No dysarthria is noted.    No obvious hearing deficits are noted.  Motor: He has a 6 Hz tremor in the left hand.  It was slightly worse when he drew spirals.  Muscle bulk is normal.   Tone is normal. Strength is  5 / 5 in all 4 extremities.   Sensory: Sensory testing is intact to touch and vibration Coordination: Cerebellar testing reveals good finger-nose-finger and heel-to-shin bilaterally.  Gait and station:  Station is normal.   Gait is normal. Tandem gait is normal for age.  He can turn 180 degrees in 2 steps.  He did have mild retropulsion.   Romberg is negative.   Reflexes: Deep tendon reflexes are symmetric and normal bilaterally.       DIAGNOSTIC DATA (LABS, IMAGING, TESTING) - I reviewed patient records, labs, notes, testing and imaging myself where available.  Lab Results  Component Value Date   WBC 7.8 02/14/2019   HGB 13.3 02/14/2019   HCT 39.3 02/14/2019   MCV 94.4 02/14/2019   PLT 219.0 02/14/2019      Component Value Date/Time   NA 141 04/03/2019 0924   K 4.8 04/03/2019 0924   CL 103 04/03/2019 0924   CO2 24 04/03/2019 0924   GLUCOSE 88 04/03/2019 0924   GLUCOSE 95 02/14/2019 0821   BUN 20 04/03/2019 0924   CREATININE 0.86 04/03/2019 0924   CALCIUM 9.1 04/03/2019 0924   PROT 7.1 02/14/2019 0821   ALBUMIN 4.3 02/14/2019 0821   AST 18 02/14/2019 0821   ALT 14 02/14/2019 0821   ALKPHOS 67 02/14/2019 0821   BILITOT 1.0 02/14/2019 0821   GFRNONAA 85 04/03/2019 0924   GFRAA 98 04/03/2019 0924   Lab Results  Component Value Date   CHOL 164 02/14/2019   HDL 52.80 02/14/2019   LDLCALC 100 (H) 02/14/2019   TRIG 53.0 02/14/2019   CHOLHDL 3 02/14/2019   Lab Results  Component Value Date   HGBA1C 5.5 06/07/2017   Lab Results  Component Value Date   [id number] 02/14/2019   Lab Results  Component Value Date   TSH 4.090 01/21/2019       ASSESSMENT AND PLAN  Tremor  Bradycardia   1.   He has a mild tremor in the left arm that is present at rest and sometimes with intention but not with movement.  He has no other parkinsonian features such as bradykinesia, rigidity or gait disturbance.  There is minimal retropulsion when the posture is largely disturbed disturbance.  He has a normal stride and turns in 2 steps.  We discussed that sometimes it is difficult to know with certainty whether a person has essential tremor or early Parkinson's disease.  I will  have him try Mysoline.  If tremors does not improve, we could consider a trial of low-dose Sinemet.    2.   Stay active and exercise as tolerated. 3.   Return in 6 months or sooner for new or worsening neurologic symptoms.    Jordan Pardini A. Felecia Shelling, MD, [facility] [id number], [id number] PM Certified in Neurology, Clinical Neurophysiology, Sleep Medicine and Neuroimaging  [facility] [address], [place], [place] [address] [phone]

## 2019-06-01 ENCOUNTER — Encounter: Payer: Self-pay | Admitting: Internal Medicine

## 2019-06-30 ENCOUNTER — Other Ambulatory Visit: Payer: Self-pay | Admitting: Neurology

## 2019-06-30 MED ORDER — PRIMIDONE 50 MG PO TABS: ORAL_TABLET | ORAL | 5 refills | Status: DC

## 2019-07-01 ENCOUNTER — Other Ambulatory Visit: Payer: Self-pay

## 2019-07-01 ENCOUNTER — Encounter: Payer: Self-pay | Admitting: Family

## 2019-07-01 ENCOUNTER — Ambulatory Visit (INDEPENDENT_AMBULATORY_CARE_PROVIDER_SITE_OTHER): Payer: Medicare Other | Admitting: Family

## 2019-07-01 VITALS — BP 130/72 | HR 60 | Temp 97.8°F | Wt 161.6 lb

## 2019-07-01 DIAGNOSIS — L03312 Cellulitis of back [any part except buttock]: Secondary | ICD-10-CM | POA: Diagnosis not present

## 2019-07-01 DIAGNOSIS — L309 Dermatitis, unspecified: Secondary | ICD-10-CM | POA: Diagnosis not present

## 2019-07-01 MED ORDER — DOXYCYCLINE HYCLATE 100 MG PO TABS: 100.0000 mg | ORAL_TABLET | Freq: Two times a day (BID) | ORAL | 0 refills | Status: DC

## 2019-07-01 NOTE — Progress Notes (Signed)
Douglas Cavazos. is a 74 y.o. male with the following history as recorded in EpicCare:  Patient Active Problem List   Diagnosis Date Noted  . Bradycardia 05/22/2019  . Other fatigue 05/22/2019  . Tremor 02/14/2019  . Elevated TSH 02/13/2018  . Colon polyps 01/24/2017  . Lightheadedness 09/19/2016  . 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 Right ankle swelling 07/21/2015  . PCO (posterior capsular opacification), right 08/06/2013  . Pseudophakia of both eyes 07/18/2013  . Dermatochalasis of both upper eyelids 05/14/2013  . Right carotid bruit 12/28/2012  . Nuclear cataract 03/28/2012  . 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 Testosterone deficiency 01/26/2009  . Essential hypertension 01/26/2009  . ELECTROCARDIOGRAM, ABNORMAL 01/26/2009  . SKIN CANCER, HX OF 01/26/2009  . ATRIAL SEPTAL DEFECT, HX OF 01/26/2009  . Hyperlipidemia 08/13/2006  . 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 Diverticulosis of large intestine 07/27/2006    Current Outpatient Medications  Medication Sig Dispense Refill  . alclomethasone (ACLOVATE) 0.05 % cream Apply 1 application topically daily.     Marland Kitchen amLODipine (NORVASC) 5 MG tablet Take 1.5 tablets (7.5 mg total) by mouth daily. 135 tablet 1  . primidone (MYSOLINE) 50 MG tablet 2 po bid 120 tablet 5  . Probiotic Product (PROBIOTIC FORMULA PO) Take 1 capsule by mouth daily. Align     . triamcinolone cream (KENALOG) 0.1 %     . doxycycline (VIBRA-TABS) 100 MG tablet Take 1 tablet (100 mg total) by mouth 2 (two) times daily. 20 tablet 0   No current facility-administered medications for this visit.    Allergies: Patient has no known allergies.  Past Medical History:  Diagnosis Date  . Bowel habit changes 06/2016   on Ketogenic diet/ does not go to the BR but every 3 days  . BPH (benign prostatic hyperplasia)   . Diverticulosis of colon 2008  . External hemorrhoids   . Gilbert's syndrome   . Heart murmur   . History of anesthesia complications    Per pt, gets very  light-headed past sedation/ fainted 1 time!  . Hyperlipidemia   . Hypertension   . Skin cancer    X2 in [place]; now seeing Dr Georgia Duff    Past Surgical History:  Procedure Laterality Date  . ASD [place] EXTRACTION  July 2015   bilateral eyes; Dr Thyra Breed, [facility]  . COLONOSCOPY  2008   [facility]  . INGUINAL HERNIA REPAIR  2006  . [place]  . open heart   5119   74 year old  . TONSILLECTOMY     as child    Family History  Problem Relation Age of Onset  . Hypertension Mother   . Other Mother        intestine blockage-burst  . Emphysema Father        smoker  . Cancer Neg Hx   . Diabetes Neg Hx   . Heart disease Neg Hx   . Stroke Neg Hx   . Colon cancer Neg Hx     Social History   Tobacco Use  . Smoking status: Never Smoker  . Smokeless tobacco: Never Used  Substance Use Topics  . Alcohol use: Yes    Comment: every 2 weeks wine per pt    Subjective:  Patient presents with concerns for tick bite- area of concern localized on left lower back; pulled tick off approximately 5 days ago; able to  get full tick off;  Thinks the tick was attached for possibly 2-3 days; denies any fever, headaches, unexplained rash or joint aches;   Objective:  Vitals:   07/01/19 1045  BP: 130/72  Pulse: 60  Temp: 97.8 F (36.6 C)  TempSrc: Oral  SpO2: 98%  Weight: 161 lb 9.6 oz (73.3 kg)    General: Well developed, well nourished, in no acute distress  Skin : Warm and dry. Area of erythema noted on left lower back approximately 2 cm across- no streaking or drainage noted Head: Normocephalic and atraumatic  Lungs: Respirations unlabored;  Musculoskeletal: No deformities; no active joint inflammation  Extremities: No edema, cyanosis, clubbing  Vessels: Symmetric bilaterally  Neurologic: Alert and oriented; speech intact; face symmetrical; moves all extremities well; CNII-XII intact without focal deficit    Assessment:  1. Dermatitis   2. Cellulitis of back except buttock     Plan:  Secondary to tick bite; Rx for Doxycycline 100 mg bid x 10 days; follow-up worse, no better.  This visit occurred during the SARS-CoV-2 public health emergency.  Safety protocols were in place, including screening questions prior to the visit, additional usage of staff PPE, and extensive cleaning of exam room while observing appropriate contact time as indicated for disinfecting solutions.     No follow-ups on file.  No orders of the defined types were placed in this encounter.   Requested Prescriptions   Signed Prescriptions Disp Refills  . doxycycline (VIBRA-TABS) 100 MG tablet 20 tablet 0    Sig: Take 1 tablet (100 mg total) by mouth 2 (two) times daily.

## 2019-07-08 ENCOUNTER — Ambulatory Visit (INDEPENDENT_AMBULATORY_CARE_PROVIDER_SITE_OTHER): Payer: Medicare Other

## 2019-07-08 DIAGNOSIS — Z0000 Encounter for general adult medical examination without abnormal findings: Secondary | ICD-10-CM

## 2019-07-08 NOTE — Progress Notes (Signed)
I connected with Douglas Johnston. Hepp today by telephone and verified that I am speaking with the correct person using two identifiers. Location patient: home Location provider: work Persons participating in the virtual visit: Douglas Johnston. Douglas Johnston and Douglas Johnston. Douglas Vanduyne, LPN   I discussed the limitations, risks, security and privacy concerns of performing an evaluation and management service by telephone and the availability of in person appointments. I also discussed with the patient that there may be a patient responsible charge related to this service. The patient expressed understanding and verbally consented to this telephonic visit.    Interactive audio and video telecommunications were attempted between this provider and patient, however failed, due to patient having technical difficulties OR patient did not have access to video capability.  We continued and completed visit with audio only.  Some vital signs may be absent or patient reported.   Time Spent with patient on telephone encounter: 25 minutes  Subjective:   Douglas Johnston. is a 74 y.o. male who presents for Medicare Annual/Subsequent preventive examination.  Review of Systems    No ROS. Medicare Wellness Virtual Visit. Additional risk factors are reflected in social history. Cardiac Risk Factors include: advanced age (>67men, >29 women);dyslipidemia;family history of premature cardiovascular disease;hypertension;male gender     Objective:    There were no vitals filed for this visit. There is no height or weight on file to calculate BMI.  Advanced Directives 07/08/2019 02/08/2018 09/09/2015  Does Patient Have a Medical Advance Directive? Yes Yes Yes  Type of Paramedic of [place];Living will [place];Living will Living will;Healthcare Power of Attorney  Does patient want to make changes to medical advance directive? No - Patient declined - No - Patient declined    Copy of [place] in Chart? No - copy requested No - copy requested No - copy requested    Current Medications (verified) Outpatient Encounter Medications as of 07/08/2019  Medication Sig  . alclomethasone (ACLOVATE) 0.05 % cream Apply 1 application topically daily.   Marland Kitchen amLODipine (NORVASC) 5 MG tablet Take 1.5 tablets (7.5 mg total) by mouth daily.  Marland Kitchen doxycycline (VIBRA-TABS) 100 MG tablet Take 1 tablet (100 mg total) by mouth 2 (two) times daily.  . primidone (MYSOLINE) 50 MG tablet 2 po bid  . Probiotic Product (PROBIOTIC FORMULA PO) Take 1 capsule by mouth daily. Align   . triamcinolone cream (KENALOG) 0.1 %    No facility-administered encounter medications on file as of 07/08/2019.    Allergies (verified) Patient has no known allergies.   History: Past Medical History:  Diagnosis Date  . Bowel habit changes 06/2016   on Ketogenic diet/ does not go to the BR but every 3 days  . BPH (benign prostatic hyperplasia)   . Diverticulosis of colon 2008  . External hemorrhoids   . Gilbert's syndrome   . Heart murmur   . History of anesthesia complications    Per pt, gets very light-headed past sedation/ fainted 1 time!  . Hyperlipidemia   . Hypertension   . Skin cancer    X2 in [place]; now seeing Dr Georgia Duff   Past Surgical History:  Procedure Laterality Date  . 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 COLONOSCOPY  2008   [facility]  . INGUINAL HERNIA REPAIR  2006  . [place]  [place] , [place]  . open heart   9375   74 year old  . TONSILLECTOMY     as child   Family History  Problem Relation Age of Onset  . Hypertension Mother   . Other Mother        intestine blockage-burst  . Emphysema Father        smoker  . Cancer Neg Hx   . Diabetes Neg Hx   . Heart disease Neg Hx   . Stroke Neg Hx   . Colon cancer Neg Hx    Social History   Socioeconomic  History  . Marital status: Married    Spouse name: Douglas Johnston  . Number of children: 0  . Years of education: BA  . Highest education level: Not on file  Occupational History  . Occupation: Retired  Tobacco Use  . Smoking status: Never Smoker  . Smokeless tobacco: Never Used  Vaping Use  . Vaping Use: Never used  Substance and Sexual Activity  . Alcohol use: Yes    Comment: every 2 weeks wine per pt  . Drug use: No  . Sexual activity: Yes  Other Topics Concern  . Not on file  Social History Narrative      Caffeine use: coffee daily   Right handed    Social Determinants of Health   Financial Resource Strain: Low Risk   . Difficulty of Paying Living Expenses: Not hard at all  Food Insecurity: No Food Insecurity  . Worried About Charity fundraiser in the Last Year: Never true  . Ran Out of Food in the Last Year: Never true  Transportation Needs: No Transportation Needs  . Lack of Transportation (Medical): No  . Lack of Transportation (Non-Medical): No  Physical Activity: Sufficiently Active  . Days of Exercise per Week: 4 days  . Minutes of Exercise per Session: 120 min  Stress: No Stress Concern Present  . Feeling of Stress : Not at all  Social Connections: Unknown  . Frequency of Communication with Friends and Family: More than three times a week  . Frequency of Social Gatherings with Friends and Family: Once a week  . Attends Religious Services: Patient refused  . Active Member of Clubs or Organizations: Patient refused  . Attends Archivist Meetings: Patient refused  . Marital Status: Married    Tobacco Counseling Counseling given: Not Answered   Clinical Intake:  Pre-visit preparation completed: Yes  Pain : No/denies pain     Nutritional Risks: None Diabetes: No  How often do you need to have someone help you when you read instructions, pamphlets, or other written materials from your doctor or pharmacy?: 1 - Never What is the last  grade level you completed in school?: Bachelor's Degree  Diabetic? no  Interpreter Needed?: No  Information entered by :: Douglas Rudy N. Chelcea Zahn, LPN   Activities of Daily Living In your present state of health, do you have any difficulty performing the following activities: 07/08/2019  Hearing? N  Vision? N  Difficulty concentrating or making decisions? N  Walking or climbing stairs? N  Dressing or bathing? N  Doing errands, shopping? N  Preparing Food and eating ? N  Using the Toilet? N  In the past six months, have you accidently leaked urine? N  Do you have problems with loss of bowel control? N  Managing your Medications? N  Managing your Finances? N  Housekeeping or managing your Housekeeping? N  Some recent data might  be hidden    Patient Care Team: Binnie Rail, MD as PCP - General (Internal Medicine)  Indicate any recent Medical Services you may have received from other than [facility] providers in the past year (date may be approximate).     Assessment:   This is a routine wellness examination for [place].  Hearing/Vision screen No exam data present  Dietary issues and exercise activities discussed: Current Exercise Habits: Structured exercise class, Type of exercise: strength training/weights;walking;treadmill (racketball, pickleball), Time (Minutes): 60, Frequency (Times/Week): 5, Weekly Exercise (Minutes/Week): 300, Intensity: Moderate  Goals    . Client understands the importance of follow-up with providers by attending scheduled visits    . Patient Stated     Continue to exercise and eat healthy.       Depression Screen PHQ 2/9 Scores 07/08/2019 02/14/2019 02/08/2018 10/30/2017 10/10/2016 10/01/2015 12/20/2012  PHQ - 2 Score 0 0 0 0 0 0 0  PHQ- 9 Score - - - 0 - - -    Fall Risk Fall Risk  07/08/2019 02/14/2019 02/08/2018 10/30/2017 10/10/2016  Falls in the past year? 0 0 0 No No  Number falls in past yr: 0 0 - - -  Injury with Fall? 0 - - - -  Risk for fall due  to : No Fall Risks - - - -  Follow up Falls evaluation completed - - - -    Any stairs in or around the home? Yes  If so, are there any without handrails? No  Home free of loose throw rugs in walkways, pet beds, electrical cords, etc? Yes  Adequate lighting in your home to reduce risk of falls? Yes   ASSISTIVE DEVICES UTILIZED TO PREVENT FALLS:  Life alert? No  Use of a cane, walker or w/c? No  Grab bars in the bathroom? Yes  Shower chair or bench in shower? No  Elevated toilet seat or a handicapped toilet? No   TIMED UP AND GO:  Was the test performed? No .  Length of time to ambulate 10 feet: 0 sec.   Gait steady and fast without use of assistive device (per patient)  Cognitive Function: not indicated; patient is cogitatively intact.        Immunizations Immunization History  Administered Date(s) Administered  . Fluad Quad(high Dose 65+) 10/10/2018  . Influenza, High Dose Seasonal PF 10/16/2012, 10/01/2015, 10/10/2016, 10/30/2017  . Influenza,inj,Quad PF,6+ Mos 11/28/2013  . Influenza-Unspecified 11/02/2014  . PFIZER SARS-COV-2 Vaccination 02/13/2019, 03/11/2019  . Pneumococcal Conjugate-13 12/25/2014  . Pneumococcal Polysaccharide-23 12/08/2011  . Td 02/14/2010  . Zoster 01/07/2014    TDAP status: Up to date Flu Vaccine status: Up to date Pneumococcal vaccine status: Up to date Covid-19 vaccine status: Completed vaccines  Qualifies for Shingles Vaccine? Yes   Zostavax completed Yes   Shingrix Completed?: No.    Education has been provided regarding the importance of this vaccine. Patient has been advised to call insurance company to determine out of pocket expense if they have not yet received this vaccine. Advised may also receive vaccine at local pharmacy or Health Dept. Verbalized acceptance and understanding.  Screening Tests Health Maintenance  Topic Date Due  . INFLUENZA VACCINE  08/10/2019  . COLONOSCOPY  01/19/2020  . TETANUS/TDAP  02/15/2020  .  COVID-19 Vaccine  Completed  . Hepatitis C Screening  Completed  . PNA vac Low Risk Adult  Completed    Health Maintenance  There are no preventive care reminders to display for this patient.  Colorectal cancer screening: Completed 01/18/2017. Repeat every 3 years  Lung Cancer Screening: (Low Dose CT Chest recommended if Age 30-80 years, 30 pack-year currently smoking OR have quit w/in 15years.) does not qualify.   Lung Cancer Screening Referral: no  Additional Screening:  Hepatitis C Screening: does qualify; Completed yes  Vision Screening: Recommended annual ophthalmology exams for early detection of glaucoma and other disorders of the eye. Is the patient up to date with their annual eye exam?  Yes  Who is the provider or what is the name of the office in which the patient attends annual eye exams? Marilynne Halsted, MD If pt is not established with a provider, would they like to be referred to a provider to establish care? No .   Dental Screening: Recommended annual dental exams for proper oral hygiene  Community Resource Referral / Chronic Care Management: CRR required this visit?  No   CCM required this visit?  No      Plan:     I have personally reviewed and noted the following in the patient's chart:   . Medical and social history . Use of alcohol, tobacco or illicit drugs  . Current medications and supplements . Functional ability and status . Nutritional status . Physical activity . Advanced directives . List of other physicians . Hospitalizations, surgeries, and ER visits in previous 12 months . Vitals . Screenings to include cognitive, depression, and falls . Referrals and appointments  In addition, I have reviewed and discussed with patient certain preventive protocols, quality metrics, and best practice recommendations. A written personalized care plan for preventive services as well as general preventive health recommendations were provided to  patient.     Sheral Flow, LPN   [id number]   Nurse Notes:  There were no vitals filed for this visit. There is no height or weight on file to calculate BMI.. Cognitive Function: not indicated; patient is cogitatively intact. Per patient, gait is steady and fast without use of assistive devices.

## 2019-07-08 NOTE — Patient Instructions (Addendum)
Douglas Johnston , Thank you for taking time to come for your Medicare Wellness Visit. I appreciate your ongoing commitment to your health goals. Please review the following plan we discussed and let me know if I can assist you in the future.   Screening recommendations/referrals: Colonoscopy: 01/18/2017; due every 3 years Recommended yearly ophthalmology/optometry visit for glaucoma screening and checkup Recommended yearly dental visit for hygiene and checkup  Vaccinations: Influenza vaccine: 10/10/2018 Pneumococcal vaccine: completed Tdap vaccine: 02/14/2010; due every 10 years Shingles vaccine: never done   Covid-19: completed Pfizer Zoster vaccine: 01/07/2014  Advanced directives: Please bring a copy of your health care power of attorney and living will to the office at your convenience.  Conditions/risks identified: Please continue to do your personal lifestyle choices by: daily care of teeth and gums, regular physical activity (goal should be 5 days a week for 30 minutes), eat a healthy diet, avoid tobacco and drug use, limiting any alcohol intake, taking a low-dose aspirin (if not allergic or have been advised by your provider otherwise) and taking vitamins and minerals as recommended by your provider. Continue doing brain stimulating activities (puzzles, reading, adult coloring books, staying active) to keep memory sharp. Continue to eat heart healthy diet (full of fruits, vegetables, whole grains, lean protein, water--limit salt, fat, and sugar intake) and increase physical activity as tolerated.  Next appointment: Please schedule your next Medicare Wellness Visit with your Nurse Health Advisor in 1 year.  Preventive Care 33 Years and Older, Male Preventive care refers to lifestyle choices and visits with your health care provider that can promote health and wellness. What does preventive care include?  A yearly physical exam. This is also called an annual well check.  Dental exams once  or twice a year.  Routine eye exams. Ask your health care provider how often you should have your eyes checked.  Personal lifestyle choices, including:  Daily care of your teeth and gums.  Regular physical activity.  Eating a healthy diet.  Avoiding tobacco and drug use.  Limiting alcohol use.  Practicing safe sex.  Taking low doses of aspirin every day.  Taking vitamin and mineral supplements as recommended by your health care provider. What happens during an annual well check? The services and screenings done by your health care provider during your annual well check will depend on your age, overall health, lifestyle risk factors, and family history of disease. Counseling  Your health care provider may ask you questions about your:  Alcohol use.  Tobacco use.  Drug use.  Emotional well-being.  Home and relationship well-being.  Sexual activity.  Eating habits.  History of falls.  Memory and ability to understand (cognition).  Work and work Statistician. Screening  You may have the following tests or measurements:  Height, weight, and BMI.  Blood pressure.  Lipid and cholesterol levels. These may be checked every 5 years, or more frequently if you are over 22 years old.  Skin check.  Lung cancer screening. You may have this screening every year starting at age 7 if you have a 30-pack-year history of smoking and currently smoke or have quit within the past 15 years.  Fecal occult blood test (FOBT) of the stool. You may have this test every year starting at age 49.  Flexible sigmoidoscopy or colonoscopy. You may have a sigmoidoscopy every 5 years or a colonoscopy every 10 years starting at age 42.  Prostate cancer screening. Recommendations will vary depending on your family history and other risks.  Hepatitis C blood test.  Hepatitis B blood test.  Sexually transmitted disease (STD) testing.  Diabetes screening. This is done by checking your blood  sugar (glucose) after you have not eaten for a while (fasting). You may have this done every 1-3 years.  Abdominal aortic aneurysm (AAA) screening. You may need this if you are a current or former smoker.  Osteoporosis. You may be screened starting at age 48 if you are at high risk. Talk with your health care provider about your test results, treatment options, and if necessary, the need for more tests. Vaccines  Your health care provider may recommend certain vaccines, such as:  Influenza vaccine. This is recommended every year.  Tetanus, diphtheria, and acellular pertussis (Tdap, Td) vaccine. You may need a Td booster every 10 years.  Zoster vaccine. You may need this after age 41.  Pneumococcal 13-valent conjugate (PCV13) vaccine. One dose is recommended after age 19.  Pneumococcal polysaccharide (PPSV23) vaccine. One dose is recommended after age 27. Talk to your health care provider about which screenings and vaccines you need and how often you need them. This information is not intended to replace advice given to you by your health care provider. Make sure you discuss any questions you have with your health care provider. Document Released: 01/22/2015 Document Revised: 09/15/2015 Document Reviewed: 10/27/2014 Elsevier Interactive Patient Education  2017 [place] Prevention in the Home Falls can cause injuries. They can happen to people of all ages. There are many things you can do to make your home safe and to help prevent falls. What can I do on the outside of my home?  Regularly fix the edges of walkways and driveways and fix any cracks.  Remove anything that might make you trip as you walk through a door, such as a raised step or threshold.  Trim any bushes or trees on the path to your home.  Use bright outdoor lighting.  Clear any walking paths of anything that might make someone trip, such as rocks or tools.  Regularly check to see if handrails are loose or  broken. Make sure that both sides of any steps have handrails.  Any raised decks and porches should have guardrails on the edges.  Have any leaves, snow, or ice cleared regularly.  Use sand or salt on walking paths during winter.  Clean up any spills in your garage right away. This includes oil or grease spills. What can I do in the bathroom?  Use night lights.  Install grab bars by the toilet and in the tub and shower. Do not use towel bars as grab bars.  Use non-skid mats or decals in the tub or shower.  If you need to sit down in the shower, use a plastic, non-slip stool.  Keep the floor dry. Clean up any water that spills on the floor as soon as it happens.  Remove soap buildup in the tub or shower regularly.  Attach bath mats securely with double-sided non-slip rug tape.  Do not have throw rugs and other things on the floor that can make you trip. What can I do in the bedroom?  Use night lights.  Make sure that you have a light by your bed that is easy to reach.  Do not use any sheets or blankets that are too big for your bed. They should not hang down onto the floor.  Have a firm chair that has side arms. You can use this for support while you  get dressed.  Do not have throw rugs and other things on the floor that can make you trip. What can I do in the kitchen?  Clean up any spills right away.  Avoid walking on wet floors.  Keep items that you use a lot in easy-to-reach places.  If you need to reach something above you, use a strong step stool that has a grab bar.  Keep electrical cords out of the way.  Do not use floor polish or wax that makes floors slippery. If you must use wax, use non-skid floor wax.  Do not have throw rugs and other things on the floor that can make you trip. What can I do with my stairs?  Do not leave any items on the stairs.  Make sure that there are handrails on both sides of the stairs and use them. Fix handrails that are  broken or loose. Make sure that handrails are as long as the stairways.  Check any carpeting to make sure that it is firmly attached to the stairs. Fix any carpet that is loose or worn.  Avoid having throw rugs at the top or bottom of the stairs. If you do have throw rugs, attach them to the floor with carpet tape.  Make sure that you have a light switch at the top of the stairs and the bottom of the stairs. If you do not have them, ask someone to add them for you. What else can I do to help prevent falls?  Wear shoes that:  Do not have high heels.  Have rubber bottoms.  Are comfortable and fit you well.  Are closed at the toe. Do not wear sandals.  If you use a stepladder:  Make sure that it is fully opened. Do not climb a closed stepladder.  Make sure that both sides of the stepladder are locked into place.  Ask someone to hold it for you, if possible.  Clearly mark and make sure that you can see:  Any grab bars or handrails.  First and last steps.  Where the edge of each step is.  Use tools that help you move around (mobility aids) if they are needed. These include:  Canes.  Walkers.  Scooters.  Crutches.  Turn on the lights when you go into a dark area. Replace any light bulbs as soon as they burn out.  Set up your furniture so you have a clear path. Avoid moving your furniture around.  If any of your floors are uneven, fix them.  If there are any pets around you, be aware of where they are.  Review your medicines with your doctor. Some medicines can make you feel dizzy. This can increase your chance of falling. Ask your doctor what other things that you can do to help prevent falls. This information is not intended to replace advice given to you by your health care provider. Make sure you discuss any questions you have with your health care provider. Document Released: 10/22/2008 Document Revised: 06/03/2015 Document Reviewed: 01/30/2014 Elsevier  Interactive Patient Education  2017 [employer].

## 2019-07-28 ENCOUNTER — Encounter: Payer: Self-pay | Admitting: Internal Medicine

## 2019-07-28 ENCOUNTER — Other Ambulatory Visit: Payer: Self-pay | Admitting: Neurology

## 2019-07-28 MED ORDER — CARBIDOPA-LEVODOPA 25-100 MG PO TABS: ORAL_TABLET | ORAL | 5 refills | Status: DC

## 2019-08-25 DIAGNOSIS — Z961 Presence of intraocular lens: Secondary | ICD-10-CM | POA: Diagnosis not present

## 2019-10-02 DIAGNOSIS — D1801 Hemangioma of skin and subcutaneous tissue: Secondary | ICD-10-CM | POA: Diagnosis not present

## 2019-10-02 DIAGNOSIS — C44311 Basal cell carcinoma of skin of nose: Secondary | ICD-10-CM | POA: Diagnosis not present

## 2019-10-02 DIAGNOSIS — C44319 Basal cell carcinoma of skin of other parts of face: Secondary | ICD-10-CM | POA: Diagnosis not present

## 2019-10-02 DIAGNOSIS — L821 Other seborrheic keratosis: Secondary | ICD-10-CM | POA: Diagnosis not present

## 2019-10-02 DIAGNOSIS — Z85828 Personal history of other malignant neoplasm of skin: Secondary | ICD-10-CM | POA: Diagnosis not present

## 2019-10-02 DIAGNOSIS — C44619 Basal cell carcinoma of skin of left upper limb, including shoulder: Secondary | ICD-10-CM | POA: Diagnosis not present

## 2019-10-10 ENCOUNTER — Other Ambulatory Visit: Payer: Self-pay

## 2019-10-10 ENCOUNTER — Ambulatory Visit (INDEPENDENT_AMBULATORY_CARE_PROVIDER_SITE_OTHER): Payer: Medicare Other

## 2019-10-10 DIAGNOSIS — Z23 Encounter for immunization: Secondary | ICD-10-CM | POA: Diagnosis not present

## 2019-10-29 DIAGNOSIS — C4401 Basal cell carcinoma of skin of lip: Secondary | ICD-10-CM | POA: Diagnosis not present

## 2019-11-12 ENCOUNTER — Telehealth: Payer: Self-pay

## 2019-11-12 NOTE — Telephone Encounter (Signed)
Called patient back to let him know that Dr. Johnsie Cancel wanted him to come in to the office and check an EKG. Dr. Johnsie Cancel had an open office visit next week. Made patient an office visit. Patient verbalized understanding. Patient stated he is not on inderal at this time.

## 2019-11-12 NOTE — Telephone Encounter (Signed)
-----   Message from Josue Hector, MD sent at 11/12/2019 12:57 PM EDT ----- We did not prescribe his inderal was put on it for tremor. He has history of tri fasicular block would cut his inderal in half to start and may need to stop it Sinemet should not cause any issues Will try to get him in office and check ECG  ----- Message ----- From: Irine Seal, MD Sent: 11/12/2019  11:04 AM EDT To: Josue Hector, MD, Britt Bottom, MD, #  I saw Douglas Johnston today and he was noted to have a HR of 32 on first check.  His BP was normal.   I rechecked him at the end of the visit and the HR was up to 45 with some irregularity.  He reports he can get into the 40's but the 32 was concerning.  He has recently started Sinemet and wondered if that might be associated with the heart rate.   He has some fatigue but no other symptoms.

## 2019-11-12 NOTE — Progress Notes (Signed)
Cardiology Office Note   Date:  11/18/2019   ID:  Douglas Hammond., DOB December 22, 1945, MRN [id number]  PCP:  Binnie Rail, MD  Cardiologist:   Jenkins Rouge, MD   No chief complaint on file.     History of Present Illness: Douglas Lewin. is a 74 y.o. 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 Has been on BP meds for years. Tries to follow low sodium diet. Red wine seems to bring BP down. 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 Ineffective and started on norvasc.  He is active playing racquet ball 6 hours / week   Retired from [employer] from [place]  Married no children     Echo reviewed 07/19/16 EF 60-65% mild LVH Trivial MR/AR moderate LAE RV mildly dilated estimated PA 30 mmHg  Bubble study negative right to left shunt  Myovue 07/13/16 no ischemia or infarct EF estimated 50% normal by echo see above   Seen by primary 02/14/19 and has developed tremor in left hand/arm Norvasc changed to propanolol Tended to run low HR in 50's before this Since has been diagnosed with Parkinson's and started on Sinemet and Primidone  Seen by Dr Jeffie Pollock 11/11/19 and worried that HR was in 30-40 range Told to stop beta blocker  Today HR 72 with bigeminy suspect there was a pulse deficit when Dr Jeffie Pollock examined him with non transmitted PVCls  NO cardiac symptoms   Biggest issue is declining cognitive function and tremors. Wants 2 nd opinion regarding diagnosis of Parkinson's  Currently seeing Dr Felecia Shelling    Past Medical History:  Diagnosis Date  . Bowel habit changes 06/2016   on Ketogenic diet/ does not go to the BR but every 3 days  . BPH (benign prostatic hyperplasia)   . Diverticulosis of colon 2008  . External hemorrhoids   . Gilbert's syndrome   . Heart murmur   . History of anesthesia complications    Per pt, gets very light-headed past sedation/ fainted 1 time!  .  Hyperlipidemia   . Hypertension   . Skin cancer    X2 in [place]; now seeing Dr Georgia Duff    Past Surgical History:  Procedure Laterality Date  . ASD [place] EXTRACTION  July 2015   bilateral eyes; Dr Thyra Breed, [facility]  . COLONOSCOPY  2008   [facility]  . INGUINAL HERNIA REPAIR  2006  . [place]  . open heart   3676   74 year old  . TONSILLECTOMY     as child     Current Outpatient Medications  Medication Sig Dispense Refill  . alclomethasone (ACLOVATE) 0.05 % cream Apply 1 application topically daily.     Marland Kitchen amLODipine (NORVASC) 5 MG tablet Take 1.5 tablets (7.5 mg total) by mouth daily. 135 tablet 1  . carbidopa-levodopa (SINEMET IR) 25-100 MG tablet 1/2 po tid.  Can increase to one po tid after a week 90 tablet 5  . Multiple Vitamin (MULTIVITAMIN) tablet Take 1 tablet by mouth daily.    . Probiotic Product (PROBIOTIC FORMULA PO) Take 1 capsule by mouth daily. Align      No current facility-administered medications for this visit.    Allergies:   Patient has no known allergies.  Social History:  The patient  reports that he has never smoked. He has never used smokeless tobacco. He reports current alcohol use. He reports that he does not use drugs.   Family History:  The patient's family history includes Emphysema in his father; Hypertension in his mother; Other in his mother.    ROS:  Please see the history of present illness.   Otherwise, review of systems are positive for none.   All other systems are reviewed and negative.    PHYSICAL EXAM: VS:  Ht 6' (1.829 m)   Wt 71.2 kg   BMI 21.29 kg/m  , BMI Body mass index is 21.29 kg/m. Affect appropriate Healthy:  appears stated age 27: normal Neck supple with no adenopathy JVP normal no bruits no thyromegaly Lungs clear with no wheezing and good diaphragmatic motion Heart:  S1/S2 no murmur, no rub, gallop or click PMI normal Abdomen:  benighn, BS positve, no tenderness, no AAA no bruit.  No HSM or HJR Distal pulses intact with no bruits No edema Neuro resting tremor in left hand  Skin warm and dry No muscular weakness   EKG:  02/08/18 SR rate 59 LAE nonspecific ST changes 02/21/19 SR rate 55 lateral T  Wave changes  11/18/19 SR bigeminny tsyr 71 bpm   Recent Labs: 01/21/2019: TSH 4.090 02/14/2019: ALT 14; Hemoglobin 13.3; Platelets 219.0 04/03/2019: BUN 20; Creatinine, Ser 0.86; Potassium 4.8; Sodium 141    Lipid Panel    Component Value Date/Time   CHOL 164 02/14/2019 0821   CHOL 186 01/07/2014 1137   TRIG 53.0 02/14/2019 0821   TRIG 64 01/07/2014 1137   HDL 52.80 02/14/2019 0821   HDL 56 01/07/2014 1137   CHOLHDL 3 02/14/2019 0821   VLDL 10.6 02/14/2019 0821   LDLCALC 100 (H) 02/14/2019 0821   LDLCALC 117 (H) 01/07/2014 1137      Wt Readings from Last 3 Encounters:  11/18/19 71.2 kg  07/01/19 73.3 kg  05/22/19 74.2 kg      Other studies Reviewed: Additional studies/ records that were reviewed today include: Notes Dr Quay Burow labs and ECG .Echo 07/19/16 Myovue 07/13/16     ASSESSMENT AND PLAN:  1. HTN  Norvasc changed to inderal by primary 02/14/19 but patient has bifasicular block and bradycardia Now back on norvasc  2. ASD Repair intact by echo 07/07/16 negative bubble study  3. Varicosities:  F/u Dr Oneida Alar no phlebitis  4.Bradycardia/ Bifasicular Block: bigeminny in clinic 14 day event monitor no indication for PPM Beta blocker d/c   5. Parkinson's: on mysoline and sinemet f/u neurology wants 2nd opinion refer to Dr Tat   Jenkins Rouge

## 2019-11-14 ENCOUNTER — Telehealth: Payer: Self-pay | Admitting: Cardiovascular Disease

## 2019-11-14 NOTE — Telephone Encounter (Signed)
Patient's wife is requesting to speak with Dr. Kyla Balzarine nurse to discuss instructions on how patient is to take amLODipine (NORVASC). Please advise.

## 2019-11-14 NOTE — Telephone Encounter (Signed)
Called patient back about his message. Patient stated he was told to cut a medication in half. Informed him that was inderal and he was no longer taking it, so he should continue to take his other medications as prescribed.

## 2019-11-18 ENCOUNTER — Ambulatory Visit: Payer: Medicare Other | Admitting: Cardiovascular Disease

## 2019-11-18 ENCOUNTER — Encounter: Payer: Self-pay | Admitting: Cardiovascular Disease

## 2019-11-18 ENCOUNTER — Other Ambulatory Visit: Payer: Self-pay

## 2019-11-18 VITALS — BP 130/78 | HR 71 | Ht 72.0 in | Wt 157.0 lb

## 2019-11-18 DIAGNOSIS — I498 Other specified cardiac arrhythmias: Secondary | ICD-10-CM

## 2019-11-18 DIAGNOSIS — R001 Bradycardia, unspecified: Secondary | ICD-10-CM

## 2019-11-18 DIAGNOSIS — R251 Tremor, unspecified: Secondary | ICD-10-CM | POA: Diagnosis not present

## 2019-11-18 NOTE — Patient Instructions (Addendum)
Medication Instructions:  Your physician recommends that you continue on your current medications as directed. Please refer to the Current Medication list given to you today.  Labwork: NONE  Testing/Procedures: Your physician has recommended that you wear an event monitor for 14 days. Event monitors are medical devices that record the heart's electrical activity. Doctors most often [place] these monitors to diagnose arrhythmias. Arrhythmias are problems with the speed or rhythm of the heartbeat. The monitor is a small, portable device. You can wear one while you do your normal daily activities. This is usually used to diagnose what is causing palpitations/syncope (passing out).  Follow-Up: Your physician wants you to follow-up in: 6 months with Dr. Johnsie Cancel. You will receive a reminder letter in the mail two months in advance. If you don't receive a letter, please call our office to schedule the follow-up appointment.   If you need a refill on your cardiac medications before your next appointment, please call your pharmacy.

## 2019-11-18 NOTE — Addendum Note (Signed)
Addended by: Stephani Police on: 11/18/2019 11:02 AM   Modules accepted: Orders

## 2019-11-20 ENCOUNTER — Other Ambulatory Visit: Payer: Self-pay | Admitting: Neurology

## 2019-11-20 DIAGNOSIS — M25531 Pain in right wrist: Secondary | ICD-10-CM | POA: Diagnosis not present

## 2019-11-20 NOTE — Progress Notes (Signed)
Assessment/Plan:   1.   idiopathic Parkinson's disease.  The patient has tremor, bradykinesia, rigidity and mild postural instability.  -We discussed the diagnosis as well as pathophysiology of the disease.  We discussed treatment options as well as prognostic indicators.  Patient education was provided.  -Discussed concept of levodopa resistant tremor, which he may have.  I did not want increase his medication today since it was my first visit.  I also wanted to move his dosages closer together, so he was not taking the last medication at bedtime.  He will take carbidopa/levodopa 25/100, 1 tablet at 7 AM/11 AM/4 PM.  We discussed relationship of medication to meals, keeping at least 30 minutes before the meal or 1 hour after.  He is following the keto diet, so needs to be careful with the protein.  -We discussed community resources in the area including patient support groups and community exercise programs for PD and pt education was provided to the patient.  -Discussed surgical interventions, which he does not need right now.  -He asked me about DaTscan.  We discussed this, but I discussed with him that his DaTscan would most certainly show loss of uptake, at least in the right side if not both sides of the stratum.  I do not think that this adds anything.  He meets [place] brain bank criteria and modified MDS criteria for the diagnosis of Parkinson's disease and thereby has Parkinson's disease.   2.  Hx of skin CA/melanoma  -Patient following with dermatology.  Parkinson's disease also slightly increases risk for melanoma so will need to be followed in that regard.  3.  Patient was given the option of following up here or with [facility] neurology.  He has an appointment with Dr. Felecia Shelling in 2 days.  Ultimately, he decided that he would like to follow-up here.  In that case, I will plan on seeing him back in the next 4 to 5 months, sooner should new neurologic issues arise.  Subjective:   Douglas Johnston. was seen today in the movement disorders clinic for neurologic consultation at the request of Josue Hector, MD.  The consultation is for the evaluation of tremor.  Patient currently under the care of Dr. Felecia Shelling.  Primary care physician is Dr. Quay Burow.  Patient was first referred to Dr. Felecia Shelling by Dr. Quay Burow on January 21, 2019.  Records are reviewed.  On first visit, Dr. Felecia Shelling noted left hand tremor and retropulsion.  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  He was started on propranolol, which was titrated upward to 40 mg 3 times per day, but the patient developed some bradycardia.  When he followed up in May, 2021, Dr. Felecia Shelling noted left hand rest tremor.  He recommended primidone.  That initially helped.  About a month later, they increased the medication to 100 mg twice per day.  That increase did not seem to help, and the patient emailed back Dr. Felecia Shelling on July 29, 2019 and they discussed levodopa, which patient is now on.  He takes that at 7am/1-2pm/10pm.  He initially thought that it was helpful but isn't sure now.    Tremor: Yes.     How long has it been going on? Started October, 2020  At rest or with activation?  Rest and "if I move it sometimes I can stop the tremor."  When is it noted the most?  rest  Fam hx of tremor?  No.  Located where?  Left hand tremor, some mouth tremor  Affected by caffeine:  No.  Affected by alcohol:  No.  Affected by stress:  Yes.    Affected by fatigue:  No.  Other Specific Symptoms:  Voice: no change Sleep: sleeps well  Vivid Dreams:  Yes.    Acting out dreams:  Yes.  , some screaming Wet Pillows: No. Postural symptoms:  Yes.  , slight change per pt - notes it with exercise lunge  Falls?  No. Bradykinesia symptoms: difficulty getting out of a chair (attributes to back issues); slower mental processing Loss of smell:  No. Loss of taste:  No. Urinary Incontinence:  No. Difficulty Swallowing:  No. Handwriting, micrographia:  No. Trouble with ADL's:  No.  Trouble buttoning clothing: No. Depression:  No. Memory changes:  Yes.   - word finding trouble; slower to process things Hallucinations:  No.  visual distortions: No. N/V:  No. Lightheaded:  Yes.    - related to keto diet  Syncope: No. Diplopia:  No. Dyskinesia:  No.  Neuroimaging of the brain has not previously been performed.    PREVIOUS MEDICATIONS:  propranolol 40 mg 3 times per day (bradycardia); primidone 100 mg twice daily; levodopa  ALLERGIES:  No Known Allergies  CURRENT MEDICATIONS:  Current Outpatient Medications  Medication Instructions  . alclomethasone (ACLOVATE) 8.33 % cream 1 application, Topical, Daily  . amLODipine (NORVASC) 5 MG tablet TAKE 1&1/2 TABLETS ONCE DAILY.  . carbidopa-levodopa (SINEMET IR) 25-100 MG tablet 1 tablet, Oral, 3 times daily  . Multiple Vitamin (MULTIVITAMIN) tablet 1 tablet, Oral, Daily  . Probiotic Product (PROBIOTIC FORMULA PO) 1 capsule, Oral, Daily, Align    Objective:   PHYSICAL EXAMINATION:    VITALS:   Vitals:   11/24/19 1358  BP: 138/75  Pulse: 64  SpO2: 96%  Weight: 159 lb (72.1 kg)  Height: 6' (1.829 m)    GEN:  The patient appears stated age and is in NAD. HEENT:  Normocephalic, atraumatic.  The mucous membranes are moist. The superficial temporal arteries are without ropiness or tenderness. CV:  RRR Lungs:  CTAB Neck/HEME:  There are no carotid bruits bilaterally.  Neurological examination:  Orientation: The patient is alert and oriented x3.  Cranial nerves: There is good facial symmetry.  Extraocular muscles are intact. The visual fields are full to confrontational testing. The speech is fluent and clear. Soft palate rises symmetrically and there is no tongue deviation. Hearing is intact to conversational tone. Sensation: Sensation is intact to light touch throughout (facial, trunk, extremities). Vibration is intact at the bilateral big toe. There is no extinction with double  simultaneous stimulation.  Motor: Strength is 5/5 in the bilateral upper and lower extremities.   Shoulder shrug is equal and symmetric.  There is no pronator drift. Deep tendon reflexes: Deep tendon reflexes are 0-1/4 at the bilateral biceps, triceps, brachioradialis, patella and achilles. Plantar responses are downgoing bilaterally.  Movement examination: Tone: There is mild increased tone in the LUE that becomes mod with activation procedures Abnormal movements: there is LUE rest tremor that is worse with activation Coordination:  There is  decremation with RAM's, with any form of RAMS, including alternating supination and pronation of the forearm, hand opening and closing, finger taps on the L.  They are nl on the R.  Heel and toe taps are good bilaterally Gait and Station: The patient has no difficulty arising out of a deep-seated chair without the use of the hands. The patient's stride length is good  with decreased arm swing on the left.  The patient has a negative pull test.     I have reviewed and interpreted the following labs independently   Chemistry      Component Value Date/Time   NA 141 04/03/2019 0924   K 4.8 04/03/2019 0924   CL 103 04/03/2019 0924   CO2 24 04/03/2019 0924   BUN 20 04/03/2019 0924   CREATININE 0.86 04/03/2019 0924      Component Value Date/Time   CALCIUM 9.1 04/03/2019 0924   ALKPHOS 67 02/14/2019 0821   AST 18 02/14/2019 0821   ALT 14 02/14/2019 0821   BILITOT 1.0 02/14/2019 0821      Lab Results  Component Value Date   TSH 4.090 01/21/2019   Lab Results  Component Value Date   WBC 7.8 02/14/2019   HGB 13.3 02/14/2019   HCT 39.3 02/14/2019   MCV 94.4 02/14/2019   PLT 219.0 02/14/2019      Total time spent on today's visit was 60 minutes, including both face-to-face time and nonface-to-face time.  Time included that spent on review of records (prior notes available to me/labs/imaging if pertinent), discussing treatment and goals, answering  patient's questions and coordinating care.  Cc:  Binnie Rail, MD

## 2019-11-24 ENCOUNTER — Ambulatory Visit: Payer: Medicare Other | Admitting: Neurology

## 2019-11-24 ENCOUNTER — Encounter: Payer: Self-pay | Admitting: Neurology

## 2019-11-24 ENCOUNTER — Other Ambulatory Visit: Payer: Self-pay

## 2019-11-24 VITALS — BP 138/75 | HR 64 | Ht 72.0 in | Wt 159.0 lb

## 2019-11-24 DIAGNOSIS — G20 Parkinson's disease: Secondary | ICD-10-CM

## 2019-11-24 NOTE — Patient Instructions (Signed)
Take your carbidopa/levodopa 25/100 at 7am/11am/4pm.  As a reminder, carbidopa/levodopa can be taken at the same time as a carbohydrate, but we like to have you take your pill either 30 minutes before a protein source or 1 hour after as protein can interfere with carbidopa/levodopa absorption.   The physicians and staff at [facility] are committed to providing excellent care. You may receive a survey requesting feedback about your experience at our office. We strive to receive "very good" responses to the survey questions. If you feel that your experience would prevent you from giving the office a "very good " response, please contact our office to try to remedy the situation. We may be reached at [phone]. Thank you for taking the time out of your busy day to complete the survey.  Parkinsons [employer]   . Local [place] Online Groups  o Power over [employer] Group :   - Power Over Parkinson's Patient Education Group will be Wednesday, November 10th at 2pm via [place].   - Upcoming Power over Parkinson's Meetings:  2nd Wednesdays of the month at 2 pm:       December 8th, January 12th - Amy Marriott, PT at [facility] has resumed the lead of this group starting in July.  Contact Amy at [email or web address] if interested in participating in this online group o Parkinson's Care Partners Group:    3rd Mondays, Contact Corwin Levins o Atypical Parkinsonian Patient Group:   4th Wednesdays, Contact Corwin Levins o If you are interested in participating in these online groups with Judson Roch, please contact her directly for how to join those meetings.  Her contact information is [email or web address].  She will send you a link to join the [employer].  (Please note that Corwin Levins , MSW, LCSW, has resigned her position at [facility], but will continue to lead the online groups temporarily) .  Marland Kitchen [place]:   [email or web address] o PD Health at Home continues:  Mindfulness Mondays, Expert Briefing Tuesdays, Wellness Wednesdays, Take Time Thursdays, Fitness Fridays  o Upcoming Webinar:  The Skinny on Skin and Bone Health in Parkinson's.  Wednesday, December 1st at 1 pm o Please check out their website to sign up for emails and see their full online offerings .  Marland Kitchen [place]:  [email or web address]  o Upcoming Webinar:   Steps Closer to Stopping Parkinson's:  2021 Research Review, Thursday, November 18th at 12 noon. o Check out additional information on their website to see their full online offerings .  Marland Kitchen [place]:  [email or web address] o Upcoming Webinar:  Non-Motor Symptom Medications in Parkinson's.  Wednesday, November 10th at 2 pm. o Care Partner Monthly Meetup.  With Robin Searing [place].  First Tuesday of each month, 2 pm o Check out additional information to Live Well Today on their website .  Marland Kitchen Parkinson and Movement Disorders (PMD) Alliance:  www.pmdalliance.org o [employer] Online:  Online Education Events o Sign up for emails, which are sent weekly to give you updates on programming and online offerings .  Marland Kitchen Parkinson's Association of the [place]:  www.[email or web address] o Information on online support groups, online exercises including Yoga, Parkinson's exercises and more-LOTS of information on links to PD resources and online events o Virtual Support Group through Parkinson's Association of the [place]; next one is scheduled for Wednesday, December 10, 2019 at 2 pm. (These are typically scheduled for the 1st Wednesday of the month at 2 pm).  Visit website for details. .  . Additional  links for movement activities: o PWR! Moves Classes at [place] RESUMED, at a limited capacity.  We have several openings for Wednesday 10 am and 11 am classes.  Contact Amy Marriott, PT [email or web address] or [phone] if  interested o Here is a link to the PWR!Moves classes on Zoom from [place] - Daily Mon-Sat at 10:00. Via Zoom, FREE and open to all.  There is also a link below via Facebook if you use that platform. - [email or web address].si - https://www.PrepaidParty.no o Parkinson's Wellness Recovery (PWR! Moves)  www.pwr4life.org - Info on the PWR! Virtual Experience:  You will have access to our expertise through self-assessment, guided plans that start with the PD-specific fundamentals, educational content, tips, Q&A with an expert, and a growing Art therapist of PD-specific pre-recorded and live exercise classes of varying types and intensity - both physical and cognitive! If that is not enough, we offer 1:1 wellness consultations (in-person or virtual) to personalize your PWR! Research scientist (medical).  - Check out the PWR! Move of the month on the [place] [facility] website:  [email or web address] o [employer] Fridays:  - As part of the PD Health @ Home program, this free video series focuses each week on one aspect of fitness designed to support people living with Parkinson's.  -  HollywoodSale.dk o Dance for PD website is offering free, live-stream classes throughout the week, as well as links to [employer] of classes:  [email or web address]://danceforparkinsons.org/ o Transport planner for Parkinson's Class:  [place] is back this Fall!  Free offering for people with Parkinson's and care partners; virtual class this Fall. The class will be Wednesdays 4-5pm beginning 10/13.  Classes will run for 9 weeks 10/13-12/15, with no class on 11/24.  Register  below: o https://[email or web address]?[email or web address]&SEASON=Parkinsons+Dance-Fall+2021  o For more information, contact [phone] or email Ruffin Frederick at [email or web address] o Virtual dance and Pilates for Parkinson's classes: Click on the Community Tab> Parkinson's Movement Initiative Tab.  To register for classes and for more information, visit www.[employer] and click the "community" tab.  o [employer] Parkinson's Cycling Classes  - [employer]: 1pm on Fridays-Live classes at [facility] at [email or web address] or [phone]) [phone] [employer]: Virtual Classes Mondays and Thursdays (contact [place] at [email or web address] or [phone]) .  o [phone] - Three levels of classes are offered Tuesdays and Thursdays:  10:30 am,  12 noon & 1:45 pm at 02-23-1987. To observe a class or for  more information, call [phone] or email [email or web address] . [employer]: o [phone] Opportunities:  www.well-springsolutions.org/caregiver-education/caregiver-support-group.  You may also contact Chief Technology Officer at [email or web address] or [phone].   o Caregiver Virtual Event:   [employer] is Partnering with [employer], on Friday, November 19th from 11:30-12:30 for a virtual event - Contact [phone] (above) for details o Well[employer]Spring Navigator:  Just1Navigator program, a free service to help individuals and families through the journey of determining care for older adults.  The "Navigator" is a 01-20-2000, Vickki Muff, who will speak with a prospective client and/or loved ones to provide an assessment of the situation and a set of recommendations for a personalized care plan -- all free of charge, and whether [employer] offers the needed service or not. If the need is not a service we provide, we are  well-connected with reputable programs in town that we can refer you to.  www.well-springsolutions.org or to speak with the Navigator, call [phone].

## 2019-11-26 ENCOUNTER — Ambulatory Visit: Payer: Medicare Other | Admitting: Neurology

## 2019-11-27 ENCOUNTER — Telehealth: Payer: Self-pay | Admitting: *Deleted

## 2019-11-27 NOTE — Telephone Encounter (Signed)
Patient called stating he had not received his cardiac event monitor.  Order for cardiac event monitor had been linked to 11/18/2019 OV with Dr. Johnsie Cancel, therefore, had not dropped into monitor work que to be ordered.  This will happen if monitor is ordered with status normal instead of future.  Order has been unlinked and patient has been enrolled for [employer] to ship a 14 day cardiac event monitor to his home.

## 2019-12-07 ENCOUNTER — Encounter (INDEPENDENT_AMBULATORY_CARE_PROVIDER_SITE_OTHER): Payer: Medicare Other

## 2019-12-07 DIAGNOSIS — I498 Other specified cardiac arrhythmias: Secondary | ICD-10-CM

## 2019-12-07 DIAGNOSIS — R001 Bradycardia, unspecified: Secondary | ICD-10-CM | POA: Diagnosis not present

## 2019-12-07 DIAGNOSIS — I493 Ventricular premature depolarization: Secondary | ICD-10-CM | POA: Diagnosis not present

## 2019-12-08 DIAGNOSIS — M25531 Pain in right wrist: Secondary | ICD-10-CM | POA: Diagnosis not present

## 2019-12-18 ENCOUNTER — Other Ambulatory Visit: Payer: Self-pay

## 2019-12-18 ENCOUNTER — Telehealth: Payer: Self-pay | Admitting: Cardiovascular Disease

## 2019-12-18 ENCOUNTER — Ambulatory Visit (INDEPENDENT_AMBULATORY_CARE_PROVIDER_SITE_OTHER): Payer: Medicare Other

## 2019-12-18 DIAGNOSIS — I498 Other specified cardiac arrhythmias: Secondary | ICD-10-CM

## 2019-12-18 NOTE — Progress Notes (Signed)
1.) Reason for visit: EKG  2.) Name of MD requesting visit: Dr. Radford Pax  3.) H&P: Patient has history of HTN, ASD repair, Varicosities, Bradycardia/Bifasicular Block, and Parkinson's. Patient wearing 14 day event monitor due to having bigeminy in clinic on 11/18/19. Monitor showed critical EKG today, so DOD wanted patient to come in to see if patient is having A. Fib or just having artifact from movement from his Parkinson's.  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 Radford Pax reviewed. Patient is NSR with septal infarct PVC. Will continue to monitor with 14 day event monitor.

## 2019-12-18 NOTE — Telephone Encounter (Signed)
Spoke with [place] from [place] who reports received monitor alert showing AFIB with rate of 80-90.  She will fax report to office now for review and recommendation.

## 2019-12-18 NOTE — Patient Instructions (Signed)
Medication Instructions:  *If you need a refill on your cardiac medications before your next appointment, please call your pharmacy*  Lab Work: If you have labs (blood work) drawn today and your tests are completely normal, you will receive your results only by: Marland Kitchen MyChart Message (if you have MyChart) OR . A paper copy in the mail If you have any lab test that is abnormal or we need to change your treatment, we will call you to review the results.  Follow-Up: As previously planned.

## 2019-12-18 NOTE — Telephone Encounter (Signed)
Strips received and reviewed with Dr. Radford Pax (DOD). Per Dr. Radford Pax, "NSR with PVC baseline Wandering baseline artifact limits ability for accurate assessment Needs to come in for EKG"  The patient states he had no symptoms this morning and he was likely out taking a walk. He will come in at [facility] for an EKG. He was grateful for assistance.

## 2019-12-18 NOTE — Telephone Encounter (Signed)
[place] is calling with a critical EKG results to report to a nurse. Please advise.

## 2019-12-25 ENCOUNTER — Other Ambulatory Visit: Payer: Self-pay | Admitting: Cardiovascular Disease

## 2019-12-25 DIAGNOSIS — I498 Other specified cardiac arrhythmias: Secondary | ICD-10-CM

## 2019-12-25 DIAGNOSIS — R001 Bradycardia, unspecified: Secondary | ICD-10-CM

## 2019-12-30 ENCOUNTER — Telehealth: Payer: Self-pay

## 2019-12-30 DIAGNOSIS — I4891 Unspecified atrial fibrillation: Secondary | ICD-10-CM

## 2019-12-30 NOTE — Telephone Encounter (Signed)
Placed order for EP referral  

## 2019-12-30 NOTE — Telephone Encounter (Signed)
-----   Message from Josue Hector, MD sent at 12/26/2019  7:52 PM EST ----- Your monitor shows two issues. You are having periods of atrial fibrillation and may need to be on a blood thinner. You also have electrical conduction issues that we noted on your ECG and monitor shows that sometimes the top of your heart doesn't communicate with the bottom I will have you see our EP ( electrophysiology or electrical doctors) to discuss. I don't think you need a pacemaker quite yet but suspect you will need one eventually We stopped your inderal to help with this as it slows the heart rate.   Sent this message to patient please arrange EP f/u

## 2020-01-12 DIAGNOSIS — Z20822 Contact with and (suspected) exposure to covid-19: Secondary | ICD-10-CM | POA: Diagnosis not present

## 2020-01-13 ENCOUNTER — Telehealth (INDEPENDENT_AMBULATORY_CARE_PROVIDER_SITE_OTHER): Payer: Medicare Other | Admitting: Family Medicine

## 2020-01-13 DIAGNOSIS — R059 Cough, unspecified: Secondary | ICD-10-CM

## 2020-01-13 DIAGNOSIS — R0981 Nasal congestion: Secondary | ICD-10-CM | POA: Diagnosis not present

## 2020-01-13 MED ORDER — BENZONATATE 100 MG PO CAPS: 200.0000 mg | ORAL_CAPSULE | Freq: Three times a day (TID) | ORAL | 0 refills | Status: DC | PRN

## 2020-01-13 NOTE — Patient Instructions (Signed)
-  I sent the medication(s) we discussed to your pharmacy: Meds ordered this encounter  Medications  . benzonatate (TESSALON PERLES) 100 MG capsule    Sig: Take 2 capsules (200 mg total) by mouth 3 (three) times daily as needed.    Dispense:  20 capsule    Refill:  0   Nasal saline twice daily  Drink adequate water  Warm liquids, lemon and honey  Humidifier at night  I hope you are feeling better soon!  Seek in person care promptly if your symptoms worsen, new concerns arise or you are not improving with treatment over the next several days.  It was nice to meet you today. I help [facility] out with telemedicine visits on Tuesdays and Thursdays and am available for visits on those days. If you have any concerns or questions following this visit please schedule a follow up visit with your Primary Care doctor or seek care at a local urgent care clinic to avoid delays in care.

## 2020-01-13 NOTE — Progress Notes (Signed)
Virtual Visit via Video Note  I connected with Douglas Johnston  on 01/13/20 at  4:20 PM EST by a video enabled telemedicine application and verified that I am speaking with the correct person using two identifiers.  Location patient: home, [place] Location provider:work or home office Persons participating in the virtual visit: patient, provider  I discussed the limitations of evaluation and management by telemedicine and the availability of in person appointments. The patient expressed understanding and agreed to proceed.   HPI:  Acute telemedicine visit for a cough: -Onset: 3 days ago -Symptoms include: runny nose, pnd, cough, a little hoarseness, lots of drainage -he did a covid test at [facility] yesterday with rapid antigen test and pcr test which  -Denies: sob, cp, fever, body aches, nvd, inability to eat/drink or get out of bed, known sick contacts -COVID-19 vaccine status: fully vaccinated for covid + booster, also had flu shot this  ROS: See pertinent positives and negatives per HPI.  Past Medical History:  Diagnosis Date  . Bowel habit changes 06/2016   on Ketogenic diet/ does not go to the BR but every 3 days  . BPH (benign prostatic hyperplasia)   . Diverticulosis of colon 2008  . External hemorrhoids   . Gilbert's syndrome   . Heart murmur   . History of anesthesia complications    Per pt, gets very light-headed past sedation/ fainted 1 time!  . Hyperlipidemia   . Hypertension   . Skin cancer    X2 in [place]; now seeing Dr Georgia Duff    Past Surgical History:  Procedure Laterality Date  . ASD [place] EXTRACTION  July 2015   bilateral eyes; Dr Thyra Breed, [facility]  . COLONOSCOPY  2008   [facility]  . INGUINAL HERNIA REPAIR  2006  . [facility]  . open heart   1429   75 year old  . TONSILLECTOMY     as child     Current Outpatient Medications:  .  benzonatate (TESSALON PERLES) 100 MG  capsule, Take 2 capsules (200 mg total) by mouth 3 (three) times daily as needed., Disp: 20 capsule, Rfl: 0 .  alclomethasone (ACLOVATE) 0.05 % cream, Apply 1 application topically daily. , Disp: , Rfl:  .  amLODipine (NORVASC) 5 MG tablet, TAKE 1&1/2 TABLETS ONCE DAILY., Disp: 135 tablet, Rfl: 0 .  carbidopa-levodopa (SINEMET IR) 25-100 MG tablet, Take 1 tablet by mouth 3 (three) times daily., Disp: , Rfl:  .  Multiple Vitamin (MULTIVITAMIN) tablet, Take 1 tablet by mouth daily., Disp: , Rfl:  .  Probiotic Product (PROBIOTIC FORMULA PO), Take 1 capsule by mouth daily. Align , Disp: , Rfl:   EXAM:  VITALS per patient if applicable:  GENERAL: alert, oriented, appears well and in no acute distress  HEENT: atraumatic, conjunttiva clear, no obvious abnormalities on inspection of external nose and ears  NECK: normal movements of the head and neck  LUNGS: on inspection no signs of respiratory distress, breathing rate appears normal, no obvious gross SOB, gasping or wheezing  CV: no obvious cyanosis  MS: moves all visible extremities without noticeable abnormality  PSYCH/NEURO: pleasant and cooperative, no obvious depression or anxiety, speech and thought processing grossly intact  ASSESSMENT AND PLAN:  Discussed the following assessment and plan:  Cough  Nasal congestion  -we discussed possible serious and likely etiologies, options for evaluation and workup, limitations of telemedicine visit  vs in person visit, treatment, treatment risks and precautions. Pt prefers to treat via telemedicine empirically rather than in person at this moment.  Query likely viral upper respiratory illness versus other.  Opted to treat with Tessalon for cough, nasal saline, humidifier, analgesic if needed.  Discussed potential complications, other potential etiologies, precautions.  Advised to seek prompt in person care if worsening, new symptoms arise, or if is not improving with treatment. Discussed  options for inperson care if PCP office not available.    I discussed the assessment and treatment plan with the patient. The patient was provided an opportunity to ask questions and all were answered. The patient agreed with the plan and demonstrated an understanding of the instructions.     Terressa Koyanagi, DO

## 2020-01-15 NOTE — Progress Notes (Signed)
Virtual Visit via Video Note  I connected with Douglas Johnston. on 01/16/20 at 10:45 AM EST by a video enabled telemedicine application and verified that I am speaking with the correct person using two identifiers.   I discussed the limitations of evaluation and management by telemedicine and the availability of in person appointments. The patient expressed understanding and agreed to proceed.  Present for the visit:  Myself, Dr Cheryll Cockayne, Bettina Gavia and his wife.  The patient is currently at home and I am in the office.    No referring provider.    History of Present Illness: He is here for an acute visit for cold symptoms.  He had a video visit on 01/13/20 with Dr Dahlia Client for the same.  His symptoms started around 01/10/20 and at that time he complained of runny nose, PND, cough, hoarseness, PND.    He was diagnosed with a viral infection and started on tessalon perles and otc medications. He did have a bad reaction to tessalon perles - has coughing fits, gasping, increased cough, wheeze and SOB.  His covid test was negative   His symptoms are not getting better.  He states fever of 100.2, congestion, sinus pain, PND, cough with yellow sputum, headaches.      Review of Systems  Constitutional: Positive for fever (100.2).  HENT: Positive for congestion and sinus pain. Negative for ear pain and sore throat.        PND  Respiratory: Positive for cough and sputum production. Negative for shortness of breath and wheezing.   Musculoskeletal: Negative for myalgias.  Neurological: Positive for headaches.      Social History   Socioeconomic History  . Marital status: Married    Spouse name: Douglas Johnston  . Number of children: 0  . Years of education: BA  . Highest education level: Not on file  Occupational History  . Occupation: Retired  Tobacco Use  . Smoking status: Never Smoker  . Smokeless tobacco: Never Used  Vaping Use  . Vaping Use: Never used   Substance and Sexual Activity  . Alcohol use: Yes    Comment: wine once a week  . Drug use: No  . Sexual activity: Yes  Other Topics Concern  . Not on file  Social History Narrative      Caffeine use: coffee daily   Right handed    Social Determinants of Health   Financial Resource Strain: Low Risk   . Difficulty of Paying Living Expenses: Not hard at all  Food Insecurity: No Food Insecurity  . Worried About Programme researcher, broadcasting/film/video in the Last Year: Never true  . Ran Out of Food in the Last Year: Never true  Transportation Needs: No Transportation Needs  . Lack of Transportation (Medical): No  . Lack of Transportation (Non-Medical): No  Physical Activity: Sufficiently Active  . Days of Exercise per Week: 4 days  . Minutes of Exercise per Session: 120 min  Stress: No Stress Concern Present  . Feeling of Stress : Not at all  Social Connections: Unknown  . Frequency of Communication with Friends and Family: More than three times a week  . Frequency of Social Gatherings with Friends and Family: Once a week  . Attends Religious Services: Patient refused  . Active Member of Clubs or Organizations: Patient refused  . Attends Banker Meetings: Patient refused  . Marital Status: Married     Observations/Objective: Appears well in NAD Breathing normally, intermittent cough Skin  appears warm and dry  Assessment and Plan:  See Problem List for Assessment and Plan of chronic medical problems.   Follow Up Instructions:    I discussed the assessment and treatment plan with the patient. The patient was provided an opportunity to ask questions and all were answered. The patient agreed with the plan and demonstrated an understanding of the instructions.   The patient was advised to call back or seek an in-person evaluation if the symptoms worsen or if the condition fails to improve as anticipated.    Binnie Rail, MD

## 2020-01-16 ENCOUNTER — Telehealth (INDEPENDENT_AMBULATORY_CARE_PROVIDER_SITE_OTHER): Payer: Medicare Other | Admitting: Internal Medicine

## 2020-01-16 ENCOUNTER — Other Ambulatory Visit: Payer: Self-pay

## 2020-01-16 ENCOUNTER — Telehealth: Payer: Self-pay | Admitting: Internal Medicine

## 2020-01-16 ENCOUNTER — Encounter: Payer: Self-pay | Admitting: Internal Medicine

## 2020-01-16 ENCOUNTER — Institutional Professional Consult (permissible substitution): Payer: Medicare Other | Admitting: Internal Medicine

## 2020-01-16 DIAGNOSIS — J209 Acute bronchitis, unspecified: Secondary | ICD-10-CM | POA: Insufficient documentation

## 2020-01-16 MED ORDER — AZITHROMYCIN 250 MG PO TABS: ORAL_TABLET | ORAL | 0 refills | Status: DC

## 2020-01-16 MED ORDER — HYDROCOD POLST-CPM POLST ER 10-8 MG/5ML PO SUER: 5.0000 mL | Freq: Two times a day (BID) | ORAL | 0 refills | Status: DC | PRN

## 2020-01-16 NOTE — Assessment & Plan Note (Signed)
Acute Was seen several days ago - thought to be viral - cough is getting worse - concern for bacterial cause - will start zpak tussionex for cough Had reaction to tessalon - added to allergy/intolerance list

## 2020-01-16 NOTE — Telephone Encounter (Signed)
[facility] FYI    Caller states started feeling cold like symptoms earlier this week. COVID test negative. Had a virtual visit with MD and was prescribed Tessalon perls were prescribed for cough. First time taken Tuesday and developed worse cough but also told his wife it was happening today. Caller states that her husband was prescribed Benzonate 100 mg for cough. No fever.  [facility] advised: See PCP within 24 Hours   Patient was already had an appt with Dr.Burns for 1.7.22 at 1045

## 2020-01-20 NOTE — Progress Notes (Signed)
Virtual Visit via Video Note  I connected with Douglas Johnston. on 01/21/20 at  9:00 AM EST by a video enabled telemedicine application and verified that I am speaking with the correct person using two identifiers.   I discussed the limitations of evaluation and management by telemedicine and the availability of in person appointments. The patient expressed understanding and agreed to proceed.  Present for the visit:  Myself, Dr Billey Gosling, Douglas Johnston.  The patient is currently at home and I am in the office.    No referring provider.    History of Present Illness: This is an acute visit for cough.   He was seen 1/4 and 1/7 for cold symptoms.  His covid test was negative.   He started a zpak on 1/7.  He is taking tussionex.  He is better.  He has rumbling in the chest. He has a lot of congestion and mucus in the chest - not much is coming up.  He has had occasional wheeze.  He denies shortness of breath or fevers.  He still has a nasal congestion and sinus pain.  He feels he should be much better than he is now and him and his wife are concerned about the possibility of pneumonia.  They wonder about a chest x-ray.  Review of Systems  Constitutional: Negative for chills and fever.  HENT: Positive for congestion and sinus pain.   Respiratory: Positive for cough, sputum production and wheezing (rumbling in chest). Negative for shortness of breath.   Musculoskeletal: Negative for myalgias.  Neurological: Negative for headaches.      Social History   Socioeconomic History  . Marital status: Married    Spouse name: Douglas Johnston  . Number of children: 0  . Years of education: BA  . Highest education level: Not on file  Occupational History  . Occupation: Retired  Tobacco Use  . Smoking status: Never Smoker  . Smokeless tobacco: Never Used  Vaping Use  . Vaping Use: Never used  Substance and Sexual Activity  . Alcohol use: Yes    Comment: wine once a week  .  Drug use: No  . Sexual activity: Yes  Other Topics Concern  . Not on file  Social History Narrative      Caffeine use: coffee daily   Right handed    Social Determinants of Health   Financial Resource Strain: Low Risk   . Difficulty of Paying Living Expenses: Not hard at all  Food Insecurity: No Food Insecurity  . Worried About Charity fundraiser in the Last Year: Never true  . Ran Out of Food in the Last Year: Never true  Transportation Needs: No Transportation Needs  . Lack of Transportation (Medical): No  . Lack of Transportation (Non-Medical): No  Physical Activity: Sufficiently Active  . Days of Exercise per Week: 4 days  . Minutes of Exercise per Session: 120 min  Stress: No Stress Concern Present  . Feeling of Stress : Not at all  Social Connections: Unknown  . Frequency of Communication with Friends and Family: More than three times a week  . Frequency of Social Gatherings with Friends and Family: Once a week  . Attends Religious Services: Patient refused  . Active Member of Clubs or Organizations: Patient refused  . Attends Archivist Meetings: Patient refused  . Marital Status: Married     Observations/Objective: Appears well in NAD Frequent coughing throughout the visit No obvious shortness of breath  Assessment and Plan:  See Problem List for Assessment and Plan of chronic medical problems.   Follow Up Instructions:    I discussed the assessment and treatment plan with the patient. The patient was provided an opportunity to ask questions and all were answered. The patient agreed with the plan and demonstrated an understanding of the instructions.   The patient was advised to call back or seek an in-person evaluation if the symptoms worsen or if the condition fails to improve as anticipated.    Binnie Rail, MD

## 2020-01-21 ENCOUNTER — Encounter: Payer: Self-pay | Admitting: Internal Medicine

## 2020-01-21 ENCOUNTER — Ambulatory Visit (INDEPENDENT_AMBULATORY_CARE_PROVIDER_SITE_OTHER): Payer: Medicare Other

## 2020-01-21 ENCOUNTER — Telehealth (INDEPENDENT_AMBULATORY_CARE_PROVIDER_SITE_OTHER): Payer: Medicare Other | Admitting: Internal Medicine

## 2020-01-21 DIAGNOSIS — R059 Cough, unspecified: Secondary | ICD-10-CM | POA: Diagnosis not present

## 2020-01-21 DIAGNOSIS — I517 Cardiomegaly: Secondary | ICD-10-CM | POA: Diagnosis not present

## 2020-01-21 DIAGNOSIS — J209 Acute bronchitis, unspecified: Secondary | ICD-10-CM

## 2020-01-21 MED ORDER — CEFDINIR 300 MG PO CAPS: 300.0000 mg | ORAL_CAPSULE | Freq: Two times a day (BID) | ORAL | 0 refills | Status: DC

## 2020-01-21 MED ORDER — HYDROCOD POLST-CPM POLST ER 10-8 MG/5ML PO SUER: 5.0000 mL | Freq: Two times a day (BID) | ORAL | 0 refills | Status: DC | PRN

## 2020-01-21 NOTE — Assessment & Plan Note (Signed)
Subacute Just completed Z-Pak and there has been some improvement, but not enough.  Discussed that the medication is still in his system and is a 10-day antibiotic Will add Omnicef 300 mg twice daily for 10 days for additional coverage Continue Tussionex cough syrup-sent to pharmacy Since he is COVID-negative I will have him get a chest x-ray today Continue over-the-counter cold medications, Mucinex and vitamins Rest, fluids Call if no improvement

## 2020-01-24 ENCOUNTER — Encounter: Payer: Self-pay | Admitting: Internal Medicine

## 2020-01-24 DIAGNOSIS — R42 Dizziness and giddiness: Secondary | ICD-10-CM | POA: Diagnosis not present

## 2020-02-05 ENCOUNTER — Other Ambulatory Visit: Payer: Self-pay | Admitting: Neurology

## 2020-02-07 ENCOUNTER — Other Ambulatory Visit: Payer: Self-pay | Admitting: Neurology

## 2020-02-09 ENCOUNTER — Other Ambulatory Visit: Payer: Self-pay | Admitting: Neurology

## 2020-02-10 DIAGNOSIS — M25531 Pain in right wrist: Secondary | ICD-10-CM | POA: Diagnosis not present

## 2020-02-10 NOTE — Telephone Encounter (Signed)
Rx(s) sent to pharmacy electronically.  

## 2020-02-11 ENCOUNTER — Encounter: Payer: Self-pay | Admitting: Internal Medicine

## 2020-02-11 ENCOUNTER — Other Ambulatory Visit: Payer: Self-pay

## 2020-02-11 ENCOUNTER — Ambulatory Visit: Payer: Medicare Other | Admitting: Internal Medicine

## 2020-02-11 VITALS — BP 136/82 | HR 80 | Ht 72.0 in | Wt 155.0 lb

## 2020-02-11 DIAGNOSIS — R001 Bradycardia, unspecified: Secondary | ICD-10-CM

## 2020-02-11 NOTE — Patient Instructions (Addendum)

## 2020-02-11 NOTE — Progress Notes (Signed)
HPI Douglas Johnston is referred by Dr. Johnsie Cancel for evaluation of heart block. He is a pleasant 75 yo man with a h/o HTN, remote ASD repair, and Parkinsons. He also has PVC's. 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 He also has frequent PVC's including bigeminy.  He has not had syncope. He does have mild fatigue though he attributes this to his Keto diet. He has lost 30 lbs. His wife notes he may consume more caffeine then he should.  Allergies  Allergen Reactions  . Tessalon [Benzonatate] Other (See Comments)    Inc cough, wheeze, SOB     Current Outpatient Medications  Medication Sig Dispense Refill  . alclomethasone (ACLOVATE) 0.05 % cream Apply 1 application topically daily.     Marland Kitchen amLODipine (NORVASC) 5 MG tablet TAKE 1&1/2 TABLETS ONCE DAILY. 135 tablet 0  . carbidopa-levodopa (SINEMET IR) 25-100 MG tablet Take 1 tablet by mouth 3 (three) times daily. 90 tablet 0  . Multiple Vitamin (MULTIVITAMIN) tablet Take 1 tablet by mouth daily.    . Probiotic Product (PROBIOTIC FORMULA PO) Take 1 capsule by mouth daily. Align     No current facility-administered medications for this visit.     Past Medical History:  Diagnosis Date  . Bowel habit changes 06/2016   on Ketogenic diet/ does not go to the BR but every 3 days  . BPH (benign prostatic hyperplasia)   . Diverticulosis of colon 2008  . External hemorrhoids   . Gilbert's syndrome   . Heart murmur   . History of anesthesia complications    Per pt, gets very light-headed past sedation/ fainted 1 time!  . Hyperlipidemia   . Hypertension   . Skin cancer    X2 in [place]; now seeing Dr Georgia Duff    ROS:   All systems reviewed and negative except as noted in the HPI.   Past Surgical History:  Procedure Laterality Date  . ASD [place] EXTRACTION  July 2015   bilateral eyes; Dr Thyra Breed, [facility]  . COLONOSCOPY  2008   [facility]   . INGUINAL HERNIA REPAIR  2006  . [place]  . open heart   4047   75 year old  . TONSILLECTOMY     as child     Family History  Problem Relation Age of Onset  . Hypertension Mother   . Other Mother        intestine blockage-burst  . Emphysema Father        smoker  . Cancer Neg Hx   . Diabetes Neg Hx   . Heart disease Neg Hx   . Stroke Neg Hx   . Colon cancer Neg Hx      Social History   Socioeconomic History  . Marital status: Married    Spouse name: Douglas Johnston  . Number of children: 0  . Years of education: BA  . Highest education level: Not on file  Occupational History  . Occupation: Retired  Tobacco Use  . Smoking status: Never Smoker  . Smokeless tobacco: Never Used  Vaping Use  . Vaping Use: Never used  Substance and Sexual Activity  . Alcohol use: Yes    Comment: wine once a week  . Drug use: No  . Sexual activity: Yes  Other Topics Concern  . Not on file  Social History Narrative      Caffeine use: coffee daily   Right handed    Social Determinants of Health   Financial Resource Strain: Low Risk   . Difficulty of Paying Living Expenses: Not hard at all  Food Insecurity: No Food Insecurity  . Worried About Charity fundraiser in the Last Year: Never true  . Ran Out of Food in the Last Year: Never true  Transportation Needs: No Transportation Needs  . Lack of Transportation (Medical): No  . Lack of Transportation (Non-Medical): No  Physical Activity: Sufficiently Active  . Days of Exercise per Week: 4 days  . Minutes of Exercise per Session: 120 min  Stress: No Stress Concern Present  . Feeling of Stress : Not at all  Social Connections: Unknown  . Frequency of Communication with Friends and Family: More than three times a week  . Frequency of Social Gatherings with Friends and Family: Once a week  . Attends Religious Services: Patient refused  . Active Member of Clubs or Organizations: Patient  refused  . Attends Archivist Meetings: Patient refused  . Marital Status: Married  Human resources officer Violence: Not At Risk  . Fear of Current or Ex-Partner: No  . Emotionally Abused: No  . Physically Abused: No  . Sexually Abused: No     Ht 6' (1.829 m)   Wt 155 lb (70.3 kg)   BMI 21.02 kg/m   Physical Exam:  Well appearing NAD HEENT: Unremarkable Neck:  No JVD, no thyromegally Lymphatics:  No adenopathy Back:  No CVA tenderness Lungs:  Clear with no wheezes HEART:  Regular rate rhythm, no murmurs, no rubs, no clicks Abd:  soft, positive bowel sounds, no organomegally, no rebound, no guarding Ext:  2 plus pulses, no edema, no cyanosis, no clubbing Skin:  No rashes no nodules Neuro:  CN II through XII intact, motor grossly intact  EKG - nsr with RBBB and PVC's   Assess/Plan: 1. PVC's - he is not symptomatic in that he does not have palpitations. We discussed the treatment. Unfortunately medical therapy would result in the need for a PPM. I also discussed catheter ablation but I do not get the sense that he is symptomatic enough to recommend ablation. 2. 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 Again he is minimally if at all symptomatic. I gave him an idea of the symptoms he might experience and recommended watchful waiting and avoidance of AV nodal blocking drugs.  3. Parkinson's - this complicates things as we know that he is at risk for passing out from vasodepressor problems. He has not had syncope.  4. Atrial fib - I have reviewed and am not convinced that he has atrial fib. He has a tremor and I think there is noise artifact on the monitor.   [name],MD

## 2020-02-21 ENCOUNTER — Other Ambulatory Visit: Payer: Self-pay | Admitting: Neurology

## 2020-02-23 ENCOUNTER — Other Ambulatory Visit: Payer: Self-pay | Admitting: Internal Medicine

## 2020-03-09 ENCOUNTER — Other Ambulatory Visit: Payer: Self-pay | Admitting: Neurology

## 2020-03-15 NOTE — Patient Instructions (Addendum)
Blood work was ordered.     Medications changes include :   none     Please followup in 1 year    Health Maintenance, Male Adopting a healthy lifestyle and getting preventive care are important in promoting health and wellness. Ask your health care provider about:  The right schedule for you to have regular tests and exams.  Things you can do on your own to prevent diseases and keep yourself healthy. What should I know about diet, weight, and exercise? Eat a healthy diet  Eat a diet that includes plenty of vegetables, fruits, low-fat dairy products, and lean protein.  Do not eat a lot of foods that are high in solid fats, added sugars, or sodium.   Maintain a healthy weight Body mass index (BMI) is a measurement that can be used to identify possible weight problems. It estimates body fat based on height and weight. Your health care provider can help determine your BMI and help you achieve or maintain a healthy weight. Get regular exercise Get regular exercise. This is one of the most important things you can do for your health. Most adults should:  Exercise for at least 150 minutes each week. The exercise should increase your heart rate and make you sweat (moderate-intensity exercise).  Do strengthening exercises at least twice a week. This is in addition to the moderate-intensity exercise.  Spend less time sitting. Even light physical activity can be beneficial. Watch cholesterol and blood lipids Have your blood tested for lipids and cholesterol at 77 years of age, then have this test every 5 years. You may need to have your cholesterol levels checked more often if:  Your lipid or cholesterol levels are high.  You are older than 77 years of age.  You are at high risk for heart disease. What should I know about cancer screening? Many types of cancers can be detected early and may often be prevented. Depending on your health history and family history, you may need to  have cancer screening at various ages. This may include screening for:  Colorectal cancer.  Prostate cancer.  Skin cancer.  Lung cancer. What should I know about heart disease, diabetes, and high blood pressure? Blood pressure and heart disease  High blood pressure causes heart disease and increases the risk of stroke. This is more likely to develop in people who have high blood pressure readings, are of African descent, or are overweight.  Talk with your health care provider about your target blood pressure readings.  Have your blood pressure checked: ? Every 3-5 years if you are 18-39 years of age. ? Every year if you are 40 years old or older.  If you are between the ages of 65 and 75 and are a current or former smoker, ask your health care provider if you should have a one-time screening for abdominal aortic aneurysm (AAA). Diabetes Have regular diabetes screenings. This checks your fasting blood sugar level. Have the screening done:  Once every three years after age 45 if you are at a normal weight and have a low risk for diabetes.  More often and at a younger age if you are overweight or have a high risk for diabetes. What should I know about preventing infection? Hepatitis B If you have a higher risk for hepatitis B, you should be screened for this virus. Talk with your health care provider to find out if you are at risk for hepatitis B infection. Hepatitis C Blood testing is recommended   for:  Everyone born from 1945 through 1965.  Anyone with known risk factors for hepatitis C. Sexually transmitted infections (STIs)  You should be screened each year for STIs, including gonorrhea and chlamydia, if: ? You are sexually active and are younger than 77 years of age. ? You are older than 77 years of age and your health care provider tells you that you are at risk for this type of infection. ? Your sexual activity has changed since you were last screened, and you are at  increased risk for chlamydia or gonorrhea. Ask your health care provider if you are at risk.  Ask your health care provider about whether you are at high risk for HIV. Your health care provider may recommend a prescription medicine to help prevent HIV infection. If you choose to take medicine to prevent HIV, you should first get tested for HIV. You should then be tested every 3 months for as long as you are taking the medicine. Follow these instructions at home: Lifestyle  Do not use any products that contain nicotine or tobacco, such as cigarettes, e-cigarettes, and chewing tobacco. If you need help quitting, ask your health care provider.  Do not use street drugs.  Do not share needles.  Ask your health care provider for help if you need support or information about quitting drugs. Alcohol use  Do not drink alcohol if your health care provider tells you not to drink.  If you drink alcohol: ? Limit how much you have to 0-2 drinks a day. ? Be aware of how much alcohol is in your drink. In the [place] one drink equals one 12 oz bottle of beer (355 mL), one 5 oz glass of wine (148 mL), or one 1 oz glass of hard liquor (44 mL). General instructions  Schedule regular health, dental, and eye exams.  Stay current with your vaccines.  Tell your health care provider if: ? You often feel depressed. ? You have ever been abused or do not feel safe at home. Summary  Adopting a healthy lifestyle and getting preventive care are important in promoting health and wellness.  Follow your health care provider's instructions about healthy diet, exercising, and getting tested or screened for diseases.  Follow your health care provider's instructions on monitoring your cholesterol and blood pressure. This information is not intended to replace advice given to you by your health care provider. Make sure you discuss any questions you have with your health care provider. Document Revised: 12/19/2017  Document Reviewed: 12/19/2017 Elsevier Patient Education  2021 Elsevier Inc.  

## 2020-03-15 NOTE — Progress Notes (Signed)
Subjective:    Patient ID: Douglas Hammond., male    DOB: [birth year], 75 y.o.   MRN: [id number]  HPI He is here for a physical exam.   He is dealing with some fatigue- ? Related to bradycardia, parkinson's, keto, dehydration.  Some improvement with increased water.    Tremor w/o improvement with sinemet.  No change in gait.   BP at home variable.  106/60, 95/58  Medications and allergies reviewed with patient and updated if appropriate.  Patient Active Problem List   Diagnosis Date Noted  . Acute bronchitis 01/16/2020  . Parkinson's disease ([place]) 11/24/2019  . Bradycardia 05/22/2019  . Other fatigue 05/22/2019  . Elevated TSH 02/13/2018  . Colon polyps 01/24/2017  . Lightheadedness 09/19/2016  . 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 Right ankle swelling 07/21/2015  . PCO (posterior capsular opacification), right 08/06/2013  . Pseudophakia of both eyes 07/18/2013  . Dermatochalasis of both upper eyelids 05/14/2013  . Right carotid bruit 12/28/2012  . Nuclear cataract 03/28/2012  . 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 Testosterone deficiency 01/26/2009  . Essential hypertension 01/26/2009  . ELECTROCARDIOGRAM, ABNORMAL 01/26/2009  . SKIN CANCER, HX OF 01/26/2009  . ATRIAL SEPTAL DEFECT, HX OF 01/26/2009  . Hyperlipidemia 08/13/2006  . 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 Diverticulosis of large intestine 07/27/2006    Current Outpatient Medications on File Prior to Visit  Medication Sig Dispense Refill  . alclomethasone (ACLOVATE) 0.05 % cream Apply 1 application topically daily.     Marland Kitchen amLODipine (NORVASC) 5 MG tablet TAKE 1&1/2 TABLETS ONCE DAILY. 135 tablet 0  . carbidopa-levodopa (SINEMET IR) 25-100 MG tablet Take 1 tablet by mouth 3 (three) times daily. 90 tablet 0  . Multiple Vitamin (MULTIVITAMIN) tablet Take 1 tablet by mouth daily.    . Probiotic Product (PROBIOTIC FORMULA PO) Take 1 capsule by mouth daily. Align    . triamcinolone (KENALOG) 0.1 % SMARTSIG:1 Application  Topical 2-3 Times Daily     No current facility-administered medications on file prior to visit.    Past Medical History:  Diagnosis Date  . Bowel habit changes 06/2016   on Ketogenic diet/ does not go to the BR but every 3 days  . BPH (benign prostatic hyperplasia)   . Diverticulosis of colon 2008  . External hemorrhoids   . Gilbert's syndrome   . Heart murmur   . History of anesthesia complications    Per pt, gets very light-headed past sedation/ fainted 1 time!  . Hyperlipidemia   . Hypertension   . Skin cancer    X2 in [place]; now seeing Dr Georgia Duff    Past Surgical History:  Procedure Laterality Date  . ASD [place] EXTRACTION  July 2015   bilateral eyes; Dr Thyra Breed, [facility]  . COLONOSCOPY  2008   [facility]  . INGUINAL HERNIA REPAIR  2006  . [place]  . open heart   3980   75 year old  . TONSILLECTOMY     as child    Social History   Socioeconomic History  . Marital status: Married    Spouse name: Dorin Stooksbury  . Number of children: 0  . Years of education: BA  . Highest education level: Not on file  Occupational History  . Occupation: Retired  Tobacco Use  . Smoking status: Never Smoker  . Smokeless tobacco: Never Used  Vaping Use  .  Vaping Use: Never used  Substance and Sexual Activity  . Alcohol use: Yes    Comment: wine once a week  . Drug use: No  . Sexual activity: Yes  Other Topics Concern  . Not on file  Social History Narrative      Caffeine use: coffee daily   Right handed    Social Determinants of Health   Financial Resource Strain: Low Risk   . Difficulty of Paying Living Expenses: Not hard at all  Food Insecurity: No Food Insecurity  . Worried About Charity fundraiser in the Last Year: Never true  . Ran Out of Food in the Last Year: Never true  Transportation Needs: No Transportation Needs  . Lack of Transportation (Medical): No  . Lack of  Transportation (Non-Medical): No  Physical Activity: Sufficiently Active  . Days of Exercise per Week: 4 days  . Minutes of Exercise per Session: 120 min  Stress: No Stress Concern Present  . Feeling of Stress : Not at all  Social Connections: Unknown  . Frequency of Communication with Friends and Family: More than three times a week  . Frequency of Social Gatherings with Friends and Family: Once a week  . Attends Religious Services: Patient refused  . Active Member of Clubs or Organizations: Patient refused  . Attends Archivist Meetings: Patient refused  . Marital Status: Married    Family History  Problem Relation Age of Onset  . Hypertension Mother   . Other Mother        intestine blockage-burst  . Emphysema Father        smoker  . Cancer Neg Hx   . Diabetes Neg Hx   . Heart disease Neg Hx   . Stroke Neg Hx   . Colon cancer Neg Hx     Review of Systems  Constitutional: Positive for fatigue. Negative for chills and fever.  HENT: Positive for postnasal drip.   Eyes: Negative for visual disturbance.  Respiratory: Positive for cough. Negative for shortness of breath and wheezing.   Cardiovascular: Negative for chest pain, palpitations and leg swelling.  Gastrointestinal: Positive for constipation (miralax). Negative for abdominal pain, blood in stool, diarrhea and nausea.       Occ gerd  Genitourinary: Negative for difficulty urinating and dysuria.  Musculoskeletal: Positive for arthralgias and back pain.  Skin: Negative for color change and rash.  Neurological: Positive for light-headedness (occ - responds to inc water, carbs). Negative for dizziness and headaches.  Psychiatric/Behavioral: Negative for dysphoric mood. The patient is not nervous/anxious.        Objective:   Vitals:   03/16/20 1337  BP: 126/78  Pulse: 65  Temp: 98.2 F (36.8 C)  SpO2: 97%   Filed Weights   03/16/20 1337  Weight: 163 lb (73.9 kg)   Body mass index is 22.11  kg/m.  BP Readings from Last 3 Encounters:  03/16/20 126/78  02/11/20 136/82  11/24/19 138/75    Wt Readings from Last 3 Encounters:  03/16/20 163 lb (73.9 kg)  02/11/20 155 lb (70.3 kg)  11/24/19 159 lb (72.1 kg)     Physical Exam Constitutional: He appears well-developed and well-nourished. No distress. Resting tremor R arm. HENT:  Head: Normocephalic and atraumatic.  Right Ear: External ear normal.  Left Ear: External ear normal.  Mouth/Throat: Oropharynx is clear and moist.  Normal ear canals and TM b/l  Eyes: Conjunctivae and EOM are normal.  Neck: Neck supple. No tracheal deviation present.  No thyromegaly present.  No carotid bruit  Cardiovascular: Normal rate, regular rhythm, normal heart sounds and intact distal pulses.   No murmur heard. Pulmonary/Chest: Effort normal and breath sounds normal. No respiratory distress. He has no wheezes. He has no rales.  Abdominal: Soft. He exhibits no distension. There is no tenderness.  Genitourinary: deferred  Musculoskeletal: He exhibits no edema.  Lymphadenopathy:   He has no cervical adenopathy.  Skin: Skin is warm and dry. He is not diaphoretic.  Psychiatric: He has a normal mood and affect. His behavior is normal.         Assessment & Plan:   Physical exam: Screening blood work  ordered Immunizations  Up to date, discussed shingrix and tdap Colonoscopy   Due ?  - will ask GI Eye exams   Up to date  Exercise   Regular - pickeball 2 hrs a few times a week Weight  normal Substance abuse   none Sees derm annually  See Problem List for Assessment and Plan of chronic medical problems.   This visit occurred during the SARS-CoV-2 public health emergency.  Safety protocols were in place, including screening questions prior to the visit, additional usage of staff PPE, and extensive cleaning of exam room while observing appropriate contact time as indicated for disinfecting solutions.

## 2020-03-16 ENCOUNTER — Other Ambulatory Visit: Payer: Self-pay

## 2020-03-16 ENCOUNTER — Ambulatory Visit (INDEPENDENT_AMBULATORY_CARE_PROVIDER_SITE_OTHER): Payer: Medicare Other | Admitting: Internal Medicine

## 2020-03-16 ENCOUNTER — Encounter: Payer: Self-pay | Admitting: Internal Medicine

## 2020-03-16 VITALS — BP 126/78 | HR 65 | Temp 98.2°F | Ht 72.0 in | Wt 163.0 lb

## 2020-03-16 DIAGNOSIS — Z1211 Encounter for screening for malignant neoplasm of colon: Secondary | ICD-10-CM | POA: Diagnosis not present

## 2020-03-16 DIAGNOSIS — G20 Parkinson's disease: Secondary | ICD-10-CM | POA: Diagnosis not present

## 2020-03-16 DIAGNOSIS — Z0000 Encounter for general adult medical examination without abnormal findings: Secondary | ICD-10-CM

## 2020-03-16 DIAGNOSIS — I10 Essential (primary) hypertension: Secondary | ICD-10-CM

## 2020-03-16 NOTE — Assessment & Plan Note (Signed)
Chronic BP well controlled Continue amlodipine 7.5 mg daily Cmp, tsh, cbc

## 2020-03-16 NOTE — Assessment & Plan Note (Addendum)
Chronic Management per Dr Tat On sinemet - not much improvement in symptoms with current dose Exercising regularly

## 2020-03-25 ENCOUNTER — Other Ambulatory Visit: Payer: Self-pay

## 2020-03-25 ENCOUNTER — Other Ambulatory Visit (INDEPENDENT_AMBULATORY_CARE_PROVIDER_SITE_OTHER): Payer: Medicare Other

## 2020-03-25 DIAGNOSIS — I10 Essential (primary) hypertension: Secondary | ICD-10-CM

## 2020-03-25 DIAGNOSIS — Z0000 Encounter for general adult medical examination without abnormal findings: Secondary | ICD-10-CM

## 2020-03-25 DIAGNOSIS — R7989 Other specified abnormal findings of blood chemistry: Secondary | ICD-10-CM

## 2020-03-25 LAB — LIPID PANEL
Cholesterol: 161 mg/dL (ref 0–200)
HDL: 59.5 mg/dL (ref >39.00)
LDL Cholesterol: 91 mg/dL (ref 0–99)
NonHDL: 101.19
Total CHOL/HDL Ratio: 3
Triglycerides: 53 mg/dL (ref 0.0–149.0)
VLDL: 10.6 mg/dL (ref 0.0–40.0)

## 2020-03-25 LAB — CBC WITH DIFFERENTIAL/PLATELET
Basophils Absolute: 0 10*3/uL (ref 0.0–0.1)
Basophils Relative: 0.5 % (ref 0.0–3.0)
Eosinophils Absolute: 0.1 10*3/uL (ref 0.0–0.7)
Eosinophils Relative: 1.2 % (ref 0.0–5.0)
HCT: 39.3 % (ref 39.0–52.0)
Hemoglobin: 13.3 g/dL (ref 13.0–17.0)
Lymphocytes Relative: 30.6 % (ref 12.0–46.0)
Lymphs Abs: 1.7 10*3/uL (ref 0.7–4.0)
MCHC: 33.8 g/dL (ref 30.0–36.0)
MCV: 93.8 fl (ref 78.0–100.0)
Monocytes Absolute: 0.4 10*3/uL (ref 0.1–1.0)
Monocytes Relative: 8.1 % (ref 3.0–12.0)
Neutro Abs: 3.2 10*3/uL (ref 1.4–7.7)
Neutrophils Relative %: 59.6 % (ref 43.0–77.0)
Platelets: 209 10*3/uL (ref 150.0–400.0)
RBC: 4.18 Mil/uL — ABNORMAL LOW (ref 4.22–5.81)
RDW: 13.5 % (ref 11.5–15.5)
WBC: 5.4 10*3/uL (ref 4.0–10.5)

## 2020-03-25 LAB — COMPREHENSIVE METABOLIC PANEL
ALT: 5 U/L (ref 0–53)
AST: 20 U/L (ref 0–37)
Albumin: 4.1 g/dL (ref 3.5–5.2)
Alkaline Phosphatase: 58 U/L (ref 39–117)
BUN: 19 mg/dL (ref 6–23)
CO2: 30 mEq/L (ref 19–32)
Calcium: 9.2 mg/dL (ref 8.4–10.5)
Chloride: 100 mEq/L (ref 96–112)
Creatinine, Ser: 0.89 mg/dL (ref 0.40–1.50)
GFR: 83.41 mL/min (ref >60.00)
Glucose, Bld: 90 mg/dL (ref 70–99)
Potassium: 4.1 mEq/L (ref 3.5–5.1)
Sodium: 137 mEq/L (ref 135–145)
Total Bilirubin: 1.1 mg/dL (ref 0.2–1.2)
Total Protein: 7 g/dL (ref 6.0–8.3)

## 2020-03-25 LAB — TSH: TSH: 5.09 u[IU]/mL — ABNORMAL HIGH (ref 0.35–4.50)

## 2020-03-30 DIAGNOSIS — L853 Xerosis cutis: Secondary | ICD-10-CM | POA: Diagnosis not present

## 2020-03-30 DIAGNOSIS — Z85828 Personal history of other malignant neoplasm of skin: Secondary | ICD-10-CM | POA: Diagnosis not present

## 2020-03-30 DIAGNOSIS — L570 Actinic keratosis: Secondary | ICD-10-CM | POA: Diagnosis not present

## 2020-03-30 DIAGNOSIS — L821 Other seborrheic keratosis: Secondary | ICD-10-CM | POA: Diagnosis not present

## 2020-03-30 DIAGNOSIS — D1801 Hemangioma of skin and subcutaneous tissue: Secondary | ICD-10-CM | POA: Diagnosis not present

## 2020-03-30 DIAGNOSIS — C44519 Basal cell carcinoma of skin of other part of trunk: Secondary | ICD-10-CM | POA: Diagnosis not present

## 2020-03-30 DIAGNOSIS — L812 Freckles: Secondary | ICD-10-CM | POA: Diagnosis not present

## 2020-04-06 ENCOUNTER — Other Ambulatory Visit: Payer: Self-pay | Admitting: Neurology

## 2020-04-06 NOTE — Telephone Encounter (Signed)
Rx(s) sent to pharmacy electronically.  

## 2020-05-04 NOTE — Progress Notes (Signed)
Assessment/Plan:   1.  Parkinsons Disease  -Continue carbidopa/levodopa 25/100, 1 tablet 3 times per day  -he is doing great with exercise.  Congratulated him about that  -Wife asked several questions and I answered them to the best of my ability.  She had not been here previously.  -Discussed concept of levodopa resistant tremor.  His is really pretty mild.  -Discussed DBS surgeries and what is involved with those, although I do not really think he needs that right now.  2.  History of melanoma  -Patient follows with dermatology.  -Patient understands that Parkinson's also slightly increases risk for melanoma.  He just saw Dr. Ronnald Ramp a few weeks ago.  3.  Heart block  -pt previously thought that asymptommatic but now he is so lightheaded playing pickleball that I am not so sure that this is not related to heart block.  He increased the amount of carbohydrate in his keto diet and that did not necessarily seem to help.  He also tried to increase hydration and tried to increase electrolytes without success.  -Following with cardiology.  I encouraged patient to follow back up with cardiology, and I will send Dr. Lovena Le a copy of notes as well.   Subjective:   Douglas Johnston. was seen today in follow up for Parkinsons disease.  My previous records were reviewed prior to todays visit as well as outside records available to me. Pt with wife who supplements hx.  Pt denies falls.    No hallucinations.  Mood has been good.  Cardiology records reviewed since last visit.  Intermittent atrial fibrillation was identified on the patient since last visit.  However, when he saw Dr. Lovena Le, he did not think that it was atrial fibrillation, but felt tremor was interfering with the monitor.  He felt that he did have heart block that was asymptomatic and recommended avoiding AV nodal blocking drugs.  Doing pickleball 3 days per week for 2 hours each.  He does get lightheaded there.  BP not low.     Current prescribed movement disorder medications: Carbidopa/levodopa 25/100, 1 tablet at 7 AM/11 AM/4 PM   PREVIOUS MEDICATIONS:  propranolol 40 mg 3 times per day (bradycardia); primidone 100 mg twice daily (with Dr. Felecia Shelling); levodopa  ALLERGIES:   Allergies  Allergen Reactions  . Tessalon [Benzonatate] Other (See Comments)    Inc cough, wheeze, SOB    CURRENT MEDICATIONS:  Outpatient Encounter Medications as of 05/06/2020  Medication Sig  . alclomethasone (ACLOVATE) 0.05 % cream Apply 1 application topically daily.   Marland Kitchen amLODipine (NORVASC) 5 MG tablet TAKE 1&1/2 TABLETS ONCE DAILY.  . carbidopa-levodopa (SINEMET IR) 25-100 MG tablet TAKE ONE TABLET THREE TIMES DAILY  . Multiple Vitamin (MULTIVITAMIN) tablet Take 1 tablet by mouth daily.  . Probiotic Product (PROBIOTIC FORMULA PO) Take 1 capsule by mouth daily. Align  . triamcinolone (KENALOG) 0.1 % SMARTSIG:1 Application Topical 2-3 Times Daily   No facility-administered encounter medications on file as of 05/06/2020.    Objective:   PHYSICAL EXAMINATION:    VITALS:   Vitals:   05/06/20 1344  BP: 134/74  Pulse: 60  SpO2: 97%  Weight: 164 lb (74.4 kg)  Height: 5\' 11"  (1.803 m)    GEN:  The patient appears stated age and is in NAD. HEENT:  Normocephalic, atraumatic.  The mucous membranes are moist. The superficial temporal arteries are without ropiness or tenderness.   Neurological examination:  Orientation: The patient is alert and oriented  x3. Cranial nerves: There is good facial symmetry without facial hypomimia. The speech is fluent and clear. Soft palate rises symmetrically and there is no tongue deviation. Hearing is intact to conversational tone. Sensation: Sensation is intact to light touch throughout Motor: Strength is at least antigravity x4.  Movement examination: Tone: There is mild increased tone in the LUE Abnormal movements: there is mild LUE rest tremor Coordination:  There is  decremation with  RAM's, only with the action of "turning in a lightbulb" Gait and Station: The patient has no difficulty arising out of a deep-seated chair without the use of the hands. The patient's stride length is good.    I have reviewed and interpreted the following labs independently    Chemistry      Component Value Date/Time   NA 137 03/25/2020 0801   NA 141 04/03/2019 0924   K 4.1 03/25/2020 0801   CL 100 03/25/2020 0801   CO2 30 03/25/2020 0801   BUN 19 03/25/2020 0801   BUN 20 04/03/2019 0924   CREATININE 0.89 03/25/2020 0801      Component Value Date/Time   CALCIUM 9.2 03/25/2020 0801   ALKPHOS 58 03/25/2020 0801   AST 20 03/25/2020 0801   ALT 5 03/25/2020 0801   BILITOT 1.1 03/25/2020 0801       Lab Results  Component Value Date   WBC 5.4 03/25/2020   HGB 13.3 03/25/2020   HCT 39.3 03/25/2020   MCV 93.8 03/25/2020   PLT 209.0 03/25/2020    Lab Results  Component Value Date   TSH 5.09 (H) 03/25/2020     Total time spent on today's visit was 30 minutes, including both face-to-face time and nonface-to-face time.  Time included that spent on review of records (prior notes available to me/labs/imaging if pertinent), discussing treatment and goals, answering patient's questions and coordinating care.  Cc:  Binnie Rail, MD

## 2020-05-06 ENCOUNTER — Ambulatory Visit: Payer: Medicare Other | Admitting: Neurology

## 2020-05-06 ENCOUNTER — Encounter: Payer: Self-pay | Admitting: Neurology

## 2020-05-06 ENCOUNTER — Other Ambulatory Visit: Payer: Self-pay

## 2020-05-06 VITALS — BP 134/74 | HR 60 | Ht 71.0 in | Wt 164.0 lb

## 2020-05-06 DIAGNOSIS — G20 Parkinson's disease: Secondary | ICD-10-CM

## 2020-05-06 MED ORDER — CARBIDOPA-LEVODOPA 25-100 MG PO TABS: 1.0000 | ORAL_TABLET | Freq: Three times a day (TID) | ORAL | 1 refills | Status: DC

## 2020-05-06 NOTE — Patient Instructions (Signed)
Online Resources for Power over Parkinson's Group April 2022  . Local [place] Online Groups  o Power over Parkinson's Group :   - Power Over Parkinson's Patient Education Group will be Wednesday, April 13th at 2pm via Zoom.   - Upcoming Power over Parkinson's Meetings:  2nd Wednesdays of the month at 2 pm:  May 11th, June 8th - Contact Amy Marriott at [email or web address] if interested in participating in this online group o Parkinson's Care Partners Group:    3rd Mondays, Contact Misty Palladino o Atypical Parkinsonian Patient Group:   4th Wednesdays, Contact Misty Palladino o If you are interested in participating in these online groups with Misty, please contact her directly for how to join those meetings.  Her contact information is [email or web address]  She will send you a link to join the Zoom meeting.    . Parkinson Foundation:  www.parkinson.org o PD Health at Home continues:  Mindfulness Mondays, Expert Briefing Tuesdays, Wellness Wednesdays, Take Time Thursdays, Fitness Fridays -Listings for March 2022 are on the website o Upcoming Webinar:  Can We Put the Brakes on PD Progression?  Wednesday, April 6th @ 1 pm o Register for expert briefings (webinars) at [email or web address] o  Please check out their website to sign up for emails and see their full online offerings  . [employer] Foundation:  www.michaeljfox.org  o Upcoming Webinar:   New to Parkinson's?  Steps to Take Today.  Thursday, March 21st @ 12 noon o Check out additional information on their website to see their full online offerings  . [employer] Phinney Foundation:  www.davisphinneyfoundation.org o Upcoming Webinar:  Stay tuned o Care Partner Monthly Meetup.  With Connie Carpenter Phinney.  First Tuesday of each month, 2 pm o Check out additional information to Live Well Today on their website  . Parkinson and Movement Disorders (PMD) Alliance:  www.pmdalliance.org o [employer]:   Online Education Events o Sign up for emails, which are sent weekly to give you updates on programming and online offerings     . Parkinson's Association of [place]:  www.parkinsonassociation.org o Information on online support groups, education events, and online exercises including Yoga, Parkinson's exercises and more-LOTS of information on links to PD resources and online events o Virtual Support Group through Parkinson's Association of [place]; next one is scheduled for Wednesday, April 14, 2020 at 2 pm. (These are typically scheduled for the 1st Wednesday of the month at 2 pm).  Visit website for details.  . Additional links for movement activities: o PWR! Moves Classes at [facility] HAVE RESUMED!  Wednesdays 10 and 11 am.  Contact Amy Marriott, PT [email or web address] or [phone] if interested o Here is a link to the PWR!Moves classes on Zoom from [place] Parkinson's Foundation - Daily Mon-Sat at 10:00. Via Zoom, FREE and open to all.  There is also a link below via Facebook if you use that platform. - https://www.parkinsonsmi.org/mpf-programs/exercise-and-movement-activities - https://www.facebook.com/ParkinsonsMI.org/posts/pwr-moves-exercise-class-parkinson-wellness-recovery-online-with-angee-ludwa-pt-/10156827878021813/ o Parkinson's Wellness Recovery (PWR! Moves)  www.pwr4life.org - Info on the PWR! Virtual Experience:  You will have access to our expertise through self-assessment, guided plans that start with the PD-specific fundamentals, educational content, tips, Q&A with an expert, and a growing library of PD-specific pre-recorded and live exercise classes of varying types and intensity - both physical and cognitive! If that is not enough, we offer 1:1 wellness consultations (in-person or virtual) to personalize your PWR! Virtual Experience.  - Check out the PWR! Move of the month on the Parkinson Wellness Recovery website:     https://www.pwr4life.org/pwr-move-of-the-month-4/ o Parkinson Foundation Fitness Fridays:  - As part of the PD Health @ Home program, this free video series focuses each week on one aspect of fitness designed to support people living with Parkinson's.  These weekly videos highlight the Parkinson Foundation recent fitness guidelines for people with Parkinson's disease. -  www.parkinson.org/understanding-parkinsons/coronavirus/PD-health-at-home/Fitness-Fridays o Dance for PD website is offering free, live-stream classes throughout the week, as well as links to digital library of classes:  https://danceforparkinsons.org/ o Dance for Parkinson's Class:  [employer] [place].  Free offering for people with Parkinson's and care partners; virtual class.  o For more information, contact [phone] or email Magalli Morana at [email or web address] o Virtual dance and Pilates for Parkinson's classes: Click on the Community Tab> Parkinson's Movement Initiative Tab.  To register for classes and for more information, visit www.americandancefestival.org and click the "community" tab.     o [employer] Parkinson's Cycling Classes  - [facility] [employer]: 1pm on Fridays-Live classes at [employer] (Contact Beth McKinney at [email or web address] or [phone]) - [facility] [employer]: Virtual Classes Mondays and Thursdays (contact Priscilla at [email or web address] or [phone])  o [place] [employer]ady Boxing - Three levels of classes are offered Tuesdays and Thursdays:  10:30 am,  12 noon & 1:45 pm at [facility].  - Active Stretching with Maria, New Class starting in March, on Fridays - To observe a class or for  more information, call [phone] or email [email or web address] . [employer]: o Online Caregiver Education Opportunities:  www.well-springsolutions.org/caregiver-education/caregiver-support-group.  You may also contact Jodi Kolada at  [email or web address] or [phone].   o [facility] Navigator:  Just1Navigator program, a free service to help individuals and families through the journey of determining care for older adults.  The "Navigator" is a social worker, Nicole Reynolds, who will speak with a prospective client and/or loved ones to provide an assessment of the situation and a set of recommendations for a personalized care plan -- all free of charge, and whether [facility] [employer] offers the needed service or not. If the need is not a service we provide, we are well-connected with reputable programs in town that we can refer you to.  www.well-springsolutions.org or to speak with the Navigator, call [phone].  

## 2020-05-09 ENCOUNTER — Encounter: Payer: Self-pay | Admitting: Internal Medicine

## 2020-05-09 DIAGNOSIS — R5383 Other fatigue: Secondary | ICD-10-CM

## 2020-05-09 DIAGNOSIS — R7989 Other specified abnormal findings of blood chemistry: Secondary | ICD-10-CM

## 2020-05-11 DIAGNOSIS — R42 Dizziness and giddiness: Secondary | ICD-10-CM

## 2020-05-13 ENCOUNTER — Other Ambulatory Visit (INDEPENDENT_AMBULATORY_CARE_PROVIDER_SITE_OTHER): Payer: Medicare Other

## 2020-05-13 ENCOUNTER — Ambulatory Visit (INDEPENDENT_AMBULATORY_CARE_PROVIDER_SITE_OTHER): Payer: Medicare Other

## 2020-05-13 DIAGNOSIS — R5383 Other fatigue: Secondary | ICD-10-CM | POA: Diagnosis not present

## 2020-05-13 DIAGNOSIS — R42 Dizziness and giddiness: Secondary | ICD-10-CM

## 2020-05-13 DIAGNOSIS — R7989 Other specified abnormal findings of blood chemistry: Secondary | ICD-10-CM

## 2020-05-13 LAB — CBC WITH DIFFERENTIAL/PLATELET
Basophils Absolute: 0 10*3/uL (ref 0.0–0.1)
Basophils Relative: 0.4 % (ref 0.0–3.0)
Eosinophils Absolute: 0.1 10*3/uL (ref 0.0–0.7)
Eosinophils Relative: 1 % (ref 0.0–5.0)
HCT: 40.2 % (ref 39.0–52.0)
Hemoglobin: 13.5 g/dL (ref 13.0–17.0)
Lymphocytes Relative: 32.3 % (ref 12.0–46.0)
Lymphs Abs: 1.9 10*3/uL (ref 0.7–4.0)
MCHC: 33.7 g/dL (ref 30.0–36.0)
MCV: 93.9 fl (ref 78.0–100.0)
Monocytes Absolute: 0.5 10*3/uL (ref 0.1–1.0)
Monocytes Relative: 7.8 % (ref 3.0–12.0)
Neutro Abs: 3.5 10*3/uL (ref 1.4–7.7)
Neutrophils Relative %: 58.5 % (ref 43.0–77.0)
Platelets: 213 10*3/uL (ref 150.0–400.0)
RBC: 4.28 Mil/uL (ref 4.22–5.81)
RDW: 13.6 % (ref 11.5–15.5)
WBC: 5.9 10*3/uL (ref 4.0–10.5)

## 2020-05-13 LAB — COMPREHENSIVE METABOLIC PANEL
ALT: 6 U/L (ref 0–53)
AST: 23 U/L (ref 0–37)
Albumin: 4.4 g/dL (ref 3.5–5.2)
Alkaline Phosphatase: 57 U/L (ref 39–117)
BUN: 20 mg/dL (ref 6–23)
CO2: 29 mEq/L (ref 19–32)
Calcium: 9 mg/dL (ref 8.4–10.5)
Chloride: 100 mEq/L (ref 96–112)
Creatinine, Ser: 0.88 mg/dL (ref 0.40–1.50)
GFR: 83.61 mL/min (ref >60.00)
Glucose, Bld: 95 mg/dL (ref 70–99)
Potassium: 3.9 mEq/L (ref 3.5–5.1)
Sodium: 136 mEq/L (ref 135–145)
Total Bilirubin: 1 mg/dL (ref 0.2–1.2)
Total Protein: 7.4 g/dL (ref 6.0–8.3)

## 2020-05-13 LAB — TSH: TSH: 3.91 u[IU]/mL (ref 0.35–4.50)

## 2020-05-13 NOTE — Progress Notes (Unsigned)
Patient enrolled for [employer] to ship a 3 day ZIO XT long term holter monitor to his home. 

## 2020-05-13 NOTE — Telephone Encounter (Signed)
Called patient and verified address and educated about monitor. Will send instructions over mychart. Patient verbalized understanding

## 2020-05-22 ENCOUNTER — Other Ambulatory Visit: Payer: Self-pay | Admitting: Internal Medicine

## 2020-06-01 DIAGNOSIS — R42 Dizziness and giddiness: Secondary | ICD-10-CM | POA: Diagnosis not present

## 2020-07-09 ENCOUNTER — Other Ambulatory Visit: Payer: Self-pay | Admitting: Neurology

## 2020-08-02 ENCOUNTER — Encounter: Payer: Self-pay | Admitting: Internal Medicine

## 2020-08-11 ENCOUNTER — Other Ambulatory Visit: Payer: Self-pay | Admitting: Neurology

## 2020-08-23 NOTE — Progress Notes (Signed)
Cardiology Office Note   Date:  08/24/2020   ID:  Douglas Hammond., DOB [birth year], MRN [id number]  PCP:  Douglas Rail, MD  Cardiologist:   Douglas Rouge, MD   No chief complaint on file.     History of Present Illness: Douglas Johnston. is a 75 y.o. 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 Has been on BP meds for years. Tries to follow low sodium diet. Red wine seems to bring BP down. 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 Ineffective and started on norvasc.  He is active playing racquet ball 6 hours / week   Retired from [employer] from [place]  Married no children     Echo reviewed 07/19/16 EF 60-65% mild LVH Trivial MR/AR moderate LAE RV mildly dilated estimated PA 30 mmHg  Bubble study negative right to left shunt  Myovue 07/13/16 no ischemia or infarct EF estimated 50% normal by echo see above   Seen by primary 02/14/19 and has developed tremor in left hand/arm Norvasc changed to propanolol Tended to run low HR in 50's before this Since has been diagnosed with Parkinson's and started on Sinemet and Primidone  Seen by Douglas Johnston 11/11/19 and worried that HR was in 30-40 range Told to stop beta blocker  Monitor 06/04/20 with nocturnal 2:1 AV block average HR 66 bpm and PvCls Seen by Douglas Johnston and did not feel Symptomatic enough for PPM or ablation Did not at higher risk of syncope with vasodepressor response associated With Parkinson's Has some lightheadedness playing pickle ball and hydration and less keto diet has not helped  No cardiac complaints    Past Medical History:  Diagnosis Date   Bowel habit changes 06/2016   on Ketogenic diet/ does not go to the BR but every 3 days   BPH (benign prostatic hyperplasia)    Diverticulosis of colon 2008   External hemorrhoids    Gilbert's syndrome    Heart murmur    History of anesthesia complications     Per pt, gets very light-headed past sedation/ fainted 1 time!   Hyperlipidemia    Douglas Johnston    Skin cancer    X2 in [place]; now seeing Douglas Johnston    Past Surgical History:  Procedure Laterality Date   ASD [place] , [place]   CATARACT EXTRACTION  July 2015   bilateral eyes; Douglas Thyra Breed, [facility]   COLONOSCOPY  2008   [place]  2006   [place]   open heart   [id number]   75 year old   TONSILLECTOMY     as child     Current Outpatient Medications  Medication Sig Dispense Refill   alclomethasone (ACLOVATE) 0.05 % cream Apply 1 application topically daily.      amLODipine (NORVASC) 5 MG tablet TAKE 1&1/2 TABLETS ONCE DAILY. 135 tablet 0   carbidopa-levodopa (SINEMET IR) 25-100 MG tablet Take 1 tablet by mouth 3 (three) times daily. 90 tablet 0   Multiple Vitamin (MULTIVITAMIN) tablet Take 1 tablet by mouth daily.     Probiotic Product (PROBIOTIC FORMULA PO) Take 1 capsule by mouth daily. Align     triamcinolone (KENALOG) 0.1 % SMARTSIG:1 Application Topical 2-3 Times Daily     No  current facility-administered medications for this visit.    Allergies:   Tessalon [benzonatate]    Social History:  The patient  reports that he has never smoked. He has never used smokeless tobacco. He reports current alcohol use. He reports that he does not use drugs.   Family History:  The patient's family history includes Douglas Johnston in his father; Douglas Johnston in his mother; Douglas Johnston in his mother.    ROS:  Please see the history of present illness.   Otherwise, review of systems are positive for none.   All Douglas Johnston systems are reviewed and negative.    PHYSICAL EXAM: VS:  BP 122/80   Pulse 68   Ht 6' (1.829 m)   Wt 72.9 kg   SpO2 96%   BMI 21.81 kg/m  , BMI Body mass index is 21.81 kg/m. Affect appropriate Healthy:  appears stated age 1: normal Neck supple with no adenopathy JVP normal no bruits no  thyromegaly Lungs clear with no wheezing and good diaphragmatic motion Heart:  S1/S2 no murmur, no rub, gallop or click PMI normal Abdomen: benighn, BS positve, no tenderness, no AAA no bruit.  No HSM or HJR Distal pulses intact with no bruits No edema Neuro resting tremor in left hand and arm  Skin warm and dry No muscular weakness   EKG:  02/08/18 SR rate 59 LAE nonspecific ST changes 02/21/19 SR rate 55 lateral T  Wave changes  11/18/19 SR bigeminny tsyr 71 bpm   Recent Labs: 05/13/2020: ALT 6; BUN 20; Creatinine, Ser 0.88; Hemoglobin 13.5; Platelets 213.0; Potassium 3.9; Sodium 136; TSH 3.91    Lipid Panel    Component Value Date/Time   CHOL 161 03/25/2020 0801   CHOL 186 01/07/2014 1137   TRIG 53.0 03/25/2020 0801   TRIG 64 01/07/2014 1137   HDL 59.50 03/25/2020 0801   HDL 56 01/07/2014 1137   CHOLHDL 3 03/25/2020 0801   VLDL 10.6 03/25/2020 0801   LDLCALC 91 03/25/2020 0801   LDLCALC 117 (H) 01/07/2014 1137      Wt Readings from Last 3 Encounters:  08/24/20 72.9 kg  05/06/20 74.4 kg  03/16/20 73.9 kg      Douglas Johnston studies Reviewed: Additional studies/ records that were reviewed today include: Notes Douglas Quay Burow labs and ECG .Echo 07/19/16 Myovue 07/13/16     ASSESSMENT AND PLAN:  1. HTN  continue norvasc avoid beta blockers and Douglas Johnston AV nodal suppressers  2. ASD Repair intact by echo 07/07/16 negative bubble study  3. Varicosities:  F/u Douglas Oneida Alar no phlebitis  4.Bradycardia with nocturnal AVWB f/u Douglas Johnston defers PPM/Ablation at this time  5. Parkinson's: on  sinemet f/u neurology  Douglas Tat   F/U Douglas Johnston  F/U with me in a year   Douglas Johnston

## 2020-08-24 ENCOUNTER — Encounter: Payer: Self-pay | Admitting: Cardiovascular Disease

## 2020-08-24 ENCOUNTER — Encounter: Payer: Self-pay | Admitting: Internal Medicine

## 2020-08-24 ENCOUNTER — Other Ambulatory Visit: Payer: Self-pay

## 2020-08-24 ENCOUNTER — Ambulatory Visit: Payer: Medicare Other | Admitting: Cardiovascular Disease

## 2020-08-24 VITALS — BP 122/80 | HR 68 | Ht 72.0 in | Wt 160.8 lb

## 2020-08-24 DIAGNOSIS — I10 Essential (primary) hypertension: Secondary | ICD-10-CM

## 2020-08-24 DIAGNOSIS — G20 Parkinson's disease: Secondary | ICD-10-CM

## 2020-08-24 DIAGNOSIS — R001 Bradycardia, unspecified: Secondary | ICD-10-CM

## 2020-08-24 NOTE — Patient Instructions (Signed)

## 2020-08-25 ENCOUNTER — Other Ambulatory Visit: Payer: Self-pay | Admitting: Internal Medicine

## 2020-08-25 DIAGNOSIS — Z961 Presence of intraocular lens: Secondary | ICD-10-CM | POA: Diagnosis not present

## 2020-09-02 ENCOUNTER — Ambulatory Visit: Payer: Medicare Other | Admitting: Internal Medicine

## 2020-09-02 ENCOUNTER — Other Ambulatory Visit: Payer: Self-pay

## 2020-09-02 VITALS — BP 154/86 | HR 64 | Ht 72.0 in | Wt 162.4 lb

## 2020-09-02 DIAGNOSIS — R001 Bradycardia, unspecified: Secondary | ICD-10-CM | POA: Diagnosis not present

## 2020-09-02 DIAGNOSIS — I4891 Unspecified atrial fibrillation: Secondary | ICD-10-CM | POA: Diagnosis not present

## 2020-09-02 DIAGNOSIS — I459 Conduction disorder, unspecified: Secondary | ICD-10-CM

## 2020-09-02 DIAGNOSIS — I493 Ventricular premature depolarization: Secondary | ICD-10-CM | POA: Diagnosis not present

## 2020-09-02 NOTE — Progress Notes (Signed)
HPI Douglas Johnston returns today for ongoing evaluation of heart block. He is a pleasant 75 yo man with a h/o HTN, remote ASD repair, and Parkinsons. He also has PVC's. He wore a cardiac monitor demonstrating AVWB and periods of 2:1 AV block, mostly at night. He also has frequent PVC's including bigeminy.  He has not had syncope. He does have mild fatigue. He has lost 30 lbs but none recently.  He has not been exercising siting concerns about Covid.  Allergies  Allergen Reactions   Tessalon [Benzonatate] Other (See Comments)    Inc cough, wheeze, SOB     Current Outpatient Medications  Medication Sig Dispense Refill   alclomethasone (ACLOVATE) 0.05 % cream Apply 1 application topically daily.      amLODipine (NORVASC) 5 MG tablet TAKE 1&1/2 TABLETS ONCE DAILY. 135 tablet 0   carbidopa-levodopa (SINEMET IR) 25-100 MG tablet Take 1 tablet by mouth 3 (three) times daily. 90 tablet 0   Multiple Vitamin (MULTIVITAMIN) tablet Take 1 tablet by mouth daily.     Probiotic Product (PROBIOTIC FORMULA PO) Take 1 capsule by mouth daily. Align     triamcinolone (KENALOG) 0.1 % SMARTSIG:1 Application Topical 2-3 Times Daily     No current facility-administered medications for this visit.     Past Medical History:  Diagnosis Date   Bowel habit changes 06/2016   on Ketogenic diet/ does not go to the BR but every 3 days   BPH (benign prostatic hyperplasia)    Diverticulosis of colon 2008   External hemorrhoids    Gilbert's syndrome    Heart murmur    History of anesthesia complications    Per pt, gets very light-headed past sedation/ fainted 1 time!   Hyperlipidemia    Hypertension    Skin cancer    X2 in [place]; now seeing Douglas Johnston    ROS:   All systems reviewed and negative except as noted in the HPI.   Past Surgical History:  Procedure Laterality Date   ASD REPAIR  1961   [place] , [place]   CATARACT EXTRACTION  July 2015   bilateral eyes; Douglas Johnston, Douglas Johnston    COLONOSCOPY  2008   [facility]   INGUINAL HERNIA REPAIR  2006   [place]   open heart   [id number]   75 year old   TONSILLECTOMY     as child     Family History  Problem Relation Age of Onset   Hypertension Mother    Other Mother        intestine blockage-burst   Emphysema Father        smoker   Cancer Neg Hx    Diabetes Neg Hx    Heart disease Neg Hx    Stroke Neg Hx    Colon cancer Neg Hx      Social History   Socioeconomic History   Marital status: Married    Spouse name: Douglas Johnston   Number of children: 0   Years of education: BA   Highest education level: Not on file  Occupational History   Occupation: Retired  Tobacco Use   Smoking status: Never   Smokeless tobacco: Never  Vaping Use   Vaping Use: Never used  Substance and Sexual Activity   Alcohol use: Yes    Comment: wine once a week   Drug use: No   Sexual activity: Yes  Other Topics Concern  Not on file  Social History Narrative      Caffeine use: coffee daily   Right handed    Social Determinants of Health   Financial Resource Strain: Not on file  Food Insecurity: Not on file  Transportation Needs: Not on file  Physical Activity: Not on file  Stress: Not on file  Social Connections: Not on file  Intimate Partner Violence: Not on file     BP (!) 154/86   Pulse 64   Ht 6' (1.829 m)   Wt 162 lb 6.4 oz (73.7 kg)   SpO2 93%   BMI 22.03 kg/m   Physical Exam:  stable appearing NAD HEENT: Unremarkable Neck:  No JVD, no thyromegally Lymphatics:  No adenopathy Back:  No CVA tenderness Lungs:  Clear HEART:  Regular rate rhythm, no murmurs, no rubs, no clicks Abd:  soft, positive bowel sounds, no organomegally, no rebound, no guarding Ext:  2 plus pulses, no edema, no cyanosis, no clubbing Skin:  No rashes no nodules Neuro:  CN II through XII intact, motor grossly intact;tremor is present  Assess/Plan:  1. PVC's - he is not symptomatic in that he  does not have palpitations. We discussed the treatment. He is minimally if at all symptomatic.  2. 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 Again he is minimally if at all symptomatic. I gave him an idea of the symptoms he might experience and recommended watchful waiting and avoidance of AV nodal blocking drugs.  3. Parkinson's - this complicates things as we know that he is at risk for passing out from vasodepressor problems. He has not had syncope. I encouraged him to restart his exercise program.  4. Atrial fib - I have reviewed and am not convinced that he has atrial fib. He has a tremor and I think there is noise artifact on the monitor. I think he should undergo watchful waiting.    Carleene Overlie Deetra Booton,MD

## 2020-09-02 NOTE — Patient Instructions (Signed)
Medication Instructions:  Your physician recommends that you continue on your current medications as directed. Please refer to the Current Medication list given to you today.  *If you need a refill on your cardiac medications before your next appointment, please call your pharmacy*   Lab Work: None ordered.  If you have labs (blood work) drawn today and your tests are completely normal, you will receive your results only by: [place] (if you have MyChart) OR A paper copy in the mail If you have any lab test that is abnormal or we need to change your treatment, we will call you to review the results.   Testing/Procedures: None ordered.    Follow-Up: At [facility], you and your health needs are our priority.  As part of our continuing mission to provide you with exceptional heart care, we have created designated Provider Care Teams.  These Care Teams include your primary Cardiologist (physician) and Advanced Practice Providers (APPs -  Physician Assistants and Nurse Practitioners) who all work together to provide you with the care you need, when you need it.  We recommend signing up for the patient portal called "MyChart".  Sign up information is provided on this After Visit Summary.  MyChart is used to connect with patients for Virtual Visits (Telemedicine).  Patients are able to view lab/test results, encounter notes, upcoming appointments, etc.  Non-urgent messages can be sent to your provider as well.   To learn more about what you can do with MyChart, go to [email or web address].    Your next appointment:   12 months with Dr Lovena Le

## 2020-09-05 ENCOUNTER — Other Ambulatory Visit: Payer: Self-pay | Admitting: Neurology

## 2020-09-06 ENCOUNTER — Other Ambulatory Visit: Payer: Self-pay

## 2020-09-16 ENCOUNTER — Other Ambulatory Visit: Payer: Self-pay

## 2020-09-16 ENCOUNTER — Ambulatory Visit (AMBULATORY_SURGERY_CENTER): Payer: Medicare Other

## 2020-09-16 VITALS — Ht 72.0 in | Wt 160.0 lb

## 2020-09-16 DIAGNOSIS — Z86010 Personal history of colonic polyps: Secondary | ICD-10-CM

## 2020-09-16 MED ORDER — NA SULFATE-K SULFATE-MG SULF 17.5-3.13-1.6 GM/177ML PO SOLN: 1.0000 | Freq: Once | ORAL | 0 refills | Status: AC

## 2020-09-16 NOTE — Progress Notes (Signed)
Pre visit completed via phone call; patient verified name, DOB, and address; No egg or soy allergy known to patient  No issues with past sedation with any surgeries or procedures Patient denies ever being told they had issues or difficulty with intubation  No FH of Malignant Hyperthermia No diet pills per patient No home 02 use per patient  No blood thinners per patient  Pt denies issues with constipation  No A fib or A flutter  EMMI video via MyChart  COVID 19 guidelines implemented in PV today with Pt and RN   Pt is fully vaccinated for Covid x 2 + boosters;  NO PA's for preps discussed with pt in PV today  Discussed with pt there will be an out-of-pocket cost for prep and that varies from $0 to 70 +  dollars   Due to the COVID-19 pandemic we are asking patients to follow certain guidelines.  Pt aware of COVID protocols and [facility] guidelines

## 2020-09-22 ENCOUNTER — Encounter: Payer: Self-pay | Admitting: Internal Medicine

## 2020-09-28 ENCOUNTER — Emergency Department (HOSPITAL_BASED_OUTPATIENT_CLINIC_OR_DEPARTMENT_OTHER)
Admission: EM | Admit: 2020-09-28 | Discharge: 2020-09-28 | Disposition: A | Discharge status: Home / Self Care | Payer: Medicare Other | Attending: Emergency Medicine | Admitting: Emergency Medicine

## 2020-09-28 ENCOUNTER — Other Ambulatory Visit: Payer: Self-pay

## 2020-09-28 ENCOUNTER — Emergency Department (HOSPITAL_BASED_OUTPATIENT_CLINIC_OR_DEPARTMENT_OTHER): Payer: Medicare Other | Admitting: Radiology

## 2020-09-28 ENCOUNTER — Telehealth: Payer: Self-pay | Admitting: Internal Medicine

## 2020-09-28 ENCOUNTER — Encounter (HOSPITAL_BASED_OUTPATIENT_CLINIC_OR_DEPARTMENT_OTHER): Payer: Self-pay | Admitting: Emergency Medicine

## 2020-09-28 DIAGNOSIS — K5900 Constipation, unspecified: Secondary | ICD-10-CM | POA: Diagnosis not present

## 2020-09-28 DIAGNOSIS — Z85828 Personal history of other malignant neoplasm of skin: Secondary | ICD-10-CM | POA: Diagnosis not present

## 2020-09-28 DIAGNOSIS — G20 Parkinson's disease: Secondary | ICD-10-CM | POA: Diagnosis not present

## 2020-09-28 DIAGNOSIS — K5641 Fecal impaction: Secondary | ICD-10-CM | POA: Diagnosis not present

## 2020-09-28 DIAGNOSIS — Z79899 Other long term (current) drug therapy: Secondary | ICD-10-CM | POA: Insufficient documentation

## 2020-09-28 DIAGNOSIS — I10 Essential (primary) hypertension: Secondary | ICD-10-CM | POA: Insufficient documentation

## 2020-09-28 DIAGNOSIS — R251 Tremor, unspecified: Secondary | ICD-10-CM | POA: Diagnosis not present

## 2020-09-28 LAB — CBC WITH DIFFERENTIAL/PLATELET
Abs Immature Granulocytes: 0.03 10*3/uL (ref 0.00–0.07)
Basophils Absolute: 0 10*3/uL (ref 0.0–0.1)
Basophils Relative: 0 %
Eosinophils Absolute: 0 10*3/uL (ref 0.0–0.5)
Eosinophils Relative: 0 %
HCT: 39.1 % (ref 39.0–52.0)
Hemoglobin: 13.1 g/dL (ref 13.0–17.0)
Immature Granulocytes: 0 %
Lymphocytes Relative: 4 %
Lymphs Abs: 0.5 10*3/uL — ABNORMAL LOW (ref 0.7–4.0)
MCH: 31.6 pg (ref 26.0–34.0)
MCHC: 33.5 g/dL (ref 30.0–36.0)
MCV: 94.2 fL (ref 80.0–100.0)
Monocytes Absolute: 0.7 10*3/uL (ref 0.1–1.0)
Monocytes Relative: 6 %
Neutro Abs: 11.5 10*3/uL — ABNORMAL HIGH (ref 1.7–7.7)
Neutrophils Relative %: 90 %
Platelets: 191 10*3/uL (ref 150–400)
RBC: 4.15 MIL/uL — ABNORMAL LOW (ref 4.22–5.81)
RDW: 12.8 % (ref 11.5–15.5)
WBC: 12.8 10*3/uL — ABNORMAL HIGH (ref 4.0–10.5)
nRBC: 0 % (ref 0.0–0.2)

## 2020-09-28 LAB — COMPREHENSIVE METABOLIC PANEL
ALT: <5 U/L (ref 0–44)
AST: 18 U/L (ref 15–41)
Albumin: 4.4 g/dL (ref 3.5–5.0)
Alkaline Phosphatase: 48 U/L (ref 38–126)
Anion gap: 10 (ref 5–15)
BUN: 23 mg/dL (ref 8–23)
CO2: 24 mmol/L (ref 22–32)
Calcium: 9.3 mg/dL (ref 8.9–10.3)
Chloride: 100 mmol/L (ref 98–111)
Creatinine, Ser: 0.74 mg/dL (ref 0.61–1.24)
GFR, Estimated: >60 mL/min (ref >60)
Glucose, Bld: 110 mg/dL — ABNORMAL HIGH (ref 70–99)
Potassium: 3.9 mmol/L (ref 3.5–5.1)
Sodium: 134 mmol/L — ABNORMAL LOW (ref 135–145)
Total Bilirubin: 1.2 mg/dL (ref 0.3–1.2)
Total Protein: 7.2 g/dL (ref 6.5–8.1)

## 2020-09-28 MED ORDER — POLYETHYLENE GLYCOL 3350 17 G PO PACK
17.0000 g | PACK | Freq: Every day | ORAL | Status: DC
  Administered 2020-09-28: 17 g via ORAL
  Filled 2020-09-28: qty 1

## 2020-09-28 MED ORDER — LIDOCAINE HCL URETHRAL/MUCOSAL 2 % EX GEL
1.0000 "application " | Freq: Once | CUTANEOUS | Status: AC
  Administered 2020-09-28: 1 via TOPICAL
  Filled 2020-09-28: qty 11

## 2020-09-28 MED ORDER — FLEET ENEMA 7-19 GM/118ML RE ENEM
1.0000 | ENEMA | Freq: Once | RECTAL | Status: AC
  Administered 2020-09-28: 1 via RECTAL
  Filled 2020-09-28: qty 1

## 2020-09-28 NOTE — ED Provider Notes (Signed)
[facility] EMERGENCY DEPT Provider Note   CSN: [id number] Arrival date & time: 09/28/20  1418     History Chief Complaint  Patient presents with   Constipation    Douglas Johnston. is a 75 y.o. male.  Pt presents to the ED today with constipation.  Pt said he has not had a bowel movement in several days.  He feels like he needs to have a bm, but can't.  Now, he's just having liquid come out.  Pt denies any f/c.  No n/v.  He is supposed to be on miralax, but does not take it.      Past Medical History:  Diagnosis Date   Arthritis    LEFT shoulder   Bowel habit changes 06/2016   on Ketogenic diet/ does not go to the BR but every 3 days   BPH (benign prostatic hyperplasia)    Cataract 2015   bilateral sx   Diverticulosis of colon 2008   External hemorrhoids    GERD (gastroesophageal reflux disease)    with certain foods   Gilbert's syndrome    Heart murmur    History of anesthesia complications    Per pt, gets very light-headed past sedation/ fainted 1 time!   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   Right ankle swelling 07/21/2015   PCO (posterior capsular opacification), right 08/06/2013   Pseudophakia of both eyes 07/18/2013   Dermatochalasis of both upper eyelids 05/14/2013   Right carotid bruit 12/28/2012   Nuclear cataract 03/28/2012   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   Testosterone deficiency 01/26/2009   Essential hypertension 01/26/2009   ELECTROCARDIOGRAM, ABNORMAL 01/26/2009   SKIN CANCER, HX OF 01/26/2009   ATRIAL SEPTAL DEFECT, HX OF 01/26/2009   Hyperlipidemia 08/13/2006    GILBERT'S SYNDROME 08/13/2006   Diverticulosis of large intestine 07/27/2006    Past Surgical History:  Procedure Laterality Date   [place]- open heart surgery   CATARACT EXTRACTION Bilateral 07/2013   bilateral eyes; Dr Thyra Breed, [place]  2008   [facility]   COLONOSCOPY  2017   JMP-MAC-2 day suprep(good)-polyps   INGUINAL HERNIA REPAIR Left 2006   [place]     as child   WISDOM TOOTH EXTRACTION         Family History  Problem Relation Age of Onset   Hypertension Mother    Other Mother        intestine blockage-burst   Emphysema Father        smoker   Cancer Neg Hx    Diabetes Neg Hx    Heart disease Neg Hx    Stroke Neg Hx    Colon cancer Neg Hx    Colon polyps Neg Hx    Stomach cancer Neg Hx    Rectal cancer Neg Hx     Social History   Tobacco Use   Smoking status: Never  Smokeless tobacco: Never  Vaping Use   Vaping Use: Never used  Substance Use Topics   Alcohol use: Not Currently    Comment: special occasions   Drug use: No    Home Medications Prior to Admission medications   Medication Sig Start Date End Date Taking? Authorizing Provider  alclomethasone (ACLOVATE) 0.05 % cream Apply 1 application topically daily.     [provider]  amLODipine (NORVASC) 5 MG tablet TAKE 1&1/2 TABLETS ONCE DAILY. 08/25/20   Binnie Rail, MD  carbidopa-levodopa (SINEMET IR) 25-100 MG tablet Take 1 tablet by mouth 3 (three) times daily. 09/06/20   TatEustace Quail, DO  Multiple Vitamin (MULTIVITAMIN) tablet Take 1 tablet by mouth daily.    [provider]  Probiotic Product (PROBIOTIC FORMULA PO) Take 1 capsule by mouth daily. Align    [provider]  triamcinolone (KENALOG) 0.1 % SMARTSIG:1 Application Topical 2-3 Times Daily 01/20/20   [provider]    Allergies    Tessalon  [benzonatate]  Review of Systems   Review of Systems  Gastrointestinal:  Positive for constipation.  All other systems reviewed and are negative.  Physical Exam Updated Vital Signs BP (!) 147/91 (BP Location: Right Arm)   Pulse 70   Temp 99.1 F (37.3 C) (Oral)   Resp 18   Ht 6' (1.829 m)   Wt 72.6 kg   SpO2 98%   BMI 21.70 kg/m   Physical Exam Vitals and nursing note reviewed. Exam conducted with a chaperone present.  Constitutional:      Appearance: Normal appearance.  HENT:     Head: Normocephalic and atraumatic.     Right Ear: External ear normal.     Left Ear: External ear normal.     Nose: Nose normal.     Mouth/Throat:     Mouth: Mucous membranes are moist.     Pharynx: Oropharynx is clear.  Eyes:     Extraocular Movements: Extraocular movements intact.     Conjunctiva/sclera: Conjunctivae normal.     Pupils: Pupils are equal, round, and reactive to light.  Cardiovascular:     Rate and Rhythm: Normal rate and regular rhythm.     Pulses: Normal pulses.     Heart sounds: Normal heart sounds.  Pulmonary:     Effort: Pulmonary effort is normal.     Breath sounds: Normal breath sounds.  Abdominal:     General: Abdomen is flat. Bowel sounds are normal.     Palpations: Abdomen is soft.  Genitourinary:    Comments: Fecal impaction Musculoskeletal:        General: Normal range of motion.     Cervical back: Normal range of motion and neck supple.  Skin:    General: Skin is warm.     Capillary Refill: Capillary refill takes less than 2 seconds.  Neurological:     General: No focal deficit present.     Mental Status: He is alert and oriented to person, place, and time.     Comments: Tremors (hx parkinson's)  Psychiatric:        Mood and Affect: Mood normal.        Behavior: Behavior normal.    ED Results / Procedures / Treatments   Labs (all labs ordered are listed, but only abnormal results are displayed) Labs Reviewed  CBC WITH DIFFERENTIAL/PLATELET -  Abnormal; Notable for the following components:      Result Value   WBC 12.8 (*)    RBC  4.15 (*)    Neutro Abs 11.5 (*)    Lymphs Abs 0.5 (*)    All other components within normal limits  COMPREHENSIVE METABOLIC PANEL - Abnormal; Notable for the following components:   Sodium 134 (*)    Glucose, Bld 110 (*)    All other components within normal limits    EKG None  Radiology DG Abdomen 1 View  Result Date: 09/28/2020 CLINICAL DATA:  Constipation. EXAM: ABDOMEN - 1 VIEW COMPARISON:  None. FINDINGS: No abnormal bowel dilatation is noted. Moderate amount of stool seen throughout the colon. No radio-opaque calculi or other significant radiographic abnormality are seen. IMPRESSION: Moderate stool burden.  No abnormal bowel dilatation is noted. Electronically Signed   By: Marijo Conception M.D.   On: 09/28/2020 15:50    Procedures Fecal disimpaction  Date/Time: 09/28/2020 5:32 PM Performed by: Isla Pence, MD Authorized by: Isla Pence, MD  Consent: Verbal consent obtained. Local anesthesia used: no  Anesthesia: Local anesthesia used: no  Sedation: Patient sedated: no  Patient tolerance: patient tolerated the procedure well with no immediate complications Comments: Large amount of stool removed from rectal vault.     Medications Ordered in ED Medications  polyethylene glycol (MIRALAX / GLYCOLAX) packet 17 g (17 g Oral Given 09/28/20 1704)  sodium phosphate (FLEET) 7-19 GM/118ML enema 1 enema (1 enema Rectal Given 09/28/20 1705)  lidocaine (XYLOCAINE) 2 % jelly 1 application (1 application Topical Given 09/28/20 1705)    ED Course  I have reviewed the triage vital signs and the nursing notes.  Pertinent labs & imaging results that were available during my care of the patient were reviewed by me and considered in my medical decision making (see chart for details).    MDM Rules/Calculators/A&P                           Pt had a fecal disimpaction, then fleet enema.   He had excellent results and feels much better.  He is to take his miralax.  Return if worse.  F/u with pcp. Final Clinical Impression(s) / ED Diagnoses Final diagnoses:  Fecal impaction ([place])  Constipation, unspecified constipation type    Rx / DC Orders ED Discharge Orders     None        Isla Pence, MD 09/28/20 1733

## 2020-09-28 NOTE — Telephone Encounter (Signed)
Inbound call from pt requesting a call back that he has questions regarding his prep. Please advise. Thank you.

## 2020-09-28 NOTE — Discharge Instructions (Signed)
Take your miralax.

## 2020-09-28 NOTE — ED Notes (Signed)
ED Provider at bedside. 

## 2020-09-28 NOTE — Telephone Encounter (Signed)
He has a low temperature 94.3 per pt- 95 now- he is light headed - he has liquid coming out of his intestines- he feels like he has to poop and cant go-  dark brown liquid coming out- clammy- sweating- no bright red blood per pt-  he has no abdominal pain but has a high tolerance for pain -  Instructed pt to go to [facility] ED- to rule out GI bleed with s/s, dark liquid stool, light headed and dizzy  - wife and pt verbalized understanding

## 2020-09-28 NOTE — ED Notes (Signed)
Pt passed soft ball sized Stool after disimpaction by provider and Enema by this RN.  Pt states relief of all pain and discomfort.  Pt resting comfortably.

## 2020-09-28 NOTE — ED Triage Notes (Signed)
Pt arrives to ED with c/o of constipation. Pt reports no BM in the past five days. Reports mild amount of diarrhea that started today. No bright red blood per rectum. Reports anal pressure but no pain. Is scheduled for an colonoscopy this Friday. Has hx of Parkinson's disease. Denies abdominal pain. No dysuria.

## 2020-09-29 NOTE — Telephone Encounter (Signed)
Patient called stated he went to [facility] as advised and is back on track confirmed his procedure on Friday

## 2020-10-01 ENCOUNTER — Other Ambulatory Visit: Payer: Self-pay

## 2020-10-01 ENCOUNTER — Ambulatory Visit (AMBULATORY_SURGERY_CENTER): Payer: Medicare Other | Admitting: Internal Medicine

## 2020-10-01 ENCOUNTER — Encounter: Payer: Self-pay | Admitting: Internal Medicine

## 2020-10-01 VITALS — BP 123/72 | HR 60 | Temp 97.7°F | Resp 14 | Ht 72.0 in | Wt 160.0 lb

## 2020-10-01 DIAGNOSIS — I10 Essential (primary) hypertension: Secondary | ICD-10-CM | POA: Diagnosis not present

## 2020-10-01 DIAGNOSIS — D125 Benign neoplasm of sigmoid colon: Secondary | ICD-10-CM

## 2020-10-01 DIAGNOSIS — D122 Benign neoplasm of ascending colon: Secondary | ICD-10-CM | POA: Diagnosis not present

## 2020-10-01 DIAGNOSIS — K5900 Constipation, unspecified: Secondary | ICD-10-CM

## 2020-10-01 DIAGNOSIS — K626 Ulcer of anus and rectum: Secondary | ICD-10-CM

## 2020-10-01 DIAGNOSIS — G20 Parkinson's disease: Secondary | ICD-10-CM | POA: Diagnosis not present

## 2020-10-01 DIAGNOSIS — Z86010 Personal history of colonic polyps: Secondary | ICD-10-CM

## 2020-10-01 DIAGNOSIS — K5120 Ulcerative (chronic) proctitis without complications: Secondary | ICD-10-CM | POA: Diagnosis not present

## 2020-10-01 DIAGNOSIS — D123 Benign neoplasm of transverse colon: Secondary | ICD-10-CM

## 2020-10-01 DIAGNOSIS — K6389 Other specified diseases of intestine: Secondary | ICD-10-CM | POA: Diagnosis not present

## 2020-10-01 MED ORDER — SODIUM CHLORIDE 0.9 % IV SOLN: 500.0000 mL | Freq: Once | INTRAVENOUS | Status: DC

## 2020-10-01 NOTE — Progress Notes (Signed)
Called to room to assist during endoscopic procedure.  Patient ID and intended procedure confirmed with present staff. Received instructions for my participation in the procedure from the performing physician.  

## 2020-10-01 NOTE — Progress Notes (Signed)
Report to PACU, RN, vss, BBS= Clear.  

## 2020-10-01 NOTE — Patient Instructions (Signed)
Discharge instructions given. Handout on polyps. Miralax 17g daily. Resume previous medications. YOU HAD AN ENDOSCOPIC PROCEDURE TODAY AT [facility]:   Refer to the procedure report that was given to you for any specific questions about what was found during the examination.  If the procedure report does not answer your questions, please call your gastroenterologist to clarify.  If you requested that your care partner not be given the details of your procedure findings, then the procedure report has been included in a sealed envelope for you to review at your convenience later.  YOU SHOULD EXPECT: Some feelings of bloating in the abdomen. Passage of more gas than usual.  Walking can help get rid of the air that was put into your GI tract during the procedure and reduce the bloating. If you had a lower endoscopy (such as a colonoscopy or flexible sigmoidoscopy) you may notice spotting of blood in your stool or on the toilet paper. If you underwent a bowel prep for your procedure, you may not have a normal bowel movement for a few days.  Please Note:  You might notice some irritation and congestion in your nose or some drainage.  This is from the oxygen used during your procedure.  There is no need for concern and it should clear up in a day or so.  SYMPTOMS TO REPORT IMMEDIATELY:  Following lower endoscopy (colonoscopy or flexible sigmoidoscopy):  Excessive amounts of blood in the stool  Significant tenderness or worsening of abdominal pains  Swelling of the abdomen that is new, acute  Fever of 100F or higher   For urgent or emergent issues, a gastroenterologist can be reached at any hour by calling [phone]. Do not use MyChart messaging for urgent concerns.    DIET:  We do recommend a small meal at first, but then you may proceed to your regular diet.  Drink plenty of fluids but you should avoid alcoholic beverages for 24 hours.  ACTIVITY:  You should plan to take it  easy for the rest of today and you should NOT DRIVE or use heavy machinery until tomorrow (because of the sedation medicines used during the test).    FOLLOW UP: Our staff will call the number listed on your records 48-72 hours following your procedure to check on you and address any questions or concerns that you may have regarding the information given to you following your procedure. If we do not reach you, we will leave a message.  We will attempt to reach you two times.  During this call, we will ask if you have developed any symptoms of COVID 19. If you develop any symptoms (ie: fever, flu-like symptoms, shortness of breath, cough etc.) before then, please call [phone].  If you test positive for Covid 19 in the 2 weeks post procedure, please call and report this information to [place].    If any biopsies were taken you will be contacted by phone or by letter within the next 1-3 weeks.  Please call us at [phone] if you have not heard about the biopsies in 3 weeks.    SIGNATURES/CONFIDENTIALITY: You and/or your care partner have signed paperwork which will be entered into your electronic medical record.  These signatures attest to the fact that that the information above on your After Visit Summary has been reviewed and is understood.  Full responsibility of the confidentiality of this discharge information lies with you and/or your care-partner.

## 2020-10-01 NOTE — Progress Notes (Signed)
VS-CW  Pt's states no medical or surgical changes since previsit or office visit.  

## 2020-10-01 NOTE — Op Note (Signed)
[facility] Patient Name: Douglas Johnston Procedure Date: 10/01/2020 7:15 AM MRN: [id number] Endoscopist: Jerene Bears , MD Age: 75 Referring MD:  Date of Birth: [birth year] Gender: Male Account #: [id number] Procedure:                Colonoscopy Indications:              High risk colon cancer surveillance: Personal                            history of multiple adenomas, Last colonoscopy:                            January 2019 with 7 adenomas removed; incidental                            recent constipation with ER visit for fecal                            impaction Medicines:                Monitored Anesthesia Care Procedure:                Pre-Anesthesia Assessment:                           - Prior to the procedure, a History and Physical                            was performed, and patient medications and                            allergies were reviewed. The patient's tolerance of                            previous anesthesia was also reviewed. The risks                            and benefits of the procedure and the sedation                            options and risks were discussed with the patient.                            All questions were answered, and informed consent                            was obtained. Prior Anticoagulants: The patient has                            taken no previous anticoagulant or antiplatelet                            agents. ASA Grade Assessment: II - A patient with  mild systemic disease. After reviewing the risks                            and benefits, the patient was deemed in                            satisfactory condition to undergo the procedure.                           After obtaining informed consent, the colonoscope                            was passed under direct vision. Throughout the                            procedure, the patient's blood pressure, pulse, and                             oxygen saturations were monitored continuously. The                            Olympus PCF-H190DL ([id number]) Colonoscope was                            introduced through the anus and advanced to the                            cecum, identified by appendiceal orifice and                            ileocecal valve. The colonoscopy was performed                            without difficulty. The patient tolerated the                            procedure well. The quality of the bowel                            preparation was fair clearing to adequate with                            copious irrigation and lavage. The ileocecal valve,                            appendiceal orifice, and rectum were photographed. Scope In: 8:12:53 AM Scope Out: 8:39:45 AM Scope Withdrawal Time: 0 hours 22 minutes 16 seconds  Total Procedure Duration: 0 hours 26 minutes 52 seconds  Findings:                 The digital rectal exam was normal.                           A 7 mm polyp was found in the ascending colon. The  polyp was sessile. The polyp was removed with a                            cold snare. Resection and retrieval were complete.                           A 6 mm polyp was found in the transverse colon. The                            polyp was sessile. The polyp was removed with a                            cold snare. Resection and retrieval were complete.                           Two sessile polyps were found in the sigmoid colon.                            The polyps were 3 to 4 mm in size. These polyps                            were removed with a cold snare. Resection and                            retrieval were complete.                           Multiple small and large-mouthed diverticula were                            found in the sigmoid colon and distal descending                            colon.                           Ulcerations were  found in the distal rectum. Two                            ulcers seen with additional erosions just above the                            dentate line. No bleeding was present. Query                            stercoral ulcerations, though biopsies were taken                            with a cold forceps for histology to exclude                            dysplasia.  No additional abnormalities were found on                            retroflexion. Complications:            No immediate complications. Estimated Blood Loss:     Estimated blood loss was minimal. Impression:               - One 7 mm polyp in the ascending colon, removed                            with a cold snare. Resected and retrieved.                           - One 6 mm polyp in the transverse colon, removed                            with a cold snare. Resected and retrieved.                           - Two 3 to 4 mm polyps in the sigmoid colon,                            removed with a cold snare. Resected and retrieved.                           - Moderate diverticulosis in the sigmoid colon and                            in the distal descending colon.                           - Ulcerations in the distal rectum, query stercoral                            ulcerations given recent history of constipation                            and fecal impaction. Recommendation:           - Patient has a contact number available for                            emergencies. The signs and symptoms of potential                            delayed complications were discussed with the                            patient. Return to normal activities tomorrow.                            Written discharge instructions were provided to the  patient.                           - Resume previous diet.                           - Continue present medications.                           -  MiraLax 17 g daily. If constipation persists                            please contact my office for additional                            recommendations.                           - Await pathology results.                           - Repeat colonoscopy may be recommended for                            surveillance. The colonoscopy date will be                            determined after pathology results from today's                            exam become available for review. Jerene Bears, MD 10/01/2020 8:48:09 AM This report has been signed electronically.

## 2020-10-01 NOTE — Progress Notes (Signed)
GASTROENTEROLOGY PROCEDURE H&P NOTE   Primary Care Physician: Binnie Rail, MD    Reason for Procedure:  Surveillance of adenomatous colon polyps  Plan:    Colonoscopy  Patient is appropriate for endoscopic procedure(s) in the ambulatory ([place]) setting.  The nature of the procedure, as well as the risks, benefits, and alternatives were carefully and thoroughly reviewed with the patient. Ample time for discussion and questions allowed. The patient understood, was satisfied, and agreed to proceed.     HPI: Douglas Johnston. is a 75 y.o. male who presents for surveillance colonoscopy.  Medical history as below.  In the ER recently with obstipation constipation.  MiraLAX was recommended daily.  Today no chest pain, shortness of breath or abdominal pain.  Tolerated the prep.  Past Medical History:  Diagnosis Date   Arthritis    LEFT shoulder   Bowel habit changes 06/2016   on Ketogenic diet/ does not go to the BR but every 3 days   BPH (benign prostatic hyperplasia)    Cataract 2015   bilateral sx   Diverticulosis of colon 2008   External hemorrhoids    GERD (gastroesophageal reflux disease)    with certain foods   Gilbert's syndrome    Heart murmur    History of anesthesia complications    Per pt, gets very light-headed past sedation/ fainted 1 time!   Hyperlipidemia    on meds   Hypertension    on meds   Skin cancer    X2 in [place]; now seeing Dr Georgia Duff    Past Surgical History:  Procedure Laterality Date   [place]- open heart surgery   CATARACT EXTRACTION Bilateral 07/2013   bilateral eyes; Dr Thyra Breed, [place]  2008   [facility]   COLONOSCOPY  2017   JMP-MAC-2 day suprep(good)-polyps   INGUINAL HERNIA REPAIR Left 2006   [place]     as child   WISDOM TOOTH EXTRACTION      Prior to Admission medications    Medication Sig Start Date End Date Taking? Authorizing Provider  alclomethasone (ACLOVATE) 0.05 % cream Apply 1 application topically daily.    Yes [provider]  amLODipine (NORVASC) 5 MG tablet TAKE 1&1/2 TABLETS ONCE DAILY. 08/25/20  Yes Burns, Claudina Lick, MD  carbidopa-levodopa (SINEMET IR) 25-100 MG tablet Take 1 tablet by mouth 3 (three) times daily. 09/06/20  Yes Tat, Eustace Quail, DO  Multiple Vitamin (MULTIVITAMIN) tablet Take 1 tablet by mouth daily.   Yes [provider]  Probiotic Product (PROBIOTIC FORMULA PO) Take 1 capsule by mouth daily. Align   Yes [provider]  triamcinolone (KENALOG) 0.1 % SMARTSIG:1 Application Topical 2-3 Times Daily 01/20/20  Yes [provider]    Current Outpatient Medications  Medication Sig Dispense Refill   alclomethasone (ACLOVATE) 0.05 % cream Apply 1 application topically daily.      amLODipine (NORVASC) 5 MG tablet TAKE 1&1/2 TABLETS ONCE DAILY. 135 tablet 0   carbidopa-levodopa (SINEMET IR) 25-100 MG tablet Take 1 tablet by mouth 3 (three) times daily. 90 tablet 0   Multiple Vitamin (MULTIVITAMIN) tablet Take 1 tablet by mouth daily.     Probiotic Product (PROBIOTIC FORMULA PO) Take 1 capsule by mouth daily. Align     triamcinolone (KENALOG) 0.1 % SMARTSIG:1 Application Topical 2-3  Times Daily     Current Facility-Administered Medications  Medication Dose Route Frequency Provider Last Rate Last Admin   0.9 %  sodium chloride infusion  500 mL Intravenous Once Nina Hoar, Lajuan Lines, MD        Allergies as of 10/01/2020 - Review Complete 10/01/2020  Allergen Reaction Noted   Tessalon [benzonatate] Other (See Comments) 01/16/2020    Family History  Problem Relation Age of Onset   Hypertension Mother    Other Mother        intestine blockage-burst   Emphysema Father        smoker   Cancer Neg Hx    Diabetes Neg Hx    Heart disease Neg Hx    Stroke Neg Hx    Colon cancer Neg Hx    Colon polyps Neg Hx     Stomach cancer Neg Hx    Rectal cancer Neg Hx     Social History   Socioeconomic History   Marital status: Married    Spouse name: Donyae Kilner   Number of children: 0   Years of education: BA   Highest education level: Not on file  Occupational History   Occupation: Retired  Tobacco Use   Smoking status: Never   Smokeless tobacco: Never  Vaping Use   Vaping Use: Never used  Substance and Sexual Activity   Alcohol use: Not Currently    Comment: special occasions   Drug use: No   Sexual activity: Yes  Other Topics Concern   Not on file  Social History Narrative      Caffeine use: coffee daily   Right handed    Social Determinants of Health   Financial Resource Strain: Not on file  Food Insecurity: Not on file  Transportation Needs: Not on file  Physical Activity: Not on file  Stress: Not on file  Social Connections: Not on file  Intimate Partner Violence: Not on file    Physical Exam: Vital signs in last 24 hours: _0  (!) 145/60   Pulse 64   Temp 97.7 F (36.5 C) (Temporal)   Resp 10   Ht 6' (1.829 m)   Wt 160 lb (72.6 kg)   SpO2 99%   BMI 21.70 kg/m  GEN: NAD EYE: Sclerae anicteric ENT: MMM CV: Non-tachycardic Pulm: CTA b/l GI: Soft, NT/ND NEURO:  Alert & Oriented x 3   Zenovia Jarred, MD [facility]  10/01/2020 8:09 AM

## 2020-10-05 ENCOUNTER — Telehealth: Payer: Self-pay

## 2020-10-05 NOTE — Telephone Encounter (Signed)
  Follow up Call-  Call back number 10/01/2020  Post procedure Call Back phone  # [phone]  Permission to leave phone message Yes  Some recent data might be hidden     Patient questions:  Do you have a fever, pain , or abdominal swelling? No. Pain Score  0 *  Have you tolerated food without any problems? Yes.    Have you been able to return to your normal activities? Yes.    Do you have any questions about your discharge instructions: Diet   No. Medications  No. Follow up visit  No.  Do you have questions or concerns about your Care? No.  Actions: * If pain score is 4 or above: No action needed, pain <4.

## 2020-10-06 ENCOUNTER — Encounter: Payer: Self-pay | Admitting: Internal Medicine

## 2020-10-08 ENCOUNTER — Other Ambulatory Visit: Payer: Self-pay | Admitting: Neurology

## 2020-10-08 DIAGNOSIS — G20 Parkinson's disease: Secondary | ICD-10-CM

## 2020-10-11 ENCOUNTER — Other Ambulatory Visit: Payer: Self-pay

## 2020-10-14 DIAGNOSIS — L821 Other seborrheic keratosis: Secondary | ICD-10-CM | POA: Diagnosis not present

## 2020-10-14 DIAGNOSIS — L812 Freckles: Secondary | ICD-10-CM | POA: Diagnosis not present

## 2020-10-14 DIAGNOSIS — Z85828 Personal history of other malignant neoplasm of skin: Secondary | ICD-10-CM | POA: Diagnosis not present

## 2020-10-14 DIAGNOSIS — C44311 Basal cell carcinoma of skin of nose: Secondary | ICD-10-CM | POA: Diagnosis not present

## 2020-10-14 DIAGNOSIS — D1801 Hemangioma of skin and subcutaneous tissue: Secondary | ICD-10-CM | POA: Diagnosis not present

## 2020-10-14 DIAGNOSIS — L570 Actinic keratosis: Secondary | ICD-10-CM | POA: Diagnosis not present

## 2020-10-15 ENCOUNTER — Other Ambulatory Visit: Payer: Self-pay

## 2020-10-15 ENCOUNTER — Ambulatory Visit (INDEPENDENT_AMBULATORY_CARE_PROVIDER_SITE_OTHER): Payer: Medicare Other

## 2020-10-15 DIAGNOSIS — Z23 Encounter for immunization: Secondary | ICD-10-CM | POA: Diagnosis not present

## 2020-10-15 NOTE — Progress Notes (Signed)
High Dose flu vacc given w/o any complications.

## 2020-10-18 ENCOUNTER — Telehealth: Payer: Self-pay | Admitting: Internal Medicine

## 2020-10-18 NOTE — Telephone Encounter (Signed)
I would recommend we try him on Amitiza 8 mcg twice daily He should let me know if this is ineffective or if it is causing side effects such as abdominal pain, diarrhea or bloating

## 2020-10-18 NOTE — Telephone Encounter (Signed)
Pt had colon about 2 1/2 weeks go.  Still experiencing constipation.  Dr. Rockey Situ pt to call back if Miralax wasn't working for him and he'd prescribe something else.

## 2020-10-18 NOTE — Telephone Encounter (Signed)
Pt states he took the miralax daily and it gave him diarrhea. He stopped taking it and then started it back and reports it took 4 days before it did anything for him. Pt wants to know what else Dr. Hilarie Fredrickson can recommend for his constipation. States the miralax is not working for him. Please advise.

## 2020-10-19 ENCOUNTER — Ambulatory Visit (INDEPENDENT_AMBULATORY_CARE_PROVIDER_SITE_OTHER): Payer: Medicare Other

## 2020-10-19 DIAGNOSIS — Z0000 Encounter for general adult medical examination without abnormal findings: Secondary | ICD-10-CM | POA: Diagnosis not present

## 2020-10-19 NOTE — Progress Notes (Signed)
I connected with Margot Ables today by telephone and verified that I am speaking with the correct person using two identifiers. Location patient: home Location provider: work Persons participating in the virtual visit: patient, provider.   I discussed the limitations, risks, security and privacy concerns of performing an evaluation and management service by telephone and the availability of in person appointments. I also discussed with the patient that there may be a patient responsible charge related to this service. The patient expressed understanding and verbally consented to this telephonic visit.    Interactive audio and video telecommunications were attempted between this provider and patient, however failed, due to patient having technical difficulties OR patient did not have access to video capability.  We continued and completed visit with audio only.  Some vital signs may be absent or patient reported.   Time Spent with patient on telephone encounter: 40 minutes  Subjective:   Douglas Johnston. is a 75 y.o. male who presents for Medicare Annual/Subsequent preventive examination.  Review of Systems     Cardiac Risk Factors include: advanced age (>68mn, >>69women);dyslipidemia;family history of premature cardiovascular disease;hypertension;male gender     Objective:    Today's Vitals   10/19/20 1441  PainSc: 0-No pain   There is no height or weight on file to calculate BMI.  Advanced Directives 10/19/2020 09/28/2020 05/06/2020 11/24/2019 07/08/2019 02/08/2018 09/09/2015  Does Patient Have a Medical Advance Directive? _0  Yes Yes  Type of Advance Directive Living will;Healthcare Power of [place];Out of facility DNR (pink MOST or yellow form) [place] will Living will;Healthcare Power of Attorney  Does  patient want to make changes to medical advance directive? No - Patient declined - - - No - Patient declined - No - Patient declined  Copy of [place] Chart? No - copy requested - - - No - copy requested No - copy requested No - copy requested    Current Medications (verified) Outpatient Encounter Medications as of 10/19/2020  Medication Sig   alclomethasone (ACLOVATE) 0.05 % cream Apply 1 application topically daily.    amLODipine (NORVASC) 5 MG tablet TAKE 1&1/2 TABLETS ONCE DAILY.   carbidopa-levodopa (SINEMET IR) 25-100 MG tablet TAKE ONE TABLET BY MOUTH THREE TIMES DAILY   Multiple Vitamin (MULTIVITAMIN) tablet Take 1 tablet by mouth daily.   Probiotic Product (PROBIOTIC FORMULA PO) Take 1 capsule by mouth daily. Align   triamcinolone (KENALOG) 0.1 % SMARTSIG:1 Application Topical 2-3 Times Daily   No facility-administered encounter medications on file as of 10/19/2020.    Allergies (verified) Tessalon [benzonatate]   History: Past Medical History:  Diagnosis Date   Arthritis    LEFT shoulder   Bowel habit changes 06/2016   on Ketogenic diet/ does not go to the BR but every 3 days   BPH (benign prostatic hyperplasia)    Cataract 2015   bilateral sx   Diverticulosis of colon 2008   External hemorrhoids    GERD (gastroesophageal reflux disease)    with certain foods   Gilbert's syndrome    Heart murmur    History of anesthesia complications    Per pt, gets very light-headed past sedation/ fainted 1 time!   Hyperlipidemia    on meds   Hypertension    on meds   [place]uff  Past Surgical History:  Procedure Laterality Date   [place]- open heart surgery   CATARACT EXTRACTION Bilateral 07/2013   bilateral eyes; Dr Thyra Breed, [place]  2008   [facility]   COLONOSCOPY  2017   [facility]-MAC-2 day suprep(good)-polyps    INGUINAL HERNIA REPAIR Left 2006   [place]     as child   WISDOM TOOTH EXTRACTION     Family History  Problem Relation Age of Onset   Hypertension Mother    Other Mother        intestine blockage-burst   Emphysema Father        smoker   Cancer Neg Hx    Diabetes Neg Hx    Heart disease Neg Hx    Stroke Neg Hx    Colon cancer Neg Hx    Colon polyps Neg Hx    Stomach cancer Neg Hx    Rectal cancer Neg Hx    Social History   Socioeconomic History   Marital status: Married    Spouse name: Douglas Johnston   Number of children: 0   Years of education: BA   Highest education level: Not on file  Occupational History   Occupation: Retired  Tobacco Use   Smoking status: Never   Smokeless tobacco: Never  Vaping Use   Vaping Use: Never used  Substance and Sexual Activity   Alcohol use: Not Currently    Comment: special occasions   Drug use: No   Sexual activity: Yes  Other Topics Concern   Not on file  Social History Narrative      Caffeine use: coffee daily   Right handed    Social Determinants of Health   Financial Resource Strain: Low Risk    Difficulty of Paying Living Expenses: Not hard at all  Food Insecurity: No Food Insecurity   Worried About Charity fundraiser in the Last Year: Never true   Ran Out of Food in the Last Year: Never true  Transportation Needs: No Transportation Needs   Lack of Transportation (Medical): No   Lack of Transportation (Non-Medical): No  Physical Activity: Sufficiently Active   Days of Exercise per Week: 5 days   Minutes of Exercise per Session: 120 min  Stress: No Stress Concern Present   Feeling of Stress : Not at all  Social Connections: Socially Integrated   Frequency of Communication with Friends and Family: More than three times a week   Frequency of Social Gatherings with Friends and Family: More than three times a week   Attends Religious Services: More than 4 times per  year   Active Member of [employer] or Organizations: Yes   Attends Music therapist: More than 4 times per year   Marital Status: Married    Tobacco Counseling Counseling given: Not Answered   Clinical Intake:  Pre-visit preparation completed: Yes  Pain : No/denies pain Pain Score: 0-No pain     Nutritional Risks: None  How often do you need to have someone help you when you read instructions, pamphlets, or other written materials from your doctor or pharmacy?: 1 - Never What is the last grade level you completed in school?: Bachelor's Degree  Diabetic? no  Interpreter Needed?: No  Information entered by :: Lisette Abu, LPN   Activities of Daily Living In  your present state of health, do you have any difficulty performing the following activities: 10/19/2020  Hearing? N  Vision? N  Difficulty concentrating or making decisions? N  Walking or climbing stairs? N  Dressing or bathing? N  Doing errands, shopping? N  Preparing Food and eating ? N  Using the Toilet? N  In the past six months, have you accidently leaked urine? N  Do you have problems with loss of bowel control? N  Managing your Medications? N  Managing your Finances? N  Housekeeping or managing your Housekeeping? N  Some recent data might be hidden    Patient Care Team: Binnie Rail, MD as PCP - General (Internal Medicine)  Indicate any recent Medical Services you may have received from other than [facility] providers in the past year (date may be approximate).     Assessment:   This is a routine wellness examination for [place].  Hearing/Vision screen Hearing Screening - Comments:: Patient has hearing difficulty.   No hearing aids.  Vision Screening - Comments:: Patient does not wear any corrective glasses/contacts.  Eye exam done annually by: [place] ([place])  Dietary issues and exercise activities discussed: Current Exercise Habits:  Structured exercise class, Type of exercise: walking;Other - see comments (pickle ball 3 x week for 2 hours; walking, gym 2 x week), Time (Minutes): 60, Frequency (Times/Week): 5, Weekly Exercise (Minutes/Week): 300, Intensity: Moderate, Exercise limited by: neurologic condition(s)   Goals Addressed               This Visit's Progress     Patient Stated (pt-stated)        My goal for the next year is to continue to be physically active and decrease the effects of Parkinson's.      Depression Screen PHQ 2/9 Scores 10/19/2020 07/08/2019 02/14/2019 02/08/2018 10/30/2017 10/10/2016 10/01/2015  PHQ - 2 Score 0 0 0 0 0 0 0  PHQ- 9 Score - - - - 0 - -    Fall Risk Fall Risk  10/19/2020 05/06/2020 11/24/2019 07/08/2019 02/14/2019  Falls in the past year? 0 0 0 0 0  Number falls in past yr: 0 0 0 0 0  Injury with Fall? 0 0 0 0 -  Risk for fall due to : No Fall Risks - - No Fall Risks -  Follow up Falls evaluation completed - - Falls evaluation completed -    FALL RISK PREVENTION PERTAINING TO THE HOME:  Any stairs in or around the home? Yes  If so, are there any without handrails? No  Home free of loose throw rugs in walkways, pet beds, electrical cords, etc? Yes  Adequate lighting in your home to reduce risk of falls? Yes   ASSISTIVE DEVICES UTILIZED TO PREVENT FALLS:  Life alert? No  Use of a cane, walker or w/c? No  Grab bars in the bathroom? No  Shower chair or bench in shower? Yes  Elevated toilet seat or a handicapped toilet? Yes   TIMED UP AND GO:  Was the test performed? No .  Length of time to ambulate 10 feet: n/a sec.   Gait steady and fast without use of assistive device  Cognitive Function: Normal cognitive status assessed by direct observation by this Nurse Health Advisor. No abnormalities found.          Immunizations Immunization History  Administered Date(s) Administered   Fluad Quad(high Dose 65+) 10/10/2018, 10/10/2019, 10/15/2020   Influenza, High Dose  Seasonal PF 10/16/2012, 10/01/2015, 10/10/2016, 10/30/2017   Influenza,inj,Quad PF,6+ Mos 11/28/2013   Influenza-Unspecified 11/02/2014   PFIZER(Purple Top)SARS-COV-2 Vaccination 02/13/2019, 03/11/2019, 10/23/2019, 04/20/2020, 10/04/2020   Pneumococcal Conjugate-13 12/25/2014   Pneumococcal Polysaccharide-23 12/08/2011   Td 02/14/2010   Zoster, Live 01/07/2014    TDAP status: Due, Education has been provided regarding the importance of this vaccine. Advised may receive this vaccine at local pharmacy or Health Dept. Aware to provide a copy of the vaccination record if obtained from local pharmacy or Health Dept. Verbalized acceptance and understanding.  Flu Vaccine status: Up to date  Pneumococcal vaccine status: Up to date  Covid-19 vaccine status: Completed vaccines  Qualifies for Shingles Vaccine? Yes   Zostavax completed Yes   Shingrix Completed?: No.    Education has been provided regarding the importance of this vaccine. Patient has been advised to call insurance company to determine out of pocket expense if they have not yet received this vaccine. Advised may also receive vaccine at local pharmacy or Health Dept. Verbalized acceptance and understanding.  Screening Tests Health Maintenance  Topic Date Due   Zoster Vaccines- Shingrix (1 of 2) Never done   TETANUS/TDAP  03/16/2021 (Originally 02/15/2020)   INFLUENZA VACCINE  Completed   COVID-19 Vaccine  Completed   Hepatitis C Screening  Completed   HPV VACCINES  Aged Out    Health Maintenance  Health Maintenance Due  Topic Date Due   Zoster Vaccines- Shingrix (1 of 2) Never done    Colorectal cancer screening: Type of screening: Colonoscopy. Completed 10/01/2020. Repeat every 0 years  Lung Cancer Screening: (Low Dose CT Chest recommended if Age 40-80 years, 30 pack-year currently smoking OR have quit w/in 15years.) does not qualify.   Lung Cancer Screening Referral: no   Additional Screening:  Hepatitis C Screening:  does qualify; Completed yes  Vision Screening: Recommended annual ophthalmology exams for early detection of glaucoma and other disorders of the eye. Is the patient up to date with their annual eye exam?  Yes  Who is the provider or what is the name of the office in which the patient attends annual eye exams? [place] If pt is not established with a provider, would they like to be referred to a provider to establish care? No .   Dental Screening: Recommended annual dental exams for proper oral hygiene  Community Resource Referral / Chronic Care Management: CRR required this visit?  No   CCM required this visit?  No      Plan:     I have personally reviewed and noted the following in the patient's chart:   Medical and social history Use of alcohol, tobacco or illicit drugs  Current medications and supplements including opioid prescriptions. Patient is not currently taking opioid prescriptions. Functional ability and status Nutritional status Physical activity Advanced directives List of other physicians Hospitalizations, surgeries, and ER visits in previous 12 months Vitals Screenings to include cognitive, depression, and falls Referrals and appointments  In addition, I have reviewed and discussed with patient certain preventive protocols, quality metrics, and best practice recommendations. A written personalized care plan for preventive services as well as general preventive health recommendations were provided to patient.     Sheral Flow, LPN   [id number]   Nurse Notes: Patient is cogitatively intact. There were no vitals filed for this visit. There is no height or weight on file to calculate BMI.

## 2020-10-19 NOTE — Patient Instructions (Signed)
Douglas Johnston , Thank you for taking time to come for your Medicare Wellness Visit. I appreciate your ongoing commitment to your health goals. Please review the following plan we discussed and let me know if I can assist you in the future.   Screening recommendations/referrals: Colonoscopy: last done 10/01/2020; no repeat due to age Recommended yearly ophthalmology/optometry visit for glaucoma screening and checkup Recommended yearly dental visit for hygiene and checkup  Vaccinations: Influenza vaccine: 10/15/2020 Pneumococcal vaccine: 12/08/2011, 12/25/2014 Tdap vaccine: 02/14/2010; due every 10 years Shingles vaccine: never done; can check with local pharmacy for cost and vaccine.   Covid-19: 02/13/2019, 03/11/2019, 10/23/2019, 04/20/2020, 10/04/2020  Advanced directives: Please bring a copy of your health care power of attorney and living will to the office at your convenience.  Conditions/risks identified: Yes; Client understands the importance of follow-up with providers by attending scheduled visits and discussed goals to eat healthier, increase physical activity, exercise the brain, socialize more, get enough sleep and make time for laughter.  Next appointment: Please schedule your next Medicare Wellness Visit with your Nurse Health Advisor in 1 year by calling [phone].  Preventive Care 70 Years and Older, Male Preventive care refers to lifestyle choices and visits with your health care provider that can promote health and wellness. What does preventive care include? A yearly physical exam. This is also called an annual well check. Dental exams once or twice a year. Routine eye exams. Ask your health care provider how often you should have your eyes checked. Personal lifestyle choices, including: Daily care of your teeth and gums. Regular physical activity. Eating a healthy diet. Avoiding tobacco and drug use. Limiting alcohol use. Practicing safe sex. Taking low doses of aspirin  every day. Taking vitamin and mineral supplements as recommended by your health care provider. What happens during an annual well check? The services and screenings done by your health care provider during your annual well check will depend on your age, overall health, lifestyle risk factors, and family history of disease. Counseling  Your health care provider may ask you questions about your: Alcohol use. Tobacco use. Drug use. Emotional well-being. Home and relationship well-being. Sexual activity. Eating habits. History of falls. Memory and ability to understand (cognition). Work and work Statistician. Screening  You may have the following tests or measurements: Height, weight, and BMI. Blood pressure. Lipid and cholesterol levels. These may be checked every 5 years, or more frequently if you are over 63 years old. Skin check. Lung cancer screening. You may have this screening every year starting at age 27 if you have a 30-pack-year history of smoking and currently smoke or have quit within the past 15 years. Fecal occult blood test (FOBT) of the stool. You may have this test every year starting at age 32. Flexible sigmoidoscopy or colonoscopy. You may have a sigmoidoscopy every 5 years or a colonoscopy every 10 years starting at age 16. Prostate cancer screening. Recommendations will vary depending on your family history and other risks. Hepatitis C blood test. Hepatitis B blood test. Sexually transmitted disease (STD) testing. Diabetes screening. This is done by checking your blood sugar (glucose) after you have not eaten for a while (fasting). You may have this done every 1-3 years. Abdominal aortic aneurysm (AAA) screening. You may need this if you are a current or former smoker. Osteoporosis. You may be screened starting at age 20 if you are at high risk. Talk with your health care provider about your test results, treatment options, and if  necessary, the need for more  tests. Vaccines  Your health care provider may recommend certain vaccines, such as: Influenza vaccine. This is recommended every year. Tetanus, diphtheria, and acellular pertussis (Tdap, Td) vaccine. You may need a Td booster every 10 years. Zoster vaccine. You may need this after age 27. Pneumococcal 13-valent conjugate (PCV13) vaccine. One dose is recommended after age 68. Pneumococcal polysaccharide (PPSV23) vaccine. One dose is recommended after age 47. Talk to your health care provider about which screenings and vaccines you need and how often you need them. This information is not intended to replace advice given to you by your health care provider. Make sure you discuss any questions you have with your health care provider. Document Released: 01/22/2015 Document Revised: 09/15/2015 Document Reviewed: 10/27/2014 Elsevier Interactive Patient Education  2017 [place] Prevention in the Home Falls can cause injuries. They can happen to people of all ages. There are many things you can do to make your home safe and to help prevent falls. What can I do on the outside of my home? Regularly fix the edges of walkways and driveways and fix any cracks. Remove anything that might make you trip as you walk through a door, such as a raised step or threshold. Trim any bushes or trees on the path to your home. Use bright outdoor lighting. Clear any walking paths of anything that might make someone trip, such as rocks or tools. Regularly check to see if handrails are loose or broken. Make sure that both sides of any steps have handrails. Any raised decks and porches should have guardrails on the edges. Have any leaves, snow, or ice cleared regularly. Use sand or salt on walking paths during winter. Clean up any spills in your garage right away. This includes oil or grease spills. What can I do in the bathroom? Use night lights. Install grab bars by the toilet and in the tub and shower.  Do not use towel bars as grab bars. Use non-skid mats or decals in the tub or shower. If you need to sit down in the shower, use a plastic, non-slip stool. Keep the floor dry. Clean up any water that spills on the floor as soon as it happens. Remove soap buildup in the tub or shower regularly. Attach bath mats securely with double-sided non-slip rug tape. Do not have throw rugs and other things on the floor that can make you trip. What can I do in the bedroom? Use night lights. Make sure that you have a light by your bed that is easy to reach. Do not use any sheets or blankets that are too big for your bed. They should not hang down onto the floor. Have a firm chair that has side arms. You can use this for support while you get dressed. Do not have throw rugs and other things on the floor that can make you trip. What can I do in the kitchen? Clean up any spills right away. Avoid walking on wet floors. Keep items that you use a lot in easy-to-reach places. If you need to reach something above you, use a strong step stool that has a grab bar. Keep electrical cords out of the way. Do not use floor polish or wax that makes floors slippery. If you must use wax, use non-skid floor wax. Do not have throw rugs and other things on the floor that can make you trip. What can I do with my stairs? Do not leave any items  on the stairs. Make sure that there are handrails on both sides of the stairs and use them. Fix handrails that are broken or loose. Make sure that handrails are as long as the stairways. Check any carpeting to make sure that it is firmly attached to the stairs. Fix any carpet that is loose or worn. Avoid having throw rugs at the top or bottom of the stairs. If you do have throw rugs, attach them to the floor with carpet tape. Make sure that you have a light switch at the top of the stairs and the bottom of the stairs. If you do not have them, ask someone to add them for you. What else  can I do to help prevent falls? Wear shoes that: Do not have high heels. Have rubber bottoms. Are comfortable and fit you well. Are closed at the toe. Do not wear sandals. If you use a stepladder: Make sure that it is fully opened. Do not climb a closed stepladder. Make sure that both sides of the stepladder are locked into place. Ask someone to hold it for you, if possible. Clearly mark and make sure that you can see: Any grab bars or handrails. First and last steps. Where the edge of each step is. Use tools that help you move around (mobility aids) if they are needed. These include: Canes. Walkers. Scooters. Crutches. Turn on the lights when you go into a dark area. Replace any light bulbs as soon as they burn out. Set up your furniture so you have a clear path. Avoid moving your furniture around. If any of your floors are uneven, fix them. If there are any pets around you, be aware of where they are. Review your medicines with your doctor. Some medicines can make you feel dizzy. This can increase your chance of falling. Ask your doctor what other things that you can do to help prevent falls. This information is not intended to replace advice given to you by your health care provider. Make sure you discuss any questions you have with your health care provider. Document Released: 10/22/2008 Document Revised: 06/03/2015 Document Reviewed: 01/30/2014 Elsevier Interactive Patient Education  2017 [employer].

## 2020-10-19 NOTE — Telephone Encounter (Signed)
Spoke with pt and let him know the recommendations. Pt reports that the past 2 bowel movements he has had were normal. He thinks he did not give the miralax enough time to work. He will call back if he decides to try the amitiza and then script can be sent to pharmacy.

## 2020-11-01 ENCOUNTER — Encounter: Payer: Self-pay | Admitting: Internal Medicine

## 2020-11-02 NOTE — Progress Notes (Signed)
Assessment/Plan:   1.  Parkinsons Disease  -Continue carbidopa/levodopa 25/100, 1 tablet 3 times per day  -he is doing great with exercise.  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  History of melanoma  -Patient follows with dermatology.  -Patient understands that Parkinson's also slightly increases risk for melanoma.  He has an appt with Dr. Ronnald Ramp on Monday  3.  Heart block  -pt following with cardiology.  4.  Constipation  -discussed nature and pathophysiology and association with PD  -discussed importance of hydration.  Pt does drink 100 oz of water per day  -pt is given a copy of the rancho recipe  -recommended daily colace  -on miralax  -GI recommended amitiza but he opted to stay on miralax for now  -pt is on keto which contributes    Subjective:   Douglas Johnston. was seen today in follow up for Parkinsons disease.  My previous records were reviewed prior to todays visit as well as outside records available to me. Pt with wife who supplements hx. no falls since last visit.  Dr. Lovena Le did contact me after our last visit and told me that the patient's type of heart block generally is better with exertion, so he felt that it was not the cause of dizziness when the patient played pickle ball.  He did have the patient wear a Zio patch which demonstrated known AVWB and periods of 2: 1 AV block, mostly at night.  He did have frequent PVCs.  Patient was asymptomatic.  No changes were made.  He was encouraged to restart his exercise program.  Patient was in the emergency room at the end of September with constipation/fecal impaction.  He ended up calling GI and Amitiza was recommended, but ultimately, patient just went back to MiraLAX.  Current prescribed movement disorder medications: Carbidopa/levodopa 25/100, 1 tablet at 7 AM/11 AM/4 PM   PREVIOUS MEDICATIONS:  propranolol 40 mg 3  times per day (bradycardia); primidone 100 mg twice daily (with Dr. Felecia Shelling); levodopa  ALLERGIES:   Allergies  Allergen Reactions   Tessalon [Benzonatate] Other (See Comments)    Inc cough, wheeze, SOB    CURRENT MEDICATIONS:  Outpatient Encounter Medications as of 11/04/2020  Medication Sig   alclomethasone (ACLOVATE) 0.05 % cream Apply 1 application topically daily.    carbidopa-levodopa (SINEMET IR) 25-100 MG tablet TAKE ONE TABLET BY MOUTH THREE TIMES DAILY   Multiple Vitamin (MULTIVITAMIN) tablet Take 1 tablet by mouth daily.   polyethylene glycol (MIRALAX / GLYCOLAX) 17 g packet Take 17 g by mouth daily.   Probiotic Product (PROBIOTIC FORMULA PO) Take 1 capsule by mouth daily. Align   triamcinolone (KENALOG) 0.1 % SMARTSIG:1 Application Topical 2-3 Times Daily   amLODipine (NORVASC) 5 MG tablet TAKE 1&1/2 TABLETS ONCE DAILY.   No facility-administered encounter medications on file as of 11/04/2020.    Objective:   PHYSICAL EXAMINATION:    VITALS:   Vitals:   11/04/20 1435  BP: 132/68  Pulse: 68  SpO2: 97%  Weight: 160 lb 6.4 oz (72.8 kg)  Height: 6' (1.829 m)     GEN:  The patient appears stated age and is in NAD. HEENT:  Normocephalic, atraumatic.  The mucous membranes are moist. The superficial temporal arteries are without ropiness or tenderness.   Neurological examination:  Orientation: The patient is alert and oriented x3. Cranial nerves: There is good  facial symmetry without facial hypomimia. The speech is fluent and clear. Soft palate rises symmetrically and there is no tongue deviation. Hearing is intact to conversational tone. Sensation: Sensation is intact to light touch throughout Motor: Strength is at least antigravity x4.  Movement examination: Tone: There is normal tone UE/LE Abnormal movements: there is mild LUE rest tremor Coordination:  There is decremation with RAM's, only with the action of "turning in a lightbulb" Gait and Station: The  patient has no difficulty arising out of a deep-seated chair without the use of the hands. The patient's stride length is good.    I have reviewed and interpreted the following labs independently    Chemistry      Component Value Date/Time   NA 134 (L) 09/28/2020 1515   NA 141 04/03/2019 0924   K 3.9 09/28/2020 1515   CL 100 09/28/2020 1515   CO2 24 09/28/2020 1515   BUN 23 09/28/2020 1515   BUN 20 04/03/2019 0924   CREATININE 0.74 09/28/2020 1515      Component Value Date/Time   CALCIUM 9.3 09/28/2020 1515   ALKPHOS 48 09/28/2020 1515   AST 18 09/28/2020 1515   ALT <5 09/28/2020 1515   BILITOT 1.2 09/28/2020 1515       Lab Results  Component Value Date   WBC 12.8 (H) 09/28/2020   HGB 13.1 09/28/2020   HCT 39.1 09/28/2020   MCV 94.2 09/28/2020   PLT 191 09/28/2020    Lab Results  Component Value Date   TSH 3.91 05/13/2020    Cc:  Binnie Rail, MD

## 2020-11-04 ENCOUNTER — Other Ambulatory Visit: Payer: Self-pay

## 2020-11-04 ENCOUNTER — Ambulatory Visit: Payer: Medicare Other | Admitting: Neurology

## 2020-11-04 ENCOUNTER — Encounter: Payer: Self-pay | Admitting: Neurology

## 2020-11-04 DIAGNOSIS — G20 Parkinson's disease: Secondary | ICD-10-CM | POA: Diagnosis not present

## 2020-11-04 MED ORDER — CARBIDOPA-LEVODOPA 25-100 MG PO TABS: 1.0000 | ORAL_TABLET | Freq: Three times a day (TID) | ORAL | 1 refills | Status: DC

## 2020-11-04 NOTE — Patient Instructions (Addendum)
Constipation and Parkinson's disease:  1.Rancho recipe for constipation in Parkinsons Disease:  -1 cup of unprocessed bran (need to get this at [employer], [employer] or similar type of store), 2 cups of applesauce in 1 cup of prune juice 2.  Increase fiber intake (Metamucil,vegetables) 3.  Regular, moderate exercise can be beneficial. 4.  Avoid medications causing constipation, such as medications like antacids with calcium or magnesium 5.  It's okay to take daily Miralax, and taper if stools become too loose or you experience diarrhea 6.  Stool softeners (Colace) can help with chronic constipation and I recommend you take this daily.  Online Resources for Power over Parkinson's Group October 2022  Local [place] Online Groups  Power over [employer] Group :   Power Over Parkinson's Patient Education Group will be Wednesday, October 12th-*Hybrid meting*- in person at [facility] location and via [facility] at 2:00 pm.   Upcoming [employer] Meetings:  2nd Wednesdays of the month at 2 pm:  October 12th, November 9th Contact Amy Marriott at [email or web address] if interested in participating in this online group Parkinson's Care Partners Group:    3rd Mondays, Contact Misty Paladino Atypical Parkinsonian Patient Group:   4th Wednesdays, Section If you are interested in participating in these online groups with Misty, please contact her directly for how to join those meetings.  Her contact information is [email or web address].   [place]:  [email or web address] PD Health at Home continues:  Mindfulness Mondays, Expert Briefing Tuesdays, Wellness Wednesdays, Take Time Thursdays, Fitness Fridays -Listings for June 2022 are on the website Upcoming Webinar:  Expert Briefing:  Let's Talk about Dementia.  Wednesday, November 2nd  at 1 pm. Upcoming Webinar:  Understanding Gene and Cell-Based Therapies in Parkinson's.  Wednesday, October  5th at 1 pm Register for expert briefings (webinars) at [email or web address]  Please check out their website to sign up for emails and see their full online offerings  [place]:  [email or web address]  Upcoming Webinar:   Deep Brain Stimulation:  Is it Right for me or my Loved One?  Thursday, October 20th at 12 noon Check out additional information on their website to see their full online offerings  [place]:  [email or web address] Upcoming Webinar:  Living With and Managing Parkinson's Disease Psychosis.  Tuesday, October 18th at 3 pm.  Live Q & A:  Parkinson's Disease Psychosis.  Friday, October 28th at 3 pm. Care Partner Monthly Meetup.  With Robin Searing Phinney.  First Tuesday of each month, 2 pm Joy Breaks:  First Wednesday of each month, 2-3 pm. There will be art, doodling, making, crafting, listening, laughing, stories, and everything in between. No art experience necessary. No supplies required. Just show up for joy!  Register on their website. Check out additional information to Live Well Today on their website  Parkinson and Movement Disorders (PMD) Alliance:  www.pmdalliance.org [employer] Online:  Online Education Events Sign up for emails, which are sent weekly to give you updates on programming and online offerings  Parkinson's Association of the [place]:  www.[email or web address] Information on online support groups, education events, and online exercises including Yoga, Parkinson's exercises and more-LOTS of information on links to PD resources and online events Virtual Support Group through Parkinson's Association of the [place]; next one is scheduled for Wednesday, October 5th at 2 pm.  (These are typically scheduled for the 1st Wednesday of the month at 2 pm).  Visit website for details.  Additional links for movement activities: Parkinson's DRUMMING  Classes/Music Therapy with Doylene Canning:  This is a returning class and it's FREE!  2nd Mondays, continuing October 10th.  Contact *Misty Taylor-Paladino at [email or web address] or Doylene Canning at [phone] or [email or web address]  PWR! Moves Classes at [place] RESUMED!  Wednesdays 10 and 11 am.  Contact Amy Marriott, PT [email or web address] if interested Here is a link to the PWR!Moves classes on Zoom from [place] - Daily Mon-Sat at 10:00. Via Zoom, FREE and open to all.  There is also a link below via Facebook if you use that platform. [email or web address] Parkinson's Wellness Recovery (PWR! Moves)  www.[email or web address] Info on the PWR! Virtual Experience:  You will have access to our expertise through self-assessment, guided plans that start with the PD-specific fundamentals, educational content, tips, Q&A with an expert, and a growing Art therapist of PD-specific pre-recorded and live exercise classes of varying types and intensity - both physical and cognitive! If that is not enough, we offer 1:1 wellness consultations (in-person or virtual) to personalize your PWR! Research scientist (medical).  [place] Fridays:  As part of the PD Health @ Home program, this free video series focuses each week on one aspect of fitness designed to support people living with Parkinson's.  These weekly videos highlight the [place] recent fitness guidelines for people with Parkinson's disease.  HollywoodSale.dk Dance for PD website is offering free, live-stream classes throughout the week, as well as links to [employer] of classes:   [email or web address] Dance for Parkinson's Class:  [place].  Free offering for people with Parkinson's and care partners; virtual class.  For more information, contact [phone] or email Ruffin Frederick at [email or web address] Virtual dance and Pilates for Parkinson's classes: Click on the Community Tab> Parkinson's Movement Initiative Tab.  To register for classes and for more information, visit [email or web address] and click the "community" tab.  [employer] Parkinson's Cycling Classes  [employer]: 1pm on Fridays-Live classes at [employer] ([employer] at [email or web address] or [phone]) [employer]: Virtual Classes Mondays and Thursdays Jeanette Caprice classes Tuesday, Wednesday and Thursday (contact Leon at [email or web address]  or [phone])  [place] Varied levels of classes are offered Mondays, Tuesdays and Thursdays at [employer].  To observe a class or for more information, call [phone] or email [email or web address] [employer]: Online Caregiver Education Opportunities:  www.well-[email or web address]/caregiver-education/caregiver-support-group.  You may also contact Vickki Muff at [email or web address] or [phone].   Powerful Tools for Caregivers:  6-week program beginning Thursday, October 13th.  This six-week educational series designed to provide family caregivers with practical tools to care for themselves while caring for a loved one Unlocking Dementia through [facility], Humor, and Understanding:  5-week series beginning September 7th Well-Spring Navigator:  03-16-2001 program, a free service to help individuals and families through the journey of determining care for older adults.  The "Navigator" is a [employer], Education officer, museum, who will speak with a prospective client and/or loved ones to provide an assessment of the situation and a set of  recommendations for a personalized care plan -- all free of charge, and whether [employer]Spring [employer] offers the needed service or not. If the need is not a service we provide, we are well-connected with reputable programs in town that we can refer you to.  www.well-[email or web address] or to speak with the Navigator, call [phone].

## 2020-11-08 DIAGNOSIS — C44311 Basal cell carcinoma of skin of nose: Secondary | ICD-10-CM | POA: Diagnosis not present

## 2020-12-01 ENCOUNTER — Other Ambulatory Visit: Payer: Self-pay | Admitting: Internal Medicine

## 2021-02-14 ENCOUNTER — Emergency Department
Admission: RE | Admit: 2021-02-14 | Discharge: 2021-02-14 | Disposition: A | Discharge status: Home / Self Care | Payer: Medicare Other | Source: Ambulatory Visit

## 2021-02-14 ENCOUNTER — Other Ambulatory Visit: Payer: Self-pay

## 2021-02-14 VITALS — BP 130/72 | HR 69 | Temp 99.4°F | Resp 18 | Ht 72.0 in | Wt 160.0 lb

## 2021-02-14 DIAGNOSIS — R059 Cough, unspecified: Secondary | ICD-10-CM | POA: Diagnosis not present

## 2021-02-14 DIAGNOSIS — U071 COVID-19: Secondary | ICD-10-CM | POA: Diagnosis not present

## 2021-02-14 MED ORDER — MOLNUPIRAVIR EUA 200MG CAPSULE: 4.0000 | ORAL_CAPSULE | Freq: Two times a day (BID) | ORAL | 0 refills | Status: AC

## 2021-02-14 MED ORDER — NIRMATRELVIR/RITONAVIR (PAXLOVID)TABLET: 3.0000 | ORAL_TABLET | Freq: Two times a day (BID) | ORAL | 0 refills | Status: DC

## 2021-02-14 NOTE — Discharge Instructions (Addendum)
Advised patient to take medication as directed with food to completion.  Advised patient to self quarantine for the next 10 days or until Friday, 02/25/2021.  Advised patient if asymptomatic afebrile may discontinue self quarantine 5 days later or Saturday, 02/19/2021.  Encouraged patient to increase daily water intake while taking this medication.  Advised patient/family member may use OTC Delsym for cough and/or consult with your PCP for their recommendations regarding cough medicine with Molnupiravir.

## 2021-02-14 NOTE — ED Triage Notes (Signed)
Pt states that he has a sore throat, body aches, and fever. X2 days  Pt states that he had a positive covid test 2/6.  Pt states that he is vaccinated for covid. Pt states that he has had flu vaccine.

## 2021-02-14 NOTE — ED Provider Notes (Signed)
[facility]    CSN: [id number] Arrival date & time: 02/14/21  1000      History   Chief Complaint Chief Complaint  Patient presents with   Sore Throat    Sore throat, cough, body aches and fever. X2 days    HPI Douglas Johnston. is a 76 y.o. male.   HPI 76 year old male presents with positive COVID-19 this morning on home test.  Reports sore throat, cough, body aches, and fever for 2 days.  PMH sick and significant for HTN.  Heart murmur, Gilberts syndrome, Parkinson disease, and skin cancer. Patient is accompanied by family member.  Past Medical History:  Diagnosis Date   Arthritis    LEFT shoulder   Bowel habit changes 06/2016   on Ketogenic diet/ does not go to the BR but every 3 days   BPH (benign prostatic hyperplasia)    Cataract 2015   bilateral sx   Diverticulosis of colon 2008   External hemorrhoids    GERD (gastroesophageal reflux disease)    with certain foods   Gilbert's syndrome    Heart murmur    History of anesthesia complications    Per pt, gets very light-headed past sedation/ fainted 1 time!   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/21/2015   Right ankle swelling 07/21/2015   PCO (posterior capsular opacification), right 08/06/2013   Pseudophakia of both eyes 07/18/2013   Dermatochalasis of both upper eyelids 05/14/2013   Right carotid bruit 12/28/2012   Nuclear cataract 03/28/2012   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   Testosterone deficiency 01/26/2009   Essential hypertension 01/26/2009   ELECTROCARDIOGRAM, ABNORMAL 01/26/2009   SKIN CANCER, HX OF 01/26/2009   ATRIAL SEPTAL  DEFECT, 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 Authorizing Provider  alclomethasone (ACLOVATE) 0.05 % cream Apply 1 application topically daily.    Yes [provider]  amLODipine (NORVASC) 5 MG tablet TAKE 1&1/2 TABLETS ONCE DAILY. 12/01/20  Yes Burns, Claudina Lick, MD  carbidopa-levodopa (SINEMET IR) 25-100 MG tablet Take 1 tablet by mouth 3 (three) times daily. 11/04/20  Yes Tat, Eustace Quail, DO  molnupiravir EUA (LAGEVRIO) 200 mg CAPS capsule Take 4 capsules (800 mg total) by mouth 2 (two) times daily for 5 days. 02/14/21 02/19/21 Yes Eliezer Lofts,  FNP  Multiple Vitamin (MULTIVITAMIN) tablet Take 1 tablet by mouth daily.   Yes [provider]  polyethylene glycol (MIRALAX / GLYCOLAX) 17 g packet Take 17 g by mouth daily.   Yes [provider]  Probiotic Product (PROBIOTIC FORMULA PO) Take 1 capsule by mouth daily. Align   Yes [provider]  triamcinolone (KENALOG) 0.1 % SMARTSIG:1 Application Topical 2-3 Times Daily 01/20/20  Yes [provider]    Family History Family History  Problem Relation Age of Onset   Hypertension Mother    Other Mother        intestine blockage-burst   Emphysema Father        smoker   Cancer Neg Hx    Diabetes Neg Hx    Heart disease Neg Hx    Stroke Neg Hx     Colon cancer Neg Hx    Colon polyps Neg Hx    Stomach cancer Neg Hx    Rectal cancer Neg Hx     Social History Social History   Tobacco Use   Smoking status: Never   Smokeless tobacco: Never  Vaping Use   Vaping Use: Never used  Substance Use Topics   Alcohol use: Not Currently    Comment: special occasions   Drug use: No     Allergies   Tessalon [benzonatate]   Review of Systems Review of Systems  Constitutional:  Positive for fever.  HENT:  Positive for congestion and sore throat.   Respiratory:  Positive for cough.   All other systems reviewed and are negative.   Physical Exam Triage Vital Signs ED Triage Vitals  Enc Vitals Group     BP 02/14/21 1031 130/72     Pulse Rate 02/14/21 1031 69     Resp 02/14/21 1031 18     Temp 02/14/21 1031 99.4 F (37.4 C)     Temp Source 02/14/21 1031 Oral     SpO2 02/14/21 1031 95 %     Weight 02/14/21 1026 160 lb (72.6 kg)     Height 02/14/21 1026 6' (1.829 m)     Head Circumference --      Peak Flow --      Pain Score 02/14/21 1026 7     Pain Loc --      Pain Edu? --      Excl. in [place]? --    No data found.  Updated Vital Signs BP 130/72 (BP Location: Right Arm)    Pulse 69    Temp 99.4 F (37.4 C) (Oral)    Resp 18    Ht 6' (1.829 m)    Wt 160 lb (72.6 kg)    SpO2 95%    BMI 21.70 kg/m   Physical Exam Vitals and nursing note reviewed.  Constitutional:      General: He is not in acute distress.    Appearance: Normal appearance. He is normal weight. He is not ill-appearing.  HENT:     Head: Normocephalic and atraumatic.     Right Ear: Tympanic membrane, ear canal and external ear normal.     Left Ear: Tympanic membrane, ear canal and external ear normal.     Nose: Nose normal.     Mouth/Throat:     Mouth: Mucous membranes are moist.     Pharynx: Oropharynx is clear.  Eyes:     Extraocular Movements: Extraocular movements intact.     Conjunctiva/sclera: Conjunctivae normal.     Pupils: Pupils are equal,  round, and reactive to light.  Cardiovascular:     Rate and Rhythm: Normal rate and regular rhythm.     Pulses: Normal pulses.     Heart sounds: Normal heart sounds.  Pulmonary:     Effort: Pulmonary effort is normal.     Breath sounds: Normal breath sounds.  Musculoskeletal:     Cervical back: Normal range of motion and neck supple.  Skin:    General: Skin is warm and dry.  Neurological:     General: No focal deficit present.     Mental Status: He is alert and oriented to person, place, and time.     UC Treatments / Results  Labs (all labs ordered are listed, but only abnormal results are displayed) Labs Reviewed - No data to display  EKG   Radiology No results found.  Procedures Procedures (including critical care time)  Medications Ordered in UC Medications - No data to display  Initial Impression / Assessment and Plan / UC Course  I have reviewed the triage vital signs and the nursing notes.  Pertinent labs & imaging results that were available during my care of the patient were reviewed by me and considered in my medical decision making (see chart for details).     MDM: 1.  COVID-19-Rx'd Molupnivair; 2.  Cough-Advised OTC Delsym or to follow-up with PCP for further recommendations regarding cough medications in addition to Paxlovid for COVID-19. Advised patient to take medication as directed with food to completion.  Advised patient to self quarantine for the next 10 days or until Friday, 02/25/2021.  Advised patient if asymptomatic afebrile may discontinue self quarantine 5 days later or Saturday, 02/19/2021.  Encouraged patient to increase daily water intake while taking this medication.  Advised patient/family member may use OTC Delsym for cough and/or consult with your PCP for their recommendations regarding cough medicine with Molnupiravir.  Final Clinical Impressions(s) / UC Diagnoses   Final diagnoses:  COVID-19  Cough, unspecified type     Discharge  Instructions      Advised patient to take medication as directed with food to completion.  Advised patient to self quarantine for the next 10 days or until Friday, 02/25/2021.  Advised patient if asymptomatic afebrile may discontinue self quarantine 5 days later or Saturday, 02/19/2021.  Encouraged patient to increase daily water intake while taking this medication.  Advised patient/family member may use OTC Delsym for cough and/or consult with your PCP for their recommendations regarding cough medicine with Molnupiravir.     ED Prescriptions     Medication Sig Dispense Auth. Provider   nirmatrelvir/ritonavir EUA (PAXLOVID) 20 x 150 MG & 10 x 100MG TABS  (Status: Discontinued) Take 3 tablets by mouth 2 (two) times daily for 5 days. Patient GFR is 83.61. Take nirmatrelvir (150 mg) two tablets twice daily for 5 days and ritonavir (100 mg) one tablet twice daily for 5 days. 30 tablet Eliezer Lofts, FNP   molnupiravir EUA (LAGEVRIO) 200 mg CAPS capsule Take 4 capsules (800 mg total) by mouth 2 (two) times daily for 5 days. 40 capsule Eliezer Lofts, FNP      PDMP not reviewed this encounter.   Eliezer Lofts, [place] 02/14/21 1245

## 2021-02-23 NOTE — Progress Notes (Signed)
Subjective:    Patient ID: Douglas Hammond., male    DOB: [birth year], 76 y.o.   MRN: [id number]  This visit occurred during the SARS-CoV-2 public health emergency.  Safety protocols were in place, including screening questions prior to the visit, additional usage of staff PPE, and extensive cleaning of exam room while observing appropriate contact time as indicated for disinfecting solutions.    HPI The patient is here for an acute visit.  Post covid congestion   ED 2/6 for covid like symptoms and tested that day at home and was positive.  He went to urgent care  - prescribed molnupiravir, delsym   He has a lingering ST and cough and congestion, PND.  He has tried cough syrup.  This morning he brought up pale yellow mucus, but primarily it has been clear  Last couple of  days he has had some lightheadedness going up stairs. Has not had that before.  BP well controlled at home.     Taking mucinex. He is taking allegra.    Medications and allergies reviewed with patient and updated if appropriate.  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      ALLEGRA ALLERGY 180 MG tablet SMARTSIG:1 By Mouth     amLODipine (NORVASC) 5 MG tablet TAKE 1&1/2 TABLETS ONCE DAILY. 135 tablet 0   bifidobacterium infantis (ALIGN) capsule SMARTSIG:1 By Mouth     carbidopa-levodopa (SINEMET IR) 25-100 MG tablet Take 1 tablet by mouth 3 (three) times daily. 270 tablet 1   Multiple Vitamin (MULTIVITAMIN) tablet Take 1 tablet by mouth daily.     Multiple Vitamins-Minerals (CENTRUM SILVER 50+MEN) TABS SMARTSIG:1 By Mouth     polyethylene glycol (MIRALAX / GLYCOLAX) 17 g packet Take 17 g by mouth daily.     Probiotic Product (PROBIOTIC FORMULA PO) Take 1 capsule by mouth daily. Align     triamcinolone (KENALOG) 0.1 % SMARTSIG:1 Application Topical 2-3 Times Daily     No current facility-administered medications on file prior to visit.    Past Medical History:  Diagnosis Date   Arthritis    LEFT shoulder   Bowel habit changes 06/2016   on Ketogenic diet/ does not go to the BR but every 3 days   BPH (benign prostatic hyperplasia)    Cataract 2015   bilateral sx   Diverticulosis of colon 2008   External hemorrhoids    GERD (gastroesophageal reflux disease)    with certain foods   Gilbert's syndrome    Heart murmur    History of  anesthesia complications    Per pt, gets very light-headed past sedation/ fainted 1 time!   Hyperlipidemia    on meds   Hypertension    on meds   Skin cancer    X2 in [place]; now seeing Dr Georgia Duff    Past Surgical History:  Procedure Laterality Date   [place]- open heart surgery   CATARACT EXTRACTION Bilateral 07/2013   bilateral eyes; Dr Thyra Breed, [place]  2008   [facility]   COLONOSCOPY  2017   JMP-MAC-2 day suprep(good)-polyps   INGUINAL HERNIA REPAIR Left 2006   [place]     as child   WISDOM TOOTH EXTRACTION      Social History   Socioeconomic History   Marital status: Married    Spouse name: Zai Chmiel   Number of children: 0   Years of education: BA   Highest education level: Not on file  Occupational History   Occupation: Retired  Tobacco Use   Smoking status: Never   Smokeless tobacco: Never  Vaping Use   Vaping Use: Never used  Substance and Sexual Activity   Alcohol use: Not Currently    Comment: special occasions   Drug use: No   Sexual activity: Yes  Other Topics Concern   Not on file  Social History Narrative      Caffeine use: coffee daily   Right handed    Social Determinants of Health   Financial Resource Strain: Low Risk    Difficulty of Paying Living Expenses: Not hard at all  Food Insecurity: No Food Insecurity   Worried About Charity fundraiser in the Last Year: Never true   [place] in the Last Year: Never true  Transportation Needs: No Transportation Needs   Lack of Transportation (Medical): No   Lack of Transportation (Non-Medical): No  Physical Activity: Sufficiently Active   Days of Exercise per Week: 5 days   Minutes of Exercise per Session: 120 min  Stress: No Stress Concern Present   Feeling of Stress : Not at all  Social Connections: Socially Integrated   Frequency of Communication with Friends and Family: More than three times a week   Frequency of Social Gatherings with Friends and Family: More than three times a week   Attends Religious Services: More than 4 times per year   Active Member of [employer] or Organizations: Yes   Attends Music therapist: More than 4 times per year   Marital Status: Married    Family History  Problem Relation Age of Onset   Hypertension Mother    Other Mother        intestine blockage-burst   Emphysema Father        smoker   Cancer Neg Hx    Diabetes Neg Hx    Heart disease Neg Hx     Stroke Neg Hx    Colon cancer Neg Hx    Colon polyps Neg Hx    Stomach cancer Neg Hx    Rectal cancer Neg Hx     Review of Systems  Constitutional:  Negative for fever.  HENT:  Positive for congestion and postnasal drip. Negative for sinus pressure, sinus pain and sore throat (resolved).   Respiratory:  Positive for cough (related to PND). Negative for shortness of  breath and wheezing.   Neurological:  Positive for light-headedness. Negative for headaches.      Objective:   Vitals:   02/24/21 0959  BP: 130/80  Pulse: 80  Temp: 98.3 F (36.8 C)  SpO2: 96%   BP Readings from Last 3 Encounters:  02/24/21 130/80  02/14/21 130/72  11/04/20 132/68   Wt Readings from Last 3 Encounters:  02/24/21 159 lb 12.8 oz (72.5 kg)  02/14/21 160 lb (72.6 kg)  11/04/20 160 lb 6.4 oz (72.8 kg)   Body mass index is 21.67 kg/m.   Physical Exam    GENERAL APPEARANCE: Appears stated age, well appearing, NAD EYES: conjunctiva clear, no icterus HENT: bilateral tympanic membranes and ear canals normal, oropharynx with no erythema or exudates, trachea midline, no cervical or supraclavicular lymphadenopathy LUNGS: Unlabored breathing, good air entry bilaterally, clear to auscultation without wheeze or crackles CARDIOVASCULAR: Normal S1,S2 , no edema SKIN: Warm, dry      Assessment & Plan:    See Problem List for Assessment and Plan of chronic medical problems.

## 2021-02-24 ENCOUNTER — Ambulatory Visit (INDEPENDENT_AMBULATORY_CARE_PROVIDER_SITE_OTHER): Payer: Medicare Other | Admitting: Internal Medicine

## 2021-02-24 ENCOUNTER — Encounter: Payer: Self-pay | Admitting: Internal Medicine

## 2021-02-24 ENCOUNTER — Other Ambulatory Visit: Payer: Self-pay

## 2021-02-24 DIAGNOSIS — I10 Essential (primary) hypertension: Secondary | ICD-10-CM | POA: Diagnosis not present

## 2021-02-24 DIAGNOSIS — R058 Other specified cough: Secondary | ICD-10-CM | POA: Diagnosis not present

## 2021-02-24 MED ORDER — HYDROCOD POLI-CHLORPHE POLI ER 10-8 MG/5ML PO SUER: 5.0000 mL | Freq: Two times a day (BID) | ORAL | 0 refills | Status: DC | PRN

## 2021-02-24 NOTE — Patient Instructions (Addendum)
° ° °  Continue the allegra and mucinex.     Medications changes include :   cough syrup for nighttime.     Your prescription(s) have been sent to your pharmacy.      Please call if there is no improvement in your symptoms.

## 2021-02-24 NOTE — Assessment & Plan Note (Signed)
Acute Had COVID earlier this month and has residual nasal congestion, postnasal drip and cough Advised symptomatic treatment Tussionex cough syrup Continue Allegra, Mucinex Call if no improvement

## 2021-02-24 NOTE — Assessment & Plan Note (Signed)
Chronic Blood pressure well controlled Continue amlodipine 7.5 mg daily

## 2021-03-23 ENCOUNTER — Encounter: Payer: Self-pay | Admitting: Internal Medicine

## 2021-03-23 NOTE — Patient Instructions (Addendum)
? ? ? ?Blood work was ordered.   ? ? ?Medications changes include :   none ? ? ? ?Return in about 1 year (around 03/25/2022) for CPE. ? ? ?Health Maintenance, Male ?Adopting a healthy lifestyle and getting preventive care are important in promoting health and wellness. Ask your health care provider about: ?The right schedule for you to have regular tests and exams. ?Things you can do on your own to prevent diseases and keep yourself healthy. ?What should I know about diet, weight, and exercise? ?Eat a healthy diet ? ?Eat a diet that includes plenty of vegetables, fruits, low-fat dairy products, and lean protein. ?Do not eat a lot of foods that are high in solid fats, added sugars, or sodium. ?Maintain a healthy weight ?Body mass index (BMI) is a measurement that can be used to identify possible weight problems. It estimates body fat based on height and weight. Your health care provider can help determine your BMI and help you achieve or maintain a healthy weight. ?Get regular exercise ?Get regular exercise. This is one of the most important things you can do for your health. Most adults should: ?Exercise for at least 150 minutes each week. The exercise should increase your heart rate and make you sweat (moderate-intensity exercise). ?Do strengthening exercises at least twice a week. This is in addition to the moderate-intensity exercise. ?Spend less time sitting. Even light physical activity can be beneficial. ?Watch cholesterol and blood lipids ?Have your blood tested for lipids and cholesterol at 78 years of age, then have this test every 5 years. ?You may need to have your cholesterol levels checked more often if: ?Your lipid or cholesterol levels are high. ?You are older than 78 years of age. ?You are at high risk for heart disease. ?What should I know about cancer screening? ?Many types of cancers can be detected early and may often be prevented. Depending on your health history and family history, you may need  to have cancer screening at various ages. This may include screening for: ?Colorectal cancer. ?Prostate cancer. ?Skin cancer. ?Lung cancer. ?What should I know about heart disease, diabetes, and high blood pressure? ?Blood pressure and heart disease ?High blood pressure causes heart disease and increases the risk of stroke. This is more likely to develop in people who have high blood pressure readings or are overweight. ?Talk with your health care provider about your target blood pressure readings. ?Have your blood pressure checked: ?Every 3-5 years if you are 15-34 years of age. ?Every year if you are 49 years old or older. ?If you are between the ages of 76 and 55 and are a current or former smoker, ask your health care provider if you should have a one-time screening for abdominal aortic aneurysm (AAA). ?Diabetes ?Have regular diabetes screenings. This checks your fasting blood sugar level. Have the screening done: ?Once every three years after age 83 if you are at a normal weight and have a low risk for diabetes. ?More often and at a younger age if you are overweight or have a high risk for diabetes. ?What should I know about preventing infection? ?Hepatitis B ?If you have a higher risk for hepatitis B, you should be screened for this virus. Talk with your health care provider to find out if you are at risk for hepatitis B infection. ?Hepatitis C ?Blood testing is recommended for: ?Everyone born from [birth year] through 1967. ?Anyone with known risk factors for hepatitis C. ?Sexually transmitted infections (STIs) ?You should be  screened each year for STIs, including gonorrhea and chlamydia, if: ?You are sexually active and are younger than 78 years of age. ?You are older than 78 years of age and your health care provider tells you that you are at risk for this type of infection. ?Your sexual activity has changed since you were last screened, and you are at increased risk for chlamydia or gonorrhea. Ask your health care  provider if you are at risk. ?Ask your health care provider about whether you are at high risk for HIV. Your health care provider may recommend a prescription medicine to help prevent HIV infection. If you choose to take medicine to prevent HIV, you should first get tested for HIV. You should then be tested every 3 months for as long as you are taking the medicine. ?Follow these instructions at home: ?Alcohol use ?Do not drink alcohol if your health care provider tells you not to drink. ?If you drink alcohol: ?Limit how much you have to 0-2 drinks a day. ?Know how much alcohol is in your drink. In the [place] one drink equals one 12 oz bottle of beer (355 mL), one 5 oz glass of wine (148 mL), or one 1? oz glass of hard liquor (44 mL). ?Lifestyle ?Do not use any products that contain nicotine or tobacco. These products include cigarettes, chewing tobacco, and vaping devices, such as e-cigarettes. If you need help quitting, ask your health care provider. ?Do not use street drugs. ?Do not share needles. ?Ask your health care provider for help if you need support or information about quitting drugs. ?General instructions ?Schedule regular health, dental, and eye exams. ?Stay current with your vaccines. ?Tell your health care provider if: ?You often feel depressed. ?You have ever been abused or do not feel safe at home. ?Summary ?Adopting a healthy lifestyle and getting preventive care are important in promoting health and wellness. ?Follow your health care provider's instructions about healthy diet, exercising, and getting tested or screened for diseases. ?Follow your health care provider's instructions on monitoring your cholesterol and blood pressure. ?This information is not intended to replace advice given to you by your health care provider. Make sure you discuss any questions you have with your health care provider. ?Document Revised: 05/17/2020 Document Reviewed: 05/17/2020 ?Elsevier Patient Education ? [place]. ? ?

## 2021-03-23 NOTE — Progress Notes (Signed)
? ? ?Subjective:  ? ? Patient ID: Douglas Johnston., male    DOB: July 16, 1945, 76 y.o.   MRN: [id number] ? ? ?This visit occurred during the SARS-CoV-2 public health emergency.  Safety protocols were in place, including screening questions prior to the visit, additional usage of staff PPE, and extensive cleaning of exam room while observing appropriate contact time as indicated for disinfecting solutions. ? ? ?HPI ?Douglas Johnston is here for  ?Chief Complaint  ?Patient presents with  ? Annual Exam  ? ? ?Ongoing fatigue and lightheadedness.  Eating a sweet potato the mornings he exercises.   ? ? ?Still eating keto diet.  The other days that he does not strenuously exercise he still has some fatigue and wonders if he needs to add a little carbohydrate to those mornings-May try oatmeal. ? ? ?Medications and allergies reviewed with patient and updated if appropriate. ? ? ?Current Outpatient Medications on File Prior to Visit  ?Medication Sig Dispense Refill  ? alclomethasone (ACLOVATE) 0.05 % cream Apply 1 application topically daily.     ? ALLEGRA ALLERGY 180 MG tablet SMARTSIG:1 By Mouth    ? amLODipine (NORVASC) 5 MG tablet TAKE 1&1/2 TABLETS ONCE DAILY. 135 tablet 0  ? bifidobacterium infantis (ALIGN) capsule SMARTSIG:1 By Mouth    ? carbidopa-levodopa (SINEMET IR) 25-100 MG tablet Take 1 tablet by mouth 3 (three) times daily. 270 tablet 1  ? Multiple Vitamin (MULTIVITAMIN) tablet Take 1 tablet by mouth daily.    ? Multiple Vitamins-Minerals (CENTRUM SILVER 50+MEN) TABS SMARTSIG:1 By Mouth    ? polyethylene glycol (MIRALAX / GLYCOLAX) 17 g packet Take 17 g by mouth daily.    ? Probiotic Product (PROBIOTIC FORMULA PO) Take 1 capsule by mouth daily. Align    ? triamcinolone (KENALOG) 0.1 % SMARTSIG:1 Application Topical 2-3 Times Daily    ? ?No current facility-administered medications on file prior to visit.  ? ? ?Review of Systems  ?Constitutional:  Negative for chills and fever.  ?Eyes:  Negative for visual  disturbance.  ?Respiratory:  Positive for cough (occ, PND). Negative for shortness of breath and wheezing.   ?Cardiovascular:  Negative for chest pain, palpitations and leg swelling.  ?Gastrointestinal:  Positive for constipation (miralax daily - controlled). Negative for abdominal pain, blood in stool, diarrhea and nausea.  ?     Occ gerd  ?Genitourinary:  Negative for difficulty urinating, dysuria and hematuria.  ?Musculoskeletal:  Positive for arthralgias (l shoulder, thumbs, hands) and back pain (scoliosis).  ?Skin:  Negative for rash.  ?Neurological:  Positive for light-headedness (when he does not get enough carbs). Negative for headaches.  ?Psychiatric/Behavioral:  Negative for dysphoric mood and sleep disturbance. The patient is not nervous/anxious.   ? ?   ?Objective:  ? ?Vitals:  ? 03/24/21 0843  ?BP: 128/80  ?Pulse: 68  ?Temp: 97.8 ?F (36.6 ?C)  ?SpO2: 97%  ? ?Filed Weights  ? 03/24/21 0843  ?Weight: 159 lb 3.2 oz (72.2 kg)  ? ?Body mass index is 21.59 kg/m?. ? ?BP Readings from Last 3 Encounters:  ?03/24/21 128/80  ?02/24/21 130/80  ?02/14/21 130/72  ? ? ?Wt Readings from Last 3 Encounters:  ?03/24/21 159 lb 3.2 oz (72.2 kg)  ?02/24/21 159 lb 12.8 oz (72.5 kg)  ?02/14/21 160 lb (72.6 kg)  ? ? ?  ?Physical Exam ?Constitutional: He appears well-developed and well-nourished. No distress.  Parkinsonian tremor left arm ?HENT:  ?Head: Normocephalic and atraumatic.  ?Right Ear: External ear normal.  ?Left Ear:  External ear normal.  ?Mouth/Throat: Oropharynx is clear and moist.  ?Normal ear canals and TM b/l  ?Eyes: Conjunctivae and EOM are normal.  ?Neck: Neck supple. No tracheal deviation present. No thyromegaly present.  ?No carotid bruit  ?Cardiovascular: Normal rate, regular rhythm, normal heart sounds and intact distal pulses.   ?No murmur heard. ?Pulmonary/Chest: Effort normal and breath sounds normal. No respiratory distress. He has no wheezes. He has no rales.  ?Abdominal: Soft. He exhibits no  distension. There is no tenderness.  ?Genitourinary: deferred  ?Musculoskeletal: He exhibits no edema.  ?Lymphadenopathy:   He has no cervical adenopathy.  ?Skin: Skin is warm and dry. He is not diaphoretic.  ?Psychiatric: He has a normal mood and affect. His behavior is normal.  ? ? ? ? ?   ?Assessment & Plan:  ? ?Physical exam: ?Screening blood work  ordered ?Exercise   regular ?Weight  normal ?Substance abuse   none ?Derm Q 6 months ? ?Reviewed recommended immunizations. ? ? ?Health Maintenance  ?Topic Date Due  ? Zoster Vaccines- Shingrix (1 of 2) 06/24/2021 (Originally 01/01/1994)  ? TETANUS/TDAP  03/25/2022 (Originally 02/15/2020)  ? Pneumonia Vaccine 53+ Years old  Completed  ? INFLUENZA VACCINE  Completed  ? COVID-19 Vaccine  Completed  ? Hepatitis C Screening  Completed  ? HPV VACCINES  Aged Out  ? COLONOSCOPY (Pts 45-10yr Insurance coverage will need to be confirmed)  Discontinued  ? ? ? ?See Problem List for Assessment and Plan of chronic medical problems. ? ? ?

## 2021-03-24 ENCOUNTER — Ambulatory Visit (INDEPENDENT_AMBULATORY_CARE_PROVIDER_SITE_OTHER): Payer: Medicare Other | Admitting: Internal Medicine

## 2021-03-24 ENCOUNTER — Encounter: Payer: Medicare Other | Admitting: Internal Medicine

## 2021-03-24 ENCOUNTER — Other Ambulatory Visit: Payer: Self-pay

## 2021-03-24 ENCOUNTER — Encounter: Payer: Self-pay | Admitting: Internal Medicine

## 2021-03-24 VITALS — BP 128/80 | HR 68 | Temp 97.8°F | Ht 72.0 in | Wt 159.2 lb

## 2021-03-24 DIAGNOSIS — R739 Hyperglycemia, unspecified: Secondary | ICD-10-CM

## 2021-03-24 DIAGNOSIS — G20 Parkinson's disease: Secondary | ICD-10-CM

## 2021-03-24 DIAGNOSIS — Z0000 Encounter for general adult medical examination without abnormal findings: Secondary | ICD-10-CM

## 2021-03-24 DIAGNOSIS — R5383 Other fatigue: Secondary | ICD-10-CM | POA: Diagnosis not present

## 2021-03-24 DIAGNOSIS — R946 Abnormal results of thyroid function studies: Secondary | ICD-10-CM | POA: Diagnosis not present

## 2021-03-24 DIAGNOSIS — I10 Essential (primary) hypertension: Secondary | ICD-10-CM

## 2021-03-24 DIAGNOSIS — R7989 Other specified abnormal findings of blood chemistry: Secondary | ICD-10-CM | POA: Diagnosis not present

## 2021-03-24 DIAGNOSIS — G20A1 Parkinson's disease without dyskinesia, without mention of fluctuations: Secondary | ICD-10-CM

## 2021-03-24 DIAGNOSIS — E7849 Other hyperlipidemia: Secondary | ICD-10-CM

## 2021-03-24 LAB — LIPID PANEL
Cholesterol: 161 mg/dL (ref 0–200)
HDL: 60.6 mg/dL (ref >39.00)
LDL Cholesterol: 90 mg/dL (ref 0–99)
NonHDL: 100.57
Total CHOL/HDL Ratio: 3
Triglycerides: 54 mg/dL (ref 0.0–149.0)
VLDL: 10.8 mg/dL (ref 0.0–40.0)

## 2021-03-24 LAB — COMPREHENSIVE METABOLIC PANEL
ALT: 5 U/L (ref 0–53)
AST: 19 U/L (ref 0–37)
Albumin: 4.5 g/dL (ref 3.5–5.2)
Alkaline Phosphatase: 59 U/L (ref 39–117)
BUN: 20 mg/dL (ref 6–23)
CO2: 31 mEq/L (ref 19–32)
Calcium: 9.3 mg/dL (ref 8.4–10.5)
Chloride: 101 mEq/L (ref 96–112)
Creatinine, Ser: 0.8 mg/dL (ref 0.40–1.50)
GFR: 85.53 mL/min (ref >60.00)
Glucose, Bld: 93 mg/dL (ref 70–99)
Potassium: 3.9 mEq/L (ref 3.5–5.1)
Sodium: 137 mEq/L (ref 135–145)
Total Bilirubin: 1.1 mg/dL (ref 0.2–1.2)
Total Protein: 7.3 g/dL (ref 6.0–8.3)

## 2021-03-24 LAB — CBC WITH DIFFERENTIAL/PLATELET
Basophils Absolute: 0 10*3/uL (ref 0.0–0.1)
Basophils Relative: 0.6 % (ref 0.0–3.0)
Eosinophils Absolute: 0.1 10*3/uL (ref 0.0–0.7)
Eosinophils Relative: 1 % (ref 0.0–5.0)
HCT: 39.4 % (ref 39.0–52.0)
Hemoglobin: 13.2 g/dL (ref 13.0–17.0)
Lymphocytes Relative: 30.2 % (ref 12.0–46.0)
Lymphs Abs: 1.9 10*3/uL (ref 0.7–4.0)
MCHC: 33.4 g/dL (ref 30.0–36.0)
MCV: 94.6 fl (ref 78.0–100.0)
Monocytes Absolute: 0.5 10*3/uL (ref 0.1–1.0)
Monocytes Relative: 8.2 % (ref 3.0–12.0)
Neutro Abs: 3.8 10*3/uL (ref 1.4–7.7)
Neutrophils Relative %: 60 % (ref 43.0–77.0)
Platelets: 237 10*3/uL (ref 150.0–400.0)
RBC: 4.16 Mil/uL — ABNORMAL LOW (ref 4.22–5.81)
RDW: 13.4 % (ref 11.5–15.5)
WBC: 6.3 10*3/uL (ref 4.0–10.5)

## 2021-03-24 LAB — TSH: TSH: 3.94 u[IU]/mL (ref 0.35–5.50)

## 2021-03-24 LAB — HEMOGLOBIN A1C: Hgb A1c MFr Bld: 5.9 % (ref 4.6–6.5)

## 2021-03-24 NOTE — Assessment & Plan Note (Signed)
Chronic ?Management per Dr Tat ?On Sinemet IR 25-100 mg  TID ?

## 2021-03-24 NOTE — Assessment & Plan Note (Signed)
Chronic ?Check a1c ?Low sugar / carb diet ?regular exercise ? ? ?

## 2021-03-24 NOTE — Assessment & Plan Note (Signed)
H/o elevated tsh ?tsh ?

## 2021-03-24 NOTE — Assessment & Plan Note (Addendum)
Chronic ?Check lipid panel  ?Continue lifestyle control ?Continue regular exercise and healthy diet  ? ?

## 2021-03-24 NOTE — Assessment & Plan Note (Signed)
Chronic ?BP well controlled ?Continue amlodipine 7.5 mg daily ?cmp ? ?

## 2021-03-24 NOTE — Assessment & Plan Note (Signed)
Chronic ?Also has lightheadedness ?Both symptoms worse with moderate-strenuous exercise ?Adding in some carbohydrates have helped ?

## 2021-04-19 DIAGNOSIS — L570 Actinic keratosis: Secondary | ICD-10-CM | POA: Diagnosis not present

## 2021-04-19 DIAGNOSIS — Z85828 Personal history of other malignant neoplasm of skin: Secondary | ICD-10-CM | POA: Diagnosis not present

## 2021-04-19 DIAGNOSIS — L218 Other seborrheic dermatitis: Secondary | ICD-10-CM | POA: Diagnosis not present

## 2021-04-19 DIAGNOSIS — L821 Other seborrheic keratosis: Secondary | ICD-10-CM | POA: Diagnosis not present

## 2021-04-19 DIAGNOSIS — L812 Freckles: Secondary | ICD-10-CM | POA: Diagnosis not present

## 2021-04-19 DIAGNOSIS — I788 Other diseases of capillaries: Secondary | ICD-10-CM | POA: Diagnosis not present

## 2021-04-19 DIAGNOSIS — D1801 Hemangioma of skin and subcutaneous tissue: Secondary | ICD-10-CM | POA: Diagnosis not present

## 2021-05-07 ENCOUNTER — Other Ambulatory Visit: Payer: Self-pay | Admitting: Neurology

## 2021-05-07 DIAGNOSIS — G20 Parkinson's disease: Secondary | ICD-10-CM

## 2021-05-08 ENCOUNTER — Other Ambulatory Visit: Payer: Self-pay | Admitting: Internal Medicine

## 2021-05-09 ENCOUNTER — Other Ambulatory Visit: Payer: Self-pay

## 2021-05-11 IMAGING — DX DG CHEST 2V
2 series · 2 of 2 positions shown · non-contrast
Comparison: No recent.

CLINICAL DATA: Cough.

EXAM:
CHEST - 2 VIEW

[chest pa]
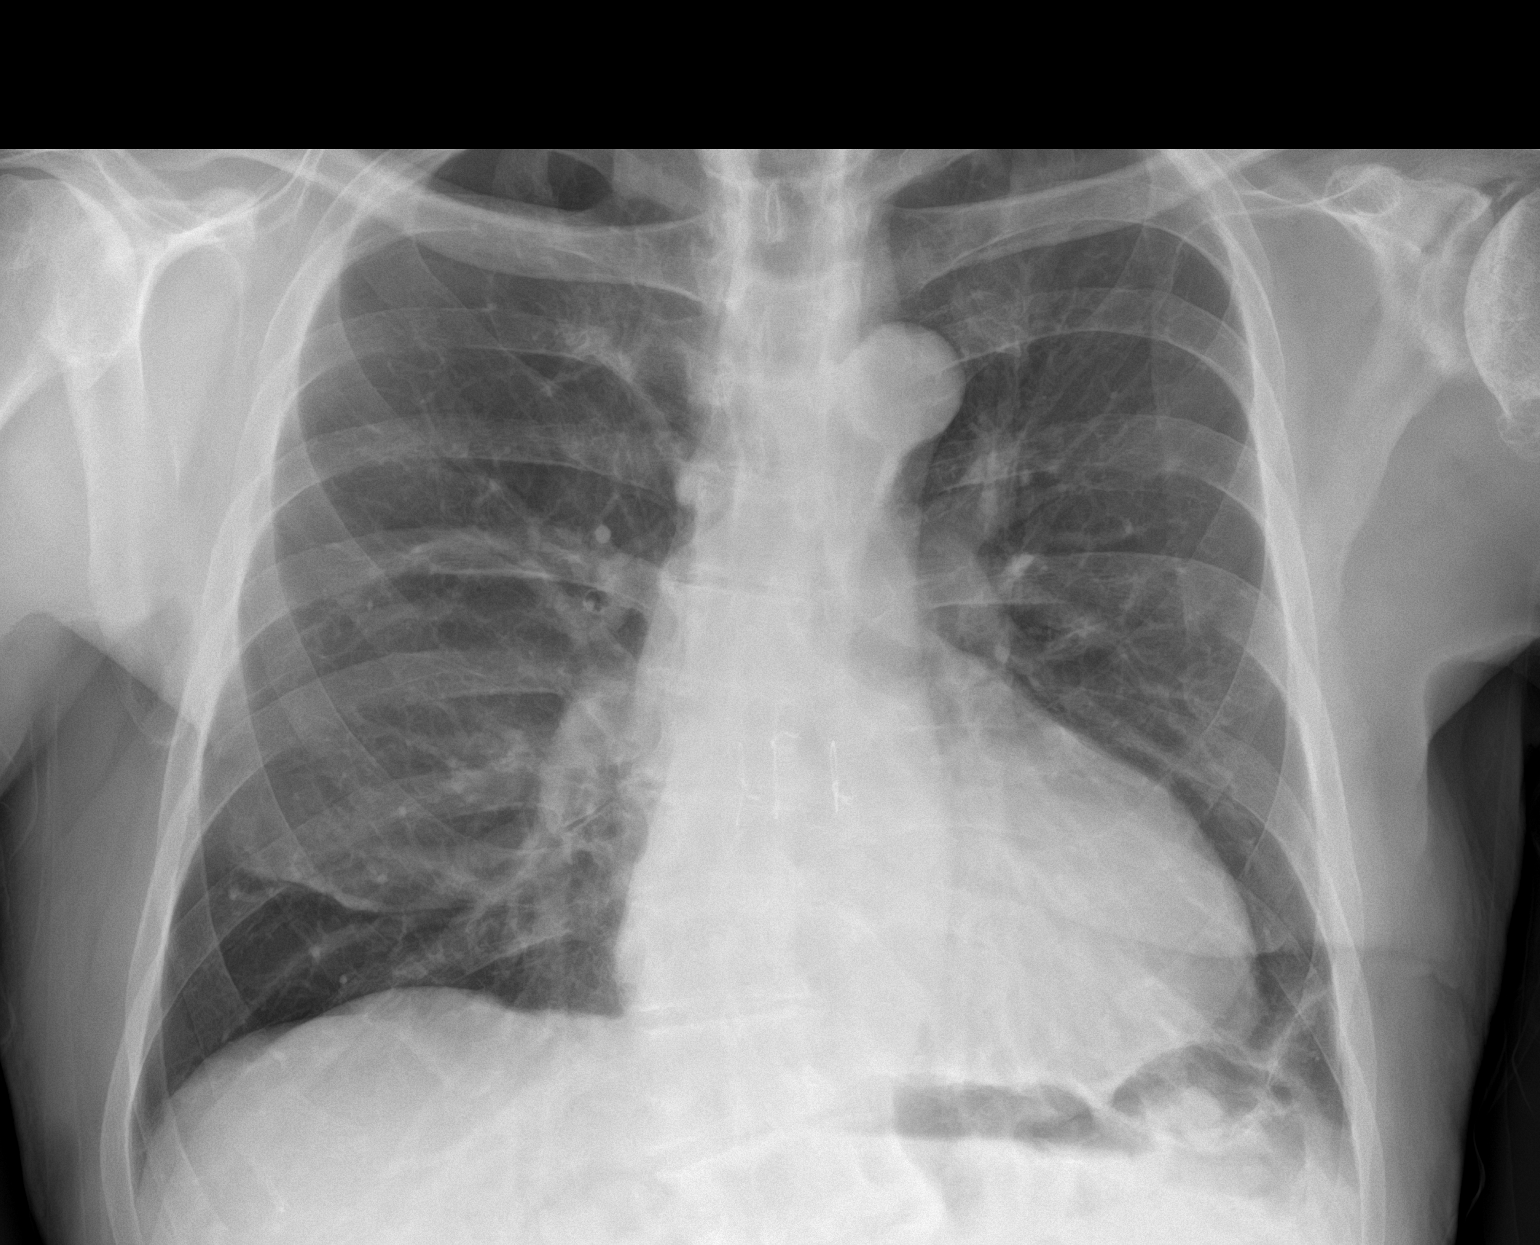

[chest lat]
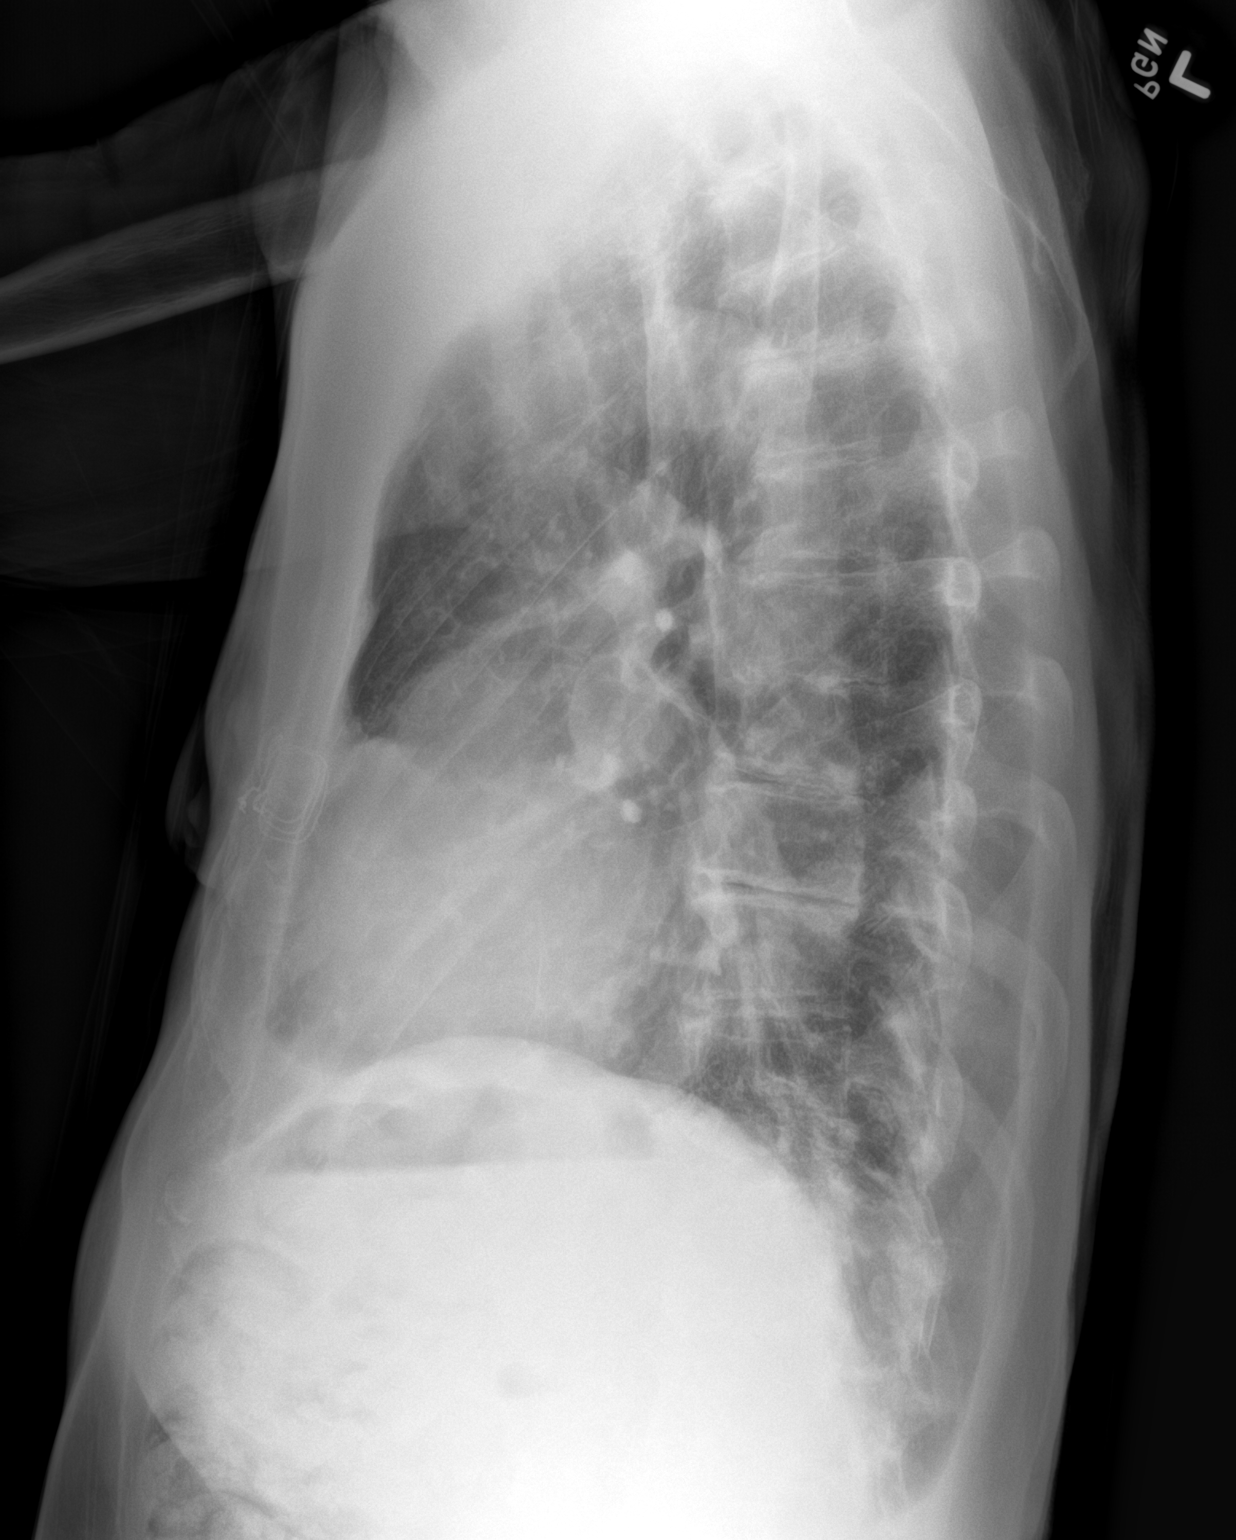

[2 of 2 positions shown; findings below may reference images not displayed]

FINDINGS: Mediastinum and hilar structures normal. Cardiomegaly. No pulmonary
venous congestion. Mild bibasilar atelectasis/infiltrates. Bilateral
pleural-parenchymal thickening most consistent scarring. No pleural
effusion or pneumothorax. Degenerative change in scoliosis thoracic
spine. Degenerative changes both shoulders.
IMPRESSION: 1. Cardiomegaly. No pulmonary venous congestion.
2. Mild bibasilar atelectasis/infiltrates. Bilateral
pleural-parenchymal thickening most consistent scarring.

## 2021-05-11 NOTE — Progress Notes (Signed)
? ? ?Assessment/Plan:  ? ?1.  Parkinsons Disease ? -Continue carbidopa/levodopa 25/100, 1 tablet 3 times per day ? -he is doing great with exercise.  Congratulated him about that ? 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 -pt planning on moving to [place] in the next year.  Given information to [name].  He will be here for another year or so ? ? ?2.  History of melanoma ? -Patient follows with dermatology. ? -Patient understands that Parkinson's also slightly increases risk for melanoma.  He follows with Dr. Ronnald Ramp. ? ?3.  Heart block ? -pt following with cardiology. ? ?4.  Constipation ? -discussed nature and pathophysiology and association with PD ? -discussed importance of hydration.  Pt does drink 100 oz of water per day ? -[place]o recipe has been given. ? -recommended daily colace ? -on miralax ? -GI recommended amitiza but he opted to stay on miralax for now ? -pt is on keto which contributes ? ? ? ?Subjective:  ? ?Douglas Johnston. was seen today in follow up for Parkinsons disease.  My previous records were reviewed prior to todays visit as well as outside records available to me.   No significant difference since our last visit.  Pt denies falls.  Pt denies lightheadedness, near syncope.  No hallucinations.  Mood has been good.   Still exercising a lot - pickleball 3-4 days per week.   He did have COVID since our last visit and was in the urgent care with that in February.  He had his annual physical March 16.  Those notes are reviewed.  No medications were changed. ? ?Current prescribed movement disorder medications: ?Carbidopa/levodopa 25/100, 1 tablet at 7 AM/11 AM/4 PM ? ? ?PREVIOUS MEDICATIONS:  propranolol 40 mg 3 times per day (bradycardia); primidone 100 mg twice daily (with Dr. Felecia Shelling); levodopa ? ?ALLERGIES:   ?Allergies  ?Allergen Reactions  ? Tessalon [Benzonatate] Other (See Comments)  ?  Inc cough, wheeze, SOB  ? ? ?CURRENT  MEDICATIONS:  ?Outpatient Encounter Medications as of 05/12/2021  ?Medication Sig  ? alclomethasone (ACLOVATE) 0.05 % cream Apply 1 application topically daily.   ? ALLEGRA ALLERGY 180 MG tablet SMARTSIG:1 By Mouth  ? amLODipine (NORVASC) 5 MG tablet TAKE 1&1/2 TABLETS ONCE DAILY.  ? bifidobacterium infantis (ALIGN) capsule SMARTSIG:1 By Mouth  ? carbidopa-levodopa (SINEMET IR) 25-100 MG tablet Take 1 tablet by mouth 3 (three) times daily.  ? Multiple Vitamin (MULTIVITAMIN) tablet Take 1 tablet by mouth daily.  ? Multiple Vitamins-Minerals (CENTRUM SILVER 50+MEN) TABS SMARTSIG:1 By Mouth  ? polyethylene glycol (MIRALAX / GLYCOLAX) 17 g packet Take 17 g by mouth daily.  ? Probiotic Product (PROBIOTIC FORMULA PO) Take 1 capsule by mouth daily. Align  ? triamcinolone (KENALOG) 0.1 % SMARTSIG:1 Application Topical 2-3 Times Daily  ? ?No facility-administered encounter medications on file as of 05/12/2021.  ? ? ?Objective:  ? ?PHYSICAL EXAMINATION:   ? ?VITALS:   ?Vitals:  ? 05/12/21 1256  ?BP: 121/78  ?Pulse: 66  ?SpO2: 98%  ?Weight: 163 lb (73.9 kg)  ?Height: 6' (1.829 m)  ? ? ? ? ?GEN:  The patient appears stated age and is in NAD. ?HEENT:  Normocephalic, atraumatic.  The mucous membranes are moist. The superficial temporal arteries are without ropiness or tenderness. ?CV:  RRR ?Lungs:  CTAB ?Neck:  no bruits ? ?Neurological examination: ? ?Orientation: The patient is alert and oriented x3. ?Cranial nerves: There is  good facial symmetry without facial hypomimia. The speech is fluent and clear. Soft palate rises symmetrically and there is no tongue deviation. Hearing is intact to conversational tone. ?Sensation: Sensation is intact to light touch throughout ?Motor: Strength is at least antigravity x4. ? ?Movement examination: ?Tone: There is normal tone UE/LE ?Abnormal movements: there is mild LUE rest tremor ?Coordination:  There is decremation with RAM's, only with the action of "turning in a lightbulb" (same as  previous) ?Gait and Station: The patient has no difficulty arising out of a deep-seated chair without the use of the hands. The patient's stride length is good.   ? ?I have reviewed and interpreted the following labs independently ? ?  Chemistry   ?   ?Component Value Date/Time  ? NA 137 03/24/2021 1008  ? NA 141 04/03/2019 0924  ? K 3.9 03/24/2021 1008  ? CL 101 03/24/2021 1008  ? CO2 31 03/24/2021 1008  ? BUN 20 03/24/2021 1008  ? BUN 20 04/03/2019 0924  ? CREATININE 0.80 03/24/2021 1008  ?    ?Component Value Date/Time  ? CALCIUM 9.3 03/24/2021 1008  ? ALKPHOS 59 03/24/2021 1008  ? AST 19 03/24/2021 1008  ? ALT 5 03/24/2021 1008  ? BILITOT 1.1 03/24/2021 1008  ?  ? ? ? ?Lab Results  ?Component Value Date  ? WBC 6.3 03/24/2021  ? HGB 13.2 03/24/2021  ? HCT 39.4 03/24/2021  ? MCV 94.6 03/24/2021  ? PLT 237.0 03/24/2021  ? ? ?Lab Results  ?Component Value Date  ? TSH 3.94 03/24/2021  ? ? ?Cc:  Binnie Rail, MD ? ?

## 2021-05-12 ENCOUNTER — Encounter: Payer: Self-pay | Admitting: Neurology

## 2021-05-12 ENCOUNTER — Ambulatory Visit: Payer: Medicare Other | Admitting: Neurology

## 2021-05-12 VITALS — BP 121/78 | HR 66 | Ht 72.0 in | Wt 163.0 lb

## 2021-05-12 DIAGNOSIS — G20 Parkinson's disease: Secondary | ICD-10-CM

## 2021-05-12 NOTE — Patient Instructions (Signed)
Local and Online Resources for Power over Parkinson's Group ?May 2023 ? ?LOCAL [place] PARKINSON'S GROUPS  ?Power over Parkinson's Group:   ?Power Over Parkinson's Patient Education Group will be Wednesday, May 10th-*Hybrid meting*- in person at [facility] location and via [facility] at 2:00 pm.   ?Upcoming Power over Parkinson's Meetings:  2nd Wednesdays of the month at 2 pm:  May 10th, June 14th, July 12th ?Contact Amy Marriott at [email or web address] if interested in participating in this group ?Parkinson's Care Partners Group:    3rd Mondays, Contact Misty Paladino ?Atypical Parkinsonian Patient Group:   4th Wednesdays, Contact Misty Paladino ?If you are interested in participating in these groups with Misty, please contact her directly for how to join those meetings.  Her contact information is [email or web address].   ? ?LOCAL EVENTS AND NEW OFFERINGS ?Moving Day [place]:  Saturday, May 6th, 9:30 am at [place], [place], [place]. Participate in Moving Day as a way to ?honor loved ones, raise funds, fight Parkinson's disease, and celebrate movement.?  Register today at [email or web address] ?[place]!  Play [place]!  Join [place] for home game for a fun evening to bring awareness of Parkinson's and raise funds for our Movement Disorder Funds. Rescheduled to May 11th  6:30 pm [place]. To purchase tickets:  [email or web address] ?Parkinson's T-shirts for sale!  Designed by a local group member, with funds going to [employer].  $25.00  Contact Misty to purchase  ?New PWR! Moves [employer] Instructor-Led Class offering at [employer]!  Wednesdays 1-2 pm, starting April 12th.   Contact Bryson Dames, Acupuncturist at [employer].  [email or web address] [email or web address] ? ?ONLINE EDUCATION AND SUPPORT ?[place]:  www.[email or web address] ?PD Health at Home continues:  Mindfulness  Mondays, Wellness Wednesdays, Fitness Fridays  ?Upcoming Education:  ?Understanding Gene and Cell-Based Therapies in Parkinson's.  Wednesday, May 10th at 1:00 pm ?Additional Education offerings virtually through their website-upcoming topics include Palliative Care/Hospice and PD, Sleep and PD ?Register for expert briefings Cytogeneticist) at [email or web address] ?Please check out their website to sign up for emails and see their full online offerings ? ? ?[place]:  [email or web address]  ?Third Thursday Webinars:  On the third Thursday of every month at 12 p.m. ET, join our free live webinars to learn about various aspects of living with Parkinson's disease and our work to speed medical breakthroughs. ?Upcoming Webinar: Get Moving: Exercising for a Healthy Brain.  Thursday, May 18th  at  12 noon. ?Check out additional information on their website to see their full online offerings ? ?[place]:  www.[email or web address] ?Upcoming Webinar:   Stay tuned ?Webinar Series:  Living with Parkinson's Meetup.   Third Thursdays each month, 3 pm ?Care Partner Monthly Meetup.  With Robin Searing Phinney.  First Tuesday of each month, 2 pm ?Check out additional information to Live Well Today on their website ? ?Parkinson and Movement Disorders (PMD) Alliance:  www.pmdalliance.org ?[employer] Online:  Online Education Events ?Sign up for emails, which are sent weekly to give you updates on programming and online offerings ? ?Parkinson's Association of the [place]:  www.parkinsonassociation.org ?Information on online support groups, education events, and online exercises including Yoga, Parkinson's exercises and more-LOTS of information on links to PD resources and online events ?Virtual Support Group through [employer] of the [place]; next one is scheduled for Wednesday, May 3rd at 2 pm. (These are typically  scheduled for the 1st Wednesday of the month at 2 pm).  Visit website for details. ?MOVEMENT AND  EXERCISE OPPORTUNITIES ?Parkinson's DRUMMING Classes/Music Therapy with Doylene Canning:  This is a returning class and it's FREE!  2nd Mondays, continuing May 8th, 11:00 at the [place].  Contact *Misty Taylor-Paladino at [email or web address] or Doylene Canning at [phone] or [email or web address]  ?PWR! Moves Classes at [place].  Wednesdays 10 and 11 am.   Contact Amy Marriott, PT [email or web address] if interested. ?NEW PWR! Moves Class offering at [employer].  Wednesdays 1-2 pm, starting April 12th.  Contact Bryson Dames, Acupuncturist at [employer].  [email or web address] [email or web address] ?Here is a link to the PWR!Moves classes on Zoom from [place] - Daily Mon-Sat at 10:00. Via Zoom, FREE and open to all.  There is also a link below via Facebook if you use that platform. ? ?[email or web address].si ?https://www.PrepaidParty.no ? ?Parkinson's Wellness Recovery (PWR! Moves)  www.pwr4life.org ?Info on the PWR! Virtual Experience:  You will have access to our expertise through self-assessment, guided plans that start with the PD-specific fundamentals, educational content, tips, Q&A with an expert, and a growing Art therapist of PD-specific pre-recorded and live exercise classes of varying types and intensity - both physical and cognitive! If that is not enough, we offer 1:1 wellness consultations (in-person or virtual) to personalize your PWR! Research scientist (medical).  ?[employer] Fridays:  ?As part of the PD Health @ Home program, this free video series focuses each week on one aspect of fitness designed to support people living with  Parkinson's.  These weekly videos highlight the [place] recent fitness guidelines for people with Parkinson's disease. ?www.KVTVnet.com.cy ?Dance for PD website is offering free, live-stream classes throughout the week, as well as links to [employer] of classes:  https://danceforparkinsons.org/ ?Virtual dance and Pilates for Parkinson's classes: Click on the Community Tab> Parkinson's Movement Initiative Tab.  To register for classes and for more information, visit www.SeekAlumni.co.za and click the ?community? tab.  ?[employer] Parkinson's Cycling Classes  ?[employer]:  Thursdays @ Noon-Live classes at [employer] ([employer] at [email or web address] or [phone]) ?[employer]: Virtual Classes Mondays and Thursdays Jeanette Caprice classes Tuesday, Wednesday and Thursday (contact [place] at [email or web address]  or [phone]) ?[employer] ?Varied levels of classes are offered Mondays, Tuesdays and Thursdays at [employer].  ?Stretching with Verdis Frederickson weekly class is also offered for people with Parkinson's ?To observe a class or for more information, call [phone] or email Hezzie Bump at [email or web address] .[email or web address] ?ADDITIONAL SUPPORT AND RESOURCES ?[employer]:Online Caregiver Education Opportunities:  www.well-springsolutions.org/caregiver-education/caregiver-support-group.  You may also contact Vickki Muff at [email or web address] or [phone].    ?Well-Spring Navigator:  Just1Navigator program, a free service to help individuals and families through the journey of determining care for older adults.  The ?Navigator? is a [phone], Education officer, museum, who will speak with a prospective client and/or loved ones to provide an assessment of the situation and a set of recommendations for a personalized care plan -- all free of charge, and whether Well-Spring [employer]  offers the needed service or not. If the need is not a service we provide, we are well-connected with reputable programs in town that we can refer you to.  www.well-springsolutions.org or to speak with the Navigator,

## 2021-07-21 ENCOUNTER — Other Ambulatory Visit: Payer: Self-pay

## 2021-07-21 ENCOUNTER — Other Ambulatory Visit: Payer: Self-pay | Admitting: Internal Medicine

## 2021-07-21 ENCOUNTER — Telehealth: Payer: Self-pay

## 2021-07-21 MED ORDER — AMLODIPINE BESYLATE 5 MG PO TABS: ORAL_TABLET | ORAL | 0 refills | Status: DC

## 2021-07-21 NOTE — Telephone Encounter (Signed)
Script sent in and patient notified to check with pharmacy for pick up.

## 2021-07-21 NOTE — Telephone Encounter (Signed)
Pt is requesting a short fill for: amLODipine (NORVASC) 5 MG tablet  Pharmacy: [id number] - [place] is out of town in [place] and is in need of his BP medication.  Please advise

## 2021-08-07 ENCOUNTER — Other Ambulatory Visit: Payer: Self-pay | Admitting: Neurology

## 2021-08-07 DIAGNOSIS — G20 Parkinson's disease: Secondary | ICD-10-CM

## 2021-09-01 DIAGNOSIS — Z961 Presence of intraocular lens: Secondary | ICD-10-CM | POA: Diagnosis not present

## 2021-09-15 ENCOUNTER — Other Ambulatory Visit: Payer: Self-pay | Admitting: Internal Medicine

## 2021-10-03 DIAGNOSIS — M19012 Primary osteoarthritis, left shoulder: Secondary | ICD-10-CM | POA: Diagnosis not present

## 2021-10-17 ENCOUNTER — Other Ambulatory Visit: Payer: Self-pay | Admitting: Orthopedic Surgery

## 2021-10-17 DIAGNOSIS — M19012 Primary osteoarthritis, left shoulder: Secondary | ICD-10-CM | POA: Diagnosis not present

## 2021-10-17 DIAGNOSIS — M25512 Pain in left shoulder: Secondary | ICD-10-CM

## 2021-10-19 ENCOUNTER — Telehealth: Payer: Self-pay | Admitting: Cardiovascular Disease

## 2021-10-19 DIAGNOSIS — L821 Other seborrheic keratosis: Secondary | ICD-10-CM | POA: Diagnosis not present

## 2021-10-19 DIAGNOSIS — D1801 Hemangioma of skin and subcutaneous tissue: Secondary | ICD-10-CM | POA: Diagnosis not present

## 2021-10-19 DIAGNOSIS — C44612 Basal cell carcinoma of skin of right upper limb, including shoulder: Secondary | ICD-10-CM | POA: Diagnosis not present

## 2021-10-19 DIAGNOSIS — L812 Freckles: Secondary | ICD-10-CM | POA: Diagnosis not present

## 2021-10-19 DIAGNOSIS — L570 Actinic keratosis: Secondary | ICD-10-CM | POA: Diagnosis not present

## 2021-10-19 DIAGNOSIS — Z85828 Personal history of other malignant neoplasm of skin: Secondary | ICD-10-CM | POA: Diagnosis not present

## 2021-10-19 NOTE — Telephone Encounter (Signed)
   Pre-operative Risk Assessment    Patient Name: Douglas Johnston.  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 Tamera Punt  Surgeon's Group or Practice Name:  [facility] Phone number:  [phone] Fax number:  [phone]   Type of Clearance Requested:   - Medical  - Pharmacy:  Hold        Type of Anesthesia:  Not Indicated   Additional requests/questions:    SignedMilbert Coulter   10/19/2021, 1:07 PM

## 2021-10-19 NOTE — Telephone Encounter (Signed)
   Name: Ledarrius Beauchaine.  DOB: 04/09/1945  MRN: [id number]  Primary Cardiologist: None  Chart reviewed as part of pre-operative protocol coverage. Because of JAMELL LAYMON Jr.'s past medical history and time since last visit, he will require a follow-up in-office visit in order to better assess preoperative cardiovascular risk.  Pre-op covering staff: - Please schedule appointment and call patient to inform them. If patient already had an upcoming appointment within acceptable timeframe, please add "pre-op clearance" to the appointment notes so provider is aware. - Please contact requesting surgeon's office via preferred method (i.e, phone, fax) to inform them of need for appointment prior to surgery.  Murray Hodgkins, NP  10/19/2021, 5:58 PM

## 2021-10-20 NOTE — Telephone Encounter (Signed)
I s/w the pt and he tells me that he had stated to Dr. Bettina Gavia office that he did not want to do his surgery until after Thanksgiving sometime. Pt would like to keep his appt with Dr. Johnsie Cancel as planned 12/07/21. Pre op clearance needed has been added to appt notes.   Pt asked me to let Dr. Tamera Punt office know he is going to see his cardiologist first as planned.

## 2021-10-31 ENCOUNTER — Encounter: Payer: Self-pay | Admitting: Internal Medicine

## 2021-10-31 ENCOUNTER — Encounter: Payer: Self-pay | Admitting: Neurology

## 2021-11-01 ENCOUNTER — Ambulatory Visit (INDEPENDENT_AMBULATORY_CARE_PROVIDER_SITE_OTHER): Payer: Medicare Other

## 2021-11-01 VITALS — Ht 72.0 in | Wt 160.0 lb

## 2021-11-01 DIAGNOSIS — Z0000 Encounter for general adult medical examination without abnormal findings: Secondary | ICD-10-CM

## 2021-11-01 NOTE — Patient Instructions (Signed)
Douglas Johnston , Thank you for taking time to come for your Medicare Wellness Visit. I appreciate your ongoing commitment to your health goals. Please review the following plan we discussed and let me know if I can assist you in the future.   These are the goals we discussed:  Goals      Client understands the importance of follow-up with providers by attending scheduled visits     My goal is to have left shoulder replacement surgery.        This is a list of the screening recommended for you and due dates:  Health Maintenance  Topic Date Due   Zoster (Shingles) Vaccine (1 of 2) Never done   Tetanus Vaccine  03/25/2022*   COVID-19 Vaccine (7 - Pfizer series) 02/05/2022   Medicare Annual Wellness Visit  12/02/2022   Pneumonia Vaccine  Completed   Flu Shot  Completed   Hepatitis C Screening: USPSTF Recommendation to screen - Ages 18-79 yo.  Completed   HPV Vaccine  Aged Out   Colon Cancer Screening  Discontinued  *Topic was postponed. The date shown is not the original due date.    Advanced directives: Yes; Please bring a copy of your health care power of attorney and living will to the office at your convenience.  Conditions/risks identified: Yes  Next appointment: Follow up in one year for your annual wellness visit.   Preventive Care 43 Years and Older, Male  Preventive care refers to lifestyle choices and visits with your health care provider that can promote health and wellness. What does preventive care include? A yearly physical exam. This is also called an annual well check. Dental exams once or twice a year. Routine eye exams. Ask your health care provider how often you should have your eyes checked. Personal lifestyle choices, including: Daily care of your teeth and gums. Regular physical activity. Eating a healthy diet. Avoiding tobacco and drug use. Limiting alcohol use. Practicing safe sex. Taking low doses of aspirin every day. Taking vitamin and mineral  supplements as recommended by your health care provider. What happens during an annual well check? The services and screenings done by your health care provider during your annual well check will depend on your age, overall health, lifestyle risk factors, and family history of disease. Counseling  Your health care provider may ask you questions about your: Alcohol use. Tobacco use. Drug use. Emotional well-being. Home and relationship well-being. Sexual activity. Eating habits. History of falls. Memory and ability to understand (cognition). Work and work Statistician. Screening  You may have the following tests or measurements: Height, weight, and BMI. Blood pressure. Lipid and cholesterol levels. These may be checked every 5 years, or more frequently if you are over 21 years old. Skin check. Lung cancer screening. You may have this screening every year starting at age 68 if you have a 30-pack-year history of smoking and currently smoke or have quit within the past 15 years. Fecal occult blood test (FOBT) of the stool. You may have this test every year starting at age 31. Flexible sigmoidoscopy or colonoscopy. You may have a sigmoidoscopy every 5 years or a colonoscopy every 10 years starting at age 57. Prostate cancer screening. Recommendations will vary depending on your family history and other risks. Hepatitis C blood test. Hepatitis B blood test. Sexually transmitted disease (STD) testing. Diabetes screening. This is done by checking your blood sugar (glucose) after you have not eaten for a while (fasting). You may have this  done every 1-3 years. Abdominal aortic aneurysm (AAA) screening. You may need this if you are a current or former smoker. Osteoporosis. You may be screened starting at age 64 if you are at high risk. Talk with your health care provider about your test results, treatment options, and if necessary, the need for more tests. Vaccines  Your health care provider  may recommend certain vaccines, such as: Influenza vaccine. This is recommended every year. Tetanus, diphtheria, and acellular pertussis (Tdap, Td) vaccine. You may need a Td booster every 10 years. Zoster vaccine. You may need this after age 29. Pneumococcal 13-valent conjugate (PCV13) vaccine. One dose is recommended after age 1. Pneumococcal polysaccharide (PPSV23) vaccine. One dose is recommended after age 11. Talk to your health care provider about which screenings and vaccines you need and how often you need them. This information is not intended to replace advice given to you by your health care provider. Make sure you discuss any questions you have with your health care provider. Document Released: 01/22/2015 Document Revised: 09/15/2015 Document Reviewed: 10/27/2014 Elsevier Interactive Patient Education  2017 [place] Prevention in the Home Falls can cause injuries. They can happen to people of all ages. There are many things you can do to make your home safe and to help prevent falls. What can I do on the outside of my home? Regularly fix the edges of walkways and driveways and fix any cracks. Remove anything that might make you trip as you walk through a door, such as a raised step or threshold. Trim any bushes or trees on the path to your home. Use bright outdoor lighting. Clear any walking paths of anything that might make someone trip, such as rocks or tools. Regularly check to see if handrails are loose or broken. Make sure that both sides of any steps have handrails. Any raised decks and porches should have guardrails on the edges. Have any leaves, snow, or ice cleared regularly. Use sand or salt on walking paths during winter. Clean up any spills in your garage right away. This includes oil or grease spills. What can I do in the bathroom? Use night lights. Install grab bars by the toilet and in the tub and shower. Do not use towel bars as grab bars. Use  non-skid mats or decals in the tub or shower. If you need to sit down in the shower, use a plastic, non-slip stool. Keep the floor dry. Clean up any water that spills on the floor as soon as it happens. Remove soap buildup in the tub or shower regularly. Attach bath mats securely with double-sided non-slip rug tape. Do not have throw rugs and other things on the floor that can make you trip. What can I do in the bedroom? Use night lights. Make sure that you have a light by your bed that is easy to reach. Do not use any sheets or blankets that are too big for your bed. They should not hang down onto the floor. Have a firm chair that has side arms. You can use this for support while you get dressed. Do not have throw rugs and other things on the floor that can make you trip. What can I do in the kitchen? Clean up any spills right away. Avoid walking on wet floors. Keep items that you use a lot in easy-to-reach places. If you need to reach something above you, use a strong step stool that has a grab bar. Keep electrical cords out of  the way. Do not use floor polish or wax that makes floors slippery. If you must use wax, use non-skid floor wax. Do not have throw rugs and other things on the floor that can make you trip. What can I do with my stairs? Do not leave any items on the stairs. Make sure that there are handrails on both sides of the stairs and use them. Fix handrails that are broken or loose. Make sure that handrails are as long as the stairways. Check any carpeting to make sure that it is firmly attached to the stairs. Fix any carpet that is loose or worn. Avoid having throw rugs at the top or bottom of the stairs. If you do have throw rugs, attach them to the floor with carpet tape. Make sure that you have a light switch at the top of the stairs and the bottom of the stairs. If you do not have them, ask someone to add them for you. What else can I do to help prevent falls? Wear  shoes that: Do not have high heels. Have rubber bottoms. Are comfortable and fit you well. Are closed at the toe. Do not wear sandals. If you use a stepladder: Make sure that it is fully opened. Do not climb a closed stepladder. Make sure that both sides of the stepladder are locked into place. Ask someone to hold it for you, if possible. Clearly mark and make sure that you can see: Any grab bars or handrails. First and last steps. Where the edge of each step is. Use tools that help you move around (mobility aids) if they are needed. These include: Canes. Walkers. Scooters. Crutches. Turn on the lights when you go into a dark area. Replace any light bulbs as soon as they burn out. Set up your furniture so you have a clear path. Avoid moving your furniture around. If any of your floors are uneven, fix them. If there are any pets around you, be aware of where they are. Review your medicines with your doctor. Some medicines can make you feel dizzy. This can increase your chance of falling. Ask your doctor what other things that you can do to help prevent falls. This information is not intended to replace advice given to you by your health care provider. Make sure you discuss any questions you have with your health care provider. Document Released: 10/22/2008 Document Revised: 06/03/2015 Document Reviewed: 01/30/2014 Elsevier Interactive Patient Education  2017 [employer].

## 2021-11-01 NOTE — Progress Notes (Signed)
Virtual Visit via Telephone Note  I connected with  Douglas Johnston. on 11/01/21 at 10:00 AM EDT by telephone and verified that I am speaking with the correct person using two identifiers.  Location: Patient: Home Provider: [facility] Persons participating in the virtual visit: patient/Nurse Health Advisor   I discussed the limitations, risks, security and privacy concerns of performing an evaluation and management service by telephone and the availability of in person appointments. The patient expressed understanding and agreed to proceed.  Interactive audio and video telecommunications were attempted between this nurse and patient, however failed, due to patient having technical difficulties OR patient did not have access to video capability.  We continued and completed visit with audio only.  Some vital signs may be absent or patient reported.   Sheral Flow, LPN  Subjective:   Douglas Johnston. is a 76 y.o. male who presents for Medicare Annual/Subsequent preventive examination.  Review of Systems     Cardiac Risk Factors include: advanced age (>16mn, >>59women);dyslipidemia;family history of premature cardiovascular disease;male gender;hypertension     Objective:    Today's Vitals   11/01/21 1001  Weight: 160 lb (72.6 kg)  Height: 6' (1.829 m)   Body mass index is 21.7 kg/m.     11/01/2021   10:03 AM 05/12/2021   12:56 PM 11/04/2020    2:36 PM 10/19/2020    2:28 PM 09/28/2020    3:02 PM 05/06/2020    1:45 PM 11/24/2019    1:57 PM  Advanced Directives  Does Patient Have a Medical Advance Directive? Yes Yes  Yes Yes Yes Yes  Type of AParamedicof [place] will Living will Living will;Healthcare Power of [place]Living will;Out of facility DNR (pink MOST or yellow form) [place]ake of [place] will  Does patient want to make  changes to medical advance directive?    No - Patient declined     Copy of HDanein Chart? No - copy requested   No - copy requested       Current Medications (verified) Outpatient Encounter Medications as of 11/01/2021  Medication Sig   alclomethasone (ACLOVATE) 0.05 % cream Apply 1 application topically daily.    ALLEGRA ALLERGY 180 MG tablet SMARTSIG:1 By Mouth   amLODipine (NORVASC) 5 MG tablet TAKE 1 & 1/2 TABLETS ONCE DAILY   bifidobacterium infantis (ALIGN) capsule SMARTSIG:1 By Mouth   carbidopa-levodopa (SINEMET IR) 25-100 MG tablet Take 1 tablet by mouth 3 (three) times daily.   Multiple Vitamin (MULTIVITAMIN) tablet Take 1 tablet by mouth daily.   Multiple Vitamins-Minerals (CENTRUM SILVER 50+MEN) TABS SMARTSIG:1 By Mouth   polyethylene glycol (MIRALAX / GLYCOLAX) 17 g packet Take 17 g by mouth daily.   Probiotic Product (PROBIOTIC FORMULA PO) Take 1 capsule by mouth daily. Align   triamcinolone (KENALOG) 0.1 % SMARTSIG:1 Application Topical 2-3 Times Daily   No facility-administered encounter medications on file as of 11/01/2021.    Allergies (verified) Tessalon [benzonatate]   History: Past Medical History:  Diagnosis Date   Arthritis    LEFT shoulder   Bowel habit changes 06/2016   on Ketogenic diet/ does not go to the BR but every 3 days   BPH (benign prostatic hyperplasia)    Cataract 2015   bilateral sx   Diverticulosis of colon 2008   External hemorrhoids    GERD (gastroesophageal reflux disease)    with certain  foods   Gilbert's syndrome    Heart murmur    History of anesthesia complications    Per pt, gets very light-headed past sedation/ fainted 1 time!   Hyperlipidemia    on meds   Hypertension    on meds   Skin cancer    X2 in [place]; now seeing Dr Georgia Duff   Past Surgical History:  Procedure Laterality Date   [place]- open heart surgery   CATARACT EXTRACTION  Bilateral 07/2013   bilateral eyes; Dr Thyra Breed, [place]  2008   [facility]   COLONOSCOPY  2017   JMP-MAC-2 day suprep(good)-polyps   INGUINAL HERNIA REPAIR Left 2006   [place]     as child   WISDOM TOOTH EXTRACTION     Family History  Problem Relation Age of Onset   Hypertension Mother    Other Mother        intestine blockage-burst   Emphysema Father        smoker   Cancer Neg Hx    Diabetes Neg Hx    Heart disease Neg Hx    Stroke Neg Hx    Colon cancer Neg Hx    Colon polyps Neg Hx    Stomach cancer Neg Hx    Rectal cancer Neg Hx    Social History   Socioeconomic History   Marital status: Married    Spouse name: Douglas Johnston   Number of children: 0   Years of education: BA   Highest education level: Not on file  Occupational History   Occupation: Retired  Tobacco Use   Smoking status: Never   Smokeless tobacco: Never  Vaping Use   Vaping Use: Never used  Substance and Sexual Activity   Alcohol use: Not Currently    Comment: special occasions   Drug use: No   Sexual activity: Yes  Other Topics Concern   Not on file  Social History Narrative      Caffeine use: coffee daily   Right handed    Social Determinants of Health   Financial Resource Strain: Low Risk  (11/01/2021)   Overall Financial Resource Strain (CARDIA)    Difficulty of Paying Living Expenses: Not hard at all  Food Insecurity: No Food Insecurity (11/01/2021)   Hunger Vital Sign    Worried About Running Out of Food in the Last Year: Never true    Ran Out of Food in the Last Year: Never true  Transportation Needs: No Transportation Needs (11/01/2021)   PRAPARE - Hydrologist (Medical): No    Lack of Transportation (Non-Medical): No  Physical Activity: Sufficiently Active (11/01/2021)   Exercise Vital Sign    Days of Exercise per Week: 5 days    Minutes of Exercise  per Session: 120 min  Stress: No Stress Concern Present (11/01/2021)   [place]    Feeling of Stress : Not at all  Social Connections: [place] (11/01/2021)   Social Connection and Isolation Panel [NHANES]    Frequency of Communication with Friends and Family: More than three times a week    Frequency of Social Gatherings with Friends and Family: More than three times a week    Attends Religious Services: More than 4 times per year  Active Member of Clubs or Organizations: Yes    Attends Archivist Meetings: More than 4 times per year    Marital Status: Married    Tobacco Counseling Counseling given: Not Answered   Clinical Intake:  Pre-visit preparation completed: Yes  Pain : No/denies pain     BMI - recorded: 21.7 Nutritional Status: BMI of 19-24  Normal Nutritional Risks: None Diabetes: No  How often do you need to have someone help you when you read instructions, pamphlets, or other written materials from your doctor or pharmacy?: 1 - Never What is the last grade level you completed in school?: HSG; Bachelor's Degree  Diabetic? no  Interpreter Needed?: No  Information entered by :: Lisette Abu, LPN.   Activities of Daily Living    11/01/2021   10:16 AM  In your present state of health, do you have any difficulty performing the following activities:  Hearing? 0  Vision? 0  Difficulty concentrating or making decisions? 0  Walking or climbing stairs? 0  Dressing or bathing? 0  Doing errands, shopping? 0  Preparing Food and eating ? N  Using the Toilet? N  In the past six months, have you accidently leaked urine? N  Do you have problems with loss of bowel control? N  Managing your Medications? N  Managing your Finances? N  Housekeeping or managing your Housekeeping? N    Patient Care Team: Binnie Rail, MD as PCP - General (Internal Medicine) Marilynne Halsted, MD as Referring Physician (Ophthalmology)  Indicate any recent Medical Services you may have received from other than [facility] providers in the past year (date may be approximate).     Assessment:   This is a routine wellness examination for [place].  Hearing/Vision screen Hearing Screening - Comments:: Patient wears hearing aids. Vision Screening - Comments:: Wears rx glasses - up to date with routine eye exams with Gretta Cool, MD.   Dietary issues and exercise activities discussed: Current Exercise Habits: Home exercise routine;Structured exercise class, Type of exercise: walking;Other - see comments (pickle ball 4 times a week for 2 hours each), Time (Minutes): 60, Frequency (Times/Week): 5, Weekly Exercise (Minutes/Week): 300, Intensity: Moderate, Exercise limited by: neurologic condition(s)   Goals Addressed             This Visit's Progress    Client understands the importance of follow-up with providers by attending scheduled visits       My goal is to have left shoulder replacement surgery.      Depression Screen    11/01/2021   10:14 AM 11/01/2021   10:07 AM 02/25/2021   10:09 AM 10/19/2020    2:33 PM 07/08/2019    9:03 AM 02/14/2019    7:41 AM 02/08/2018    7:47 AM  PHQ 2/9 Scores  PHQ - 2 Score 0 0 0 0 0 0 0    Fall Risk    11/01/2021   10:04 AM 05/12/2021   12:56 PM 02/25/2021   10:09 AM 11/04/2020    2:36 PM 10/19/2020    2:29 PM  Fall Risk   Falls in the past year? 0 0 0 0 0  Number falls in past yr: 0 0 0 0 0  Injury with Fall? 0 0 0 0 0  Risk for fall due to : No Fall Risks  No Fall Risks  No Fall Risks  Follow up Falls prevention discussed  Falls evaluation completed  Falls evaluation completed    FALL RISK  PREVENTION PERTAINING TO THE HOME:  Any stairs in or around the home? Yes  If so, are there any without handrails? No  Home free of loose throw rugs in walkways, pet beds, electrical cords, etc? Yes  Adequate lighting in your home  to reduce risk of falls? Yes   ASSISTIVE DEVICES UTILIZED TO PREVENT FALLS:  Life alert? No  Use of a cane, walker or w/c? No  Grab bars in the bathroom? No  Shower chair or bench in shower? Yes  Elevated toilet seat or a handicapped toilet? Yes   TIMED UP AND GO:  Was the test performed? No . Phone Visit  Cognitive Function:        11/01/2021   10:17 AM  6CIT Screen  What Year? 0 points  What month? 0 points  What time? 0 points  Count back from 20 0 points  Months in reverse 0 points  Repeat phrase 0 points  Total Score 0 points    Immunizations Immunization History  Administered Date(s) Administered   Fluad Quad(high Dose 65+) 10/10/2018, 10/10/2019, 10/15/2020   Influenza, High Dose Seasonal PF 10/16/2012, 10/01/2015, 10/10/2016, 10/30/2017   Influenza,inj,Quad PF,6+ Mos 11/28/2013   Influenza-Unspecified 11/02/2014, 10/06/2021   PFIZER Comirnaty(Gray Top)Covid-19 Tri-Sucrose Vaccine 10/06/2021   PFIZER(Purple Top)SARS-COV-2 Vaccination 02/13/2019, 03/11/2019, 10/23/2019, 04/20/2020, 10/04/2020   Pneumococcal Conjugate-13 12/25/2014   Pneumococcal Polysaccharide-23 12/08/2011   Td 02/14/2010   Zoster, Live 01/07/2014    TDAP status: Due, Education has been provided regarding the importance of this vaccine. Advised may receive this vaccine at local pharmacy or Health Dept. Aware to provide a copy of the vaccination record if obtained from local pharmacy or Health Dept. Verbalized acceptance and understanding.  Flu Vaccine status: Up to date  Pneumococcal vaccine status: Up to date  Covid-19 vaccine status: Completed vaccines  Qualifies for Shingles Vaccine? Yes   Zostavax completed Yes   Shingrix Completed?: No.    Education has been provided regarding the importance of this vaccine. Patient has been advised to call insurance company to determine out of pocket expense if they have not yet received this vaccine. Advised may also receive vaccine at local  pharmacy or Health Dept. Verbalized acceptance and understanding.  Screening Tests Health Maintenance  Topic Date Due   Zoster Vaccines- Shingrix (1 of 2) Never done   TETANUS/TDAP  03/25/2022 (Originally 02/15/2020)   COVID-19 Vaccine (7 - Pfizer series) 02/05/2022   Medicare Annual Wellness (AWV)  12/02/2022   Pneumonia Vaccine 12+ Years old  Completed   INFLUENZA VACCINE  Completed   Hepatitis C Screening  Completed   HPV VACCINES  Aged Out   COLONOSCOPY (Pts 45-34yr Insurance coverage will need to be confirmed)  Discontinued    Health Maintenance  Health Maintenance Due  Topic Date Due   Zoster Vaccines- Shingrix (1 of 2) Never done    Colorectal cancer screening: No longer required.   Lung Cancer Screening: (Low Dose CT Chest recommended if Age 78-80years, 30 pack-year currently smoking OR have quit w/in 15years.) does not qualify.   Lung Cancer Screening Referral: no  Additional Screening:  Hepatitis C Screening: does qualify; Completed 12/25/2014  Vision Screening: Recommended annual ophthalmology exams for early detection of glaucoma and other disorders of the eye. Is the patient up to date with their annual eye exam?  Yes  Who is the provider or what is the name of the office in which the patient attends annual eye exams? MGretta Cool MD. If pt is  not established with a provider, would they like to be referred to a provider to establish care? No .   Dental Screening: Recommended annual dental exams for proper oral hygiene  Community Resource Referral / Chronic Care Management: CRR required this visit?  No   CCM required this visit?  No      Plan:     I have personally reviewed and noted the following in the patient's chart:   Medical and social history Use of alcohol, tobacco or illicit drugs  Current medications and supplements including opioid prescriptions. Patient is not currently taking opioid prescriptions. Functional ability and  status Nutritional status Physical activity Advanced directives List of other physicians Hospitalizations, surgeries, and ER visits in previous 12 months Vitals Screenings to include cognitive, depression, and falls Referrals and appointments  In addition, I have reviewed and discussed with patient certain preventive protocols, quality metrics, and best practice recommendations. A written personalized care plan for preventive services as well as general preventive health recommendations were provided to patient.     Sheral Flow, LPN   [id number]   Nurse Notes: N/A

## 2021-11-04 ENCOUNTER — Other Ambulatory Visit: Payer: Self-pay | Admitting: Neurology

## 2021-11-04 DIAGNOSIS — G20A1 Parkinson's disease without dyskinesia, without mention of fluctuations: Secondary | ICD-10-CM

## 2021-11-14 NOTE — Progress Notes (Unsigned)
Assessment/Plan:   1.  Parkinsons Disease  -Continue carbidopa/levodopa 25/100, 1 tablet 3 times per day.  Told Douglas Johnston I may increase levodopa at next visit, but decided to hold off on that today given that Douglas Johnston blood pressure is just a little bit low.  -Douglas Johnston is doing great with exercise.  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  Given information to Douglas Johnston.  Douglas Johnston/Douglas Johnston state today, however, that they would like to travel back and forth for appointments.  -Douglas Johnston is optimized medically from a Parkinson's standpoint for Douglas Johnston left shoulder arthroplasty. Forms filled out.   2.  History of melanoma  -Douglas Johnston follows with dermatology.  -Douglas Johnston understands that Parkinson's also slightly increases risk for melanoma.  Douglas Johnston follows with Dr. Ronnald Ramp.  3.  Heart block  -pt following with cardiology.  4.  Low BP  -PCP decreasing amlodipine.  -Douglas Johnston keeping track of blood pressures.  Told Douglas Johnston to take them in various positions.  Discussed concept of permissive hypertension Parkinson's disease.    Subjective:   Douglas Johnston. was seen today in follow up for Parkinsons disease.  My previous records were reviewed prior to todays visit as well as outside records available to me.   Pt with Douglas Johnston who supplements hx.  Fairly stable since our last visit.  Douglas Johnston did contact me a week or 2 ago about the lowering of Douglas Johnston blood pressure and asked if levodopa was the cause.  I told Douglas Johnston that levodopa could contribute to it, but Douglas Johnston was on amlodipine, and the treatment would be to decrease the amlodipine, if able.  I told Douglas Johnston to follow-up with the prescribing provider.  Douglas Johnston did that and was told to decrease the amlodipine from 7.5 mg daily to 2.5 mg daily.  Douglas Johnston is planning on undergoing L shoulder sx next month.  No falls since last visit.  No hallucinations.  Douglas Johnston is playing  pickle ball faithfully for exercise.  Current prescribed movement disorder medications: Carbidopa/levodopa 25/100, 1 tablet at 7 AM/11 AM/4 PM   PREVIOUS MEDICATIONS:  propranolol 40 mg 3 times per day (bradycardia); primidone 100 mg twice daily (with Dr. Felecia Shelling); levodopa  ALLERGIES:   Allergies  Allergen Reactions   Tessalon [Benzonatate] Other (See Comments)    Inc cough, wheeze, SOB    CURRENT MEDICATIONS:  Outpatient Encounter Medications as of 11/15/2021  Medication Sig   alclomethasone (ACLOVATE) 0.05 % cream Apply 1 application topically daily.    ALLEGRA ALLERGY 180 MG tablet SMARTSIG:1 By Mouth   amLODipine (NORVASC) 5 MG tablet TAKE 1 & 1/2 TABLETS ONCE DAILY   bifidobacterium infantis (ALIGN) capsule SMARTSIG:1 By Mouth   carbidopa-levodopa (SINEMET IR) 25-100 MG tablet TAKE ONE TABLET BY MOUTH THREE TIMES DAILY   Multiple Vitamin (MULTIVITAMIN) tablet Take 1 tablet by mouth daily.   Multiple Vitamins-Minerals (CENTRUM SILVER 50+MEN) TABS SMARTSIG:1 By Mouth   polyethylene glycol (MIRALAX / GLYCOLAX) 17 g packet Take 17 g by mouth daily.   Probiotic Product (PROBIOTIC FORMULA PO) Take 1 capsule by mouth daily. Align   triamcinolone (KENALOG) 0.1 % SMARTSIG:1 Application Topical 2-3 Times Daily   No facility-administered encounter medications on file as of 11/15/2021.    Objective:   PHYSICAL EXAMINATION:    VITALS:   Vitals:   11/15/21 1305  BP: 126/84  Pulse: 64  SpO2: 98%  Weight: 162 lb (73.5 kg)  Height: '5\' 11"'$  (1.803 m)    GEN:  The Douglas Johnston appears stated age and is in NAD. HEENT:  Normocephalic, atraumatic.  The mucous membranes are moist. The superficial temporal arteries are without ropiness or tenderness. CV:  RRR Lungs:  CTAB Neck:  no bruits  Neurological examination:  Orientation: The Douglas Johnston is alert and oriented x3. Cranial nerves: There is good facial symmetry without facial hypomimia. The speech is fluent and clear. Soft palate rises  symmetrically and there is no tongue deviation. Hearing is intact to conversational tone. Sensation: Sensation is intact to light touch throughout Motor: Strength is at least antigravity x4.  Movement examination: Tone: There is normal tone UE/LE Abnormal movements: there is mild LUE rest tremor Coordination:  There is mild decremation with RAM's, with any form of RAMS, including alternating supination and pronation of the forearm, hand opening and closing, finger taps, heel taps and toe taps on the L Gait and Station: The Douglas Johnston has no difficulty arising out of a deep-seated chair without the use of the hands. The Douglas Johnston's stride length is good.    I have reviewed and interpreted the following labs independently    Chemistry      Component Value Date/Time   NA 137 03/24/2021 1008   NA 141 04/03/2019 0924   K 3.9 03/24/2021 1008   CL 101 03/24/2021 1008   CO2 31 03/24/2021 1008   BUN 20 03/24/2021 1008   BUN 20 04/03/2019 0924   CREATININE 0.80 03/24/2021 1008      Component Value Date/Time   CALCIUM 9.3 03/24/2021 1008   ALKPHOS 59 03/24/2021 1008   AST 19 03/24/2021 1008   ALT 5 03/24/2021 1008   BILITOT 1.1 03/24/2021 1008       Lab Results  Component Value Date   WBC 6.3 03/24/2021   HGB 13.2 03/24/2021   HCT 39.4 03/24/2021   MCV 94.6 03/24/2021   PLT 237.0 03/24/2021    Lab Results  Component Value Date   TSH 3.94 03/24/2021   Total time spent on today's visit was 31 minutes, including both face-to-face time and nonface-to-face time.  Time included that spent on review of records (prior notes available to me/labs/imaging if pertinent), discussing treatment and goals, answering Douglas Johnston's questions and coordinating care.  Cc:  Binnie Rail, MD

## 2021-11-14 NOTE — Patient Instructions (Signed)
Regarding upcoming surgery, I recommend:  -take your parkinsons medication the AM of surgery  -if medication for nausea is needed after surgery, I recommend zofran instead of phenergan as phenergan can make parkinsons worse  -asking your surgeon if it is possible if the surgery can be done under a local block or spinal anesthesia.  If not then general anesthesia is fine

## 2021-11-15 ENCOUNTER — Encounter: Payer: Self-pay | Admitting: Neurology

## 2021-11-15 ENCOUNTER — Ambulatory Visit: Payer: Medicare Other | Admitting: Neurology

## 2021-11-15 VITALS — BP 126/84 | HR 64 | Ht 71.0 in | Wt 162.0 lb

## 2021-11-15 DIAGNOSIS — G20A1 Parkinson's disease without dyskinesia, without mention of fluctuations: Secondary | ICD-10-CM

## 2021-11-15 DIAGNOSIS — G903 Multi-system degeneration of the autonomic nervous system: Secondary | ICD-10-CM | POA: Diagnosis not present

## 2021-11-21 ENCOUNTER — Ambulatory Visit
Admission: RE | Admit: 2021-11-21 | Discharge: 2021-11-21 | Disposition: A | Discharge status: Home / Self Care | Payer: Medicare Other | Source: Ambulatory Visit | Attending: Orthopedic Surgery | Admitting: Orthopedic Surgery

## 2021-11-21 DIAGNOSIS — M25512 Pain in left shoulder: Secondary | ICD-10-CM | POA: Diagnosis not present

## 2021-11-24 NOTE — Progress Notes (Signed)
Cardiology Office Note   Date:  12/07/2021   ID:  Douglas Hammond., DOB [birth year], MRN [id number]  PCP:  Binnie Rail, MD  Cardiologist:   Jenkins Rouge, MD   No chief complaint on file.      History of Present Illness: Douglas Zimmerle. is a 76 y.o. 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 Has been on BP meds for years. Tries to follow low sodium diet. Red wine seems to bring BP down. 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 Ineffective and started on norvasc.  He is active playing racquet ball 6 hours / week   Retired from [employer] from [place]  Married no children     Echo reviewed 07/19/16 EF 60-65% mild LVH Trivial MR/AR moderate LAE RV mildly dilated estimated PA 30 mmHg  Bubble study negative right to left shunt  Monitor 11/26/19 with NSR first degree periods of nocturnal wenkebach and PAF although DR Lovena Le did not think this was afib due to tremor   Myovue 07/13/16 no ischemia or infarct EF estimated 50% normal by echo see above   Seen by primary 02/14/19 and has developed tremor in left hand/arm Norvasc changed to propanolol Tended to run low HR in 50's before this Since has been diagnosed with Parkinson's and started on Sinemet and Primidone  Seen by Dr Jeffie Pollock 11/11/19 and worried that HR was in 30-40 range Told to stop beta blocker  Monitor 06/04/20 with 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 Before end of year Dr Tat has seen 11/15/21 and thought he was ok from Parkinson's Rx standpoint for surgery   Discussed  holding norvasc day before and morning of surgery as he is prone to orhtostasis with Parkinson's     Past Medical History:  Diagnosis Date   Arthritis    LEFT shoulder   Bowel habit changes 06/2016   on Ketogenic diet/ does not go to the BR but every 3 days   BPH (benign prostatic hyperplasia)    Cataract 2015   bilateral sx   Diverticulosis of colon 2008   External hemorrhoids    GERD (gastroesophageal reflux disease)    with certain foods   Gilbert's syndrome    Heart murmur    History of anesthesia complications    Per pt, gets very light-headed past sedation/ fainted 1 time!   Hyperlipidemia    on meds   Hypertension    on meds   Skin cancer    X2 in [place]; now seeing Dr Georgia Duff    Past Surgical History:  Procedure Laterality Date   [place]- open heart surgery   CATARACT EXTRACTION Bilateral 07/2013   bilateral eyes; Dr Thyra Breed, [place]  2008   [facility]   COLONOSCOPY  2017   JMP-MAC-2 day suprep(good)-polyps   INGUINAL HERNIA REPAIR Left 2006   [place]  [place] , [place]   TONSILLECTOMY     as child   WISDOM TOOTH EXTRACTION       Current Outpatient Medications  Medication Sig Dispense Refill   alclomethasone (ACLOVATE) 0.05 % cream Apply 1 application topically daily.      ALLEGRA ALLERGY 180 MG tablet SMARTSIG:1 By Mouth     amLODipine (NORVASC) 5 MG tablet TAKE 1 & 1/2 TABLETS ONCE DAILY (Patient taking differently: Per patient taking 1/2 tablet daily) 135 tablet 0   bifidobacterium infantis (ALIGN) capsule SMARTSIG:1 By Mouth     carbidopa-levodopa (SINEMET IR) 25-100 MG tablet TAKE ONE TABLET BY MOUTH THREE TIMES DAILY 270 tablet 0   Multiple Vitamins-Minerals (CENTRUM SILVER 50+MEN) TABS SMARTSIG:1 By Mouth     polyethylene glycol (MIRALAX / GLYCOLAX) 17 g packet Take 17 g by mouth daily.     Probiotic Product (PROBIOTIC FORMULA PO) Take 1  capsule by mouth daily. Align     triamcinolone (KENALOG) 0.1 % SMARTSIG:1 Application Topical 2-3 Times Daily     No current facility-administered medications for this visit.    Allergies:   Tessalon [benzonatate]    Social History:  The patient  reports that he has never smoked. He has never used smokeless tobacco. He reports that he does not currently use alcohol. He reports that he does not use drugs.   Family History:  The patient's family history includes Emphysema in his father; Hypertension in his mother; Other in his mother.    ROS:  Please see the history of present illness.   Otherwise, review of systems are positive for none.   All other systems are reviewed and negative.    PHYSICAL EXAM: VS:  BP (!) 130/90   Pulse 65   Ht 6' (1.829 m)   Wt 161 lb 3.2 oz (73.1 kg)   SpO2 95%   BMI 21.86 kg/m  , BMI Body mass index is 21.86 kg/m. Affect appropriate Healthy:  appears stated age 49: normal Neck supple with no adenopathy JVP normal no bruits no thyromegaly Lungs clear with no wheezing and good diaphragmatic motion Heart:  S1/S2 no murmur, no rub, gallop or click PMI normal Abdomen: benighn, BS positve, no tenderness, no AAA no bruit.  No HSM or HJR Distal pulses intact with no bruits No edema Neuro resting tremor in left hand and arm  Skin warm and dry No muscular weakness   EKG:   12/07/2021 SR rate 65 RBBB LAD no acute changes    Recent Labs: 03/24/2021: ALT 5; BUN 20; Creatinine, Ser 0.80; Hemoglobin 13.2; Platelets 237.0; Potassium 3.9; Sodium 137; TSH 3.94    Lipid Panel    Component Value Date/Time   CHOL 161 03/24/2021 1008   CHOL 186 01/07/2014 1137   TRIG 54.0 03/24/2021 1008   TRIG 64 01/07/2014 1137   HDL 60.60 03/24/2021 1008   HDL 56 01/07/2014 1137   CHOLHDL 3 03/24/2021 1008   VLDL 10.8 03/24/2021 1008   LDLCALC 90 03/24/2021 1008   LDLCALC 117 (H) 01/07/2014 1137      Wt Readings from Last 3 Encounters:  12/07/21 161 lb 3.2  oz (73.1 kg)  11/15/21 162 lb (73.5 kg)  11/01/21 160 lb (72.6 kg)      Other studies Reviewed: Additional studies/ records that were reviewed today include: Notes Dr Quay Burow labs and ECG .Echo 07/19/16 Myovue 07/13/16     ASSESSMENT AND PLAN:  1. HTN  continue norvasc avoid beta blockers and other AV nodal suppressers  2. ASD Repair intact by echo 07/07/16 negative bubble study  3. Varicosities:  F/u Dr Oneida Alar no phlebitis  4.Bradycardia with nocturnal AVWB f/u Dr Lovena Le defers PPM/Ablation at this time  5. Parkinson's: on  sinemet f/u neurology  Dr Tat continue Sinemet  6. Preoperative:  left shoulder. At higher but not prohibitive risk for respiratory complications and slow extubation with Parkinson's and bradycardia with Wenkebach and above his block documented if he were to have vagal episode with surgery/pain. Hold norvasc day before and morning of surgery   F/U Dr Lovena Le  F/U with me in a year   Jenkins Rouge

## 2021-12-07 ENCOUNTER — Encounter: Payer: Self-pay | Admitting: Cardiovascular Disease

## 2021-12-07 ENCOUNTER — Ambulatory Visit: Payer: Medicare Other | Attending: Cardiovascular Disease | Admitting: Cardiovascular Disease

## 2021-12-07 VITALS — BP 130/90 | HR 65 | Ht 72.0 in | Wt 161.2 lb

## 2021-12-07 DIAGNOSIS — R001 Bradycardia, unspecified: Secondary | ICD-10-CM | POA: Diagnosis not present

## 2021-12-07 DIAGNOSIS — I452 Bifascicular block: Secondary | ICD-10-CM | POA: Diagnosis not present

## 2021-12-07 DIAGNOSIS — Z01810 Encounter for preprocedural cardiovascular examination: Secondary | ICD-10-CM | POA: Diagnosis not present

## 2021-12-07 NOTE — Patient Instructions (Addendum)
Medication Instructions:  *If you need a refill on your cardiac medications before your next appointment, please call your pharmacy*  Lab Work: If you have labs (blood work) drawn today and your tests are completely normal, you will receive your results only by: [place] (if you have MyChart) OR A paper copy in the mail If you have any lab test that is abnormal or we need to change your treatment, we will call you to review the results.  Follow-Up: At [facility], you and your health needs are our priority.  As part of our continuing mission to provide you with exceptional heart care, we have created designated Provider Care Teams.  These Care Teams include your primary Cardiologist (physician) and Advanced Practice Providers (APPs -  Physician Assistants and Nurse Practitioners) who all work together to provide you with the care you need, when you need it.  We recommend signing up for the patient portal called "MyChart".  Sign up information is provided on this After Visit Summary.  MyChart is used to connect with patients for Virtual Visits (Telemedicine).  Patients are able to view lab/test results, encounter notes, upcoming appointments, etc.  Non-urgent messages can be sent to your provider as well.   To learn more about what you can do with MyChart, go to [email or web address].    Your next appointment:   1 year(s)  The format for your next appointment:   In Person  Provider:   Jenkins Rouge, MD      Important Information About Sugar

## 2021-12-19 ENCOUNTER — Other Ambulatory Visit: Payer: Self-pay | Admitting: Orthopedic Surgery

## 2021-12-21 NOTE — Progress Notes (Signed)
Sent message, via epic in basket, requesting orders in epic from surgeon.  

## 2021-12-22 NOTE — Patient Instructions (Addendum)
SURGICAL WAITING ROOM VISITATION Patients having surgery or a procedure may have no more than 2 support people in the waiting area - these visitors may rotate.   Children under the age of 65 must have an adult with them who is not the patient. If the patient needs to stay at the hospital during part of their recovery, the visitor guidelines for inpatient rooms apply. Pre-op nurse will coordinate an appropriate time for 1 support person to accompany patient in pre-op.  This support person may not rotate.    Please refer to the [facility] website for the visitor guidelines for Inpatients (after your surgery is over and you are in a regular room).      Your procedure is scheduled on: 12-29-21   Report to [facility] Main Entrance    Report to admitting at 5:45 AM   Call this number if you have problems the morning of surgery [phone]   Do not eat food :After Midnight.   After Midnight you may have the following liquids until 5:15 AM DAY OF SURGERY  Water Non-Citrus Juices (without pulp, NO RED) Carbonated Beverages Black Coffee (NO MILK/CREAM OR CREAMERS, sugar ok)  Clear Tea (NO MILK/CREAM OR CREAMERS, sugar ok) regular and decaf                             Plain Jell-O (NO RED)                                           Fruit ices (not with fruit pulp, NO RED)                                     Popsicles (NO RED)                                                               Sports drinks like Gatorade (NO RED)                   The day of surgery:  Drink ONE (1) Pre-Surgery G2 at 5:15 AM the morning of surgery. Drink in one sitting. Do not sip.  This drink was given to you during your hospital  pre-op appointment visit. Nothing else to drink after completing the Pre-Surgery G2           If you have questions, please contact your surgeon's office.   FOLLOW ANY ADDITIONAL PRE OP INSTRUCTIONS YOU RECEIVED FROM YOUR SURGEON'S OFFICE!!!     Oral Hygiene is also  important to reduce your risk of infection.                                    Remember - BRUSH YOUR TEETH THE MORNING OF SURGERY WITH YOUR REGULAR TOOTHPASTE   Do NOT smoke after Midnight   Take these medicines the morning of surgery with A SIP OF WATER Allegra Amlodipine Carbidopa-Lovodooa  You may not have any metal on your body including jewelry, and body piercing             Do not wear lotions, powders, cologne, or deodorant              Men may shave face and neck.   Do not bring valuables to the hospital. [place].   Contacts, dentures or bridgework may not be worn into surgery.   DO NOT [place]. PHARMACY WILL DISPENSE MEDICATIONS LISTED ON YOUR MEDICATION LIST TO YOU DURING YOUR ADMISSION [place]!    Patients discharged on the day of surgery will not be allowed to drive home.  Someone NEEDS to stay with you for the first 24 hours after anesthesia.   Special Instructions: Bring a copy of your healthcare power of attorney and living will documents the day of surgery if you haven't scanned them before.              Please read over the following fact sheets you were given: IF [place]  If you received a COVID test during your pre-op visit  it is requested that you wear a mask when out in public, stay away from anyone that may not be feeling well and notify your surgeon if you develop symptoms. If you test positive for Covid or have been in contact with anyone that has tested positive in the last 10 days please notify you surgeon.  [place]- Preparing for Total Shoulder Arthroplasty    Before surgery, you can play an important role. Because skin is not sterile, your skin needs to be as free of germs as possible. You can reduce the number of germs on your skin by using the following  products. Benzoyl Peroxide Gel Reduces the number of germs present on the skin Applied twice a day to shoulder area starting two days before surgery    ==================================================================  Please follow these instructions carefully:  BENZOYL PEROXIDE 5% GEL  Please do not use if you have an allergy to benzoyl peroxide.   If your skin becomes reddened/irritated stop using the benzoyl peroxide.  Starting two days before surgery, apply as follows: Apply benzoyl peroxide in the morning and at night. Apply after taking a shower. If you are not taking a shower clean entire shoulder front, back, and side along with the armpit with a clean wet washcloth.  Place a quarter-sized dollop on your shoulder and rub in thoroughly, making sure to cover the front, back, and side of your shoulder, along with the armpit.   2 days before ____ AM   ____ PM              1 day before ____ AM   ____ PM                         Do this twice a day for two days.  (Last application is the night before surgery, AFTER using the CHG soap as described below).  Do NOT apply benzoyl peroxide gel on the day of surgery.  [place] - Preparing for Surgery Before surgery, you can play an important role.  Because skin is not sterile, your skin needs to be as free of germs as possible.  You can reduce the number of germs on your skin by washing with  CHG (chlorahexidine gluconate) soap before surgery.  CHG is an antiseptic cleaner which kills germs and bonds with the skin to continue killing germs even after washing. Please DO NOT use if you have an allergy to CHG or antibacterial soaps.  If your skin becomes reddened/irritated stop using the CHG and inform your nurse when you arrive at Short Stay. Do not shave (including legs and underarms) for at least 48 hours prior to the first CHG shower.  You may shave your face/neck.  Please follow these instructions carefully:  1.  Shower with CHG Soap  the night before surgery and the  morning of surgery.  2.  If you choose to wash your hair, wash your hair first as usual with your normal  shampoo.  3.  After you shampoo, rinse your hair and body thoroughly to remove the shampoo.                             4.  Use CHG as you would any other liquid soap.  You can apply chg directly to the skin and wash.  Gently with a scrungie or clean washcloth.  5.  Apply the CHG Soap to your body ONLY FROM THE NECK DOWN.   Do   not use on face/ open                           Wound or open sores. Avoid contact with eyes, ears mouth and   genitals (private parts).                       Wash face,  Genitals (private parts) with your normal soap.             6.  Wash thoroughly, paying special attention to the area where your    surgery  will be performed.  7.  Thoroughly rinse your body with warm water from the neck down.  8.  DO NOT shower/wash with your normal soap after using and rinsing off the CHG Soap.                9.  Pat yourself dry with a clean towel.            10.  Wear clean pajamas.            11.  Place clean sheets on your bed the night of your first shower and do not  sleep with pets. Day of Surgery : Do not apply any lotions/deodorants the morning of surgery.  Please wear clean clothes to the hospital/surgery center.  FAILURE TO FOLLOW THESE INSTRUCTIONS MAY RESULT IN THE CANCELLATION OF YOUR SURGERY  PATIENT SIGNATURE_________________________________  NURSE SIGNATURE__________________________________  ________________________________________________________________________    Adam Phenix  An incentive spirometer is a tool that can help keep your lungs clear and active. This tool measures how well you are filling your lungs with each breath. Taking long deep breaths may help reverse or decrease the chance of developing breathing (pulmonary) problems (especially infection) following: A long period of time when you are unable  to move or be active. BEFORE THE PROCEDURE  If the spirometer includes an indicator to show your best effort, your nurse or respiratory therapist will set it to a desired goal. If possible, sit up straight or lean slightly forward. Try not to slouch. Hold the incentive spirometer in an upright position. INSTRUCTIONS  FOR USE  Sit on the edge of your bed if possible, or sit up as far as you can in bed or on a chair. Hold the incentive spirometer in an upright position. Breathe out normally. Place the mouthpiece in your mouth and seal your lips tightly around it. Breathe in slowly and as deeply as possible, raising the piston or the ball toward the top of the column. Hold your breath for 3-5 seconds or for as long as possible. Allow the piston or ball to fall to the bottom of the column. Remove the mouthpiece from your mouth and breathe out normally. Rest for a few seconds and repeat Steps 1 through 7 at least 10 times every 1-2 hours when you are awake. Take your time and take a few normal breaths between deep breaths. The spirometer may include an indicator to show your best effort. Use the indicator as a goal to work toward during each repetition. After each set of 10 deep breaths, practice coughing to be sure your lungs are clear. If you have an incision (the cut made at the time of surgery), support your incision when coughing by placing a pillow or rolled up towels firmly against it. Once you are able to get out of bed, walk around indoors and cough well. You may stop using the incentive spirometer when instructed by your caregiver.  RISKS AND COMPLICATIONS Take your time so you do not get dizzy or light-headed. If you are in pain, you may need to take or ask for pain medication before doing incentive spirometry. It is harder to take a deep breath if you are having pain. AFTER USE Rest and breathe slowly and easily. It can be helpful to keep track of a log of your progress. Your caregiver  can provide you with a simple table to help with this. If you are using the spirometer at home, follow these instructions: [place] IF:  You are having difficultly using the spirometer. You have trouble using the spirometer as often as instructed. Your pain medication is not giving enough relief while using the spirometer. You develop fever of 100.5 F (38.1 C) or higher. SEEK IMMEDIATE MEDICAL CARE IF:  You cough up bloody sputum that had not been present before. You develop fever of 102 F (38.9 C) or greater. You develop worsening pain at or near the incision site. MAKE SURE YOU:  Understand these instructions. Will watch your condition. Will get help right away if you are not doing well or get worse. Document Released: 05/08/2006 Document Revised: 03/20/2011 Document Reviewed: 07/09/2006 [facility] Patient Information 2014 [place], [place].   ________________________________________________________________________

## 2021-12-22 NOTE — Care Plan (Signed)
Ortho Bundle Case Management Note  Patient Details  Name: Douglas Johnston. MRN: [id number] Date of Birth: [birth year] spoke with patient. states that he will discharge to home with wife to assist. would like an ice machine. [place] referral made. will follow up on this. dischcarge instructions mailed to patient and discussed. states no further questions at this time                    DME Arranged:    DME Agency:     HH Arranged:    [place] Agency:     Additional Comments: Please contact me with any questions of if this plan should need to change.  Ladell Heads,  [place] [facility]ist  [phone] 12/22/2021, 11:57 AM

## 2021-12-22 NOTE — Progress Notes (Addendum)
COVID Vaccine Completed:  Yes  Date of COVID positive in last 90 days:  No  PCP - Billey Gosling, MD Cardiologist - Jenkins Rouge, MD Neurologist - Wells Guiles Tat, DO  Cardiac clearance (higher risk due to bradycardia and Parkinson's) in Epic dated 12-07-21 by Dr. Johnsie Cancel  Chest x-ray - 12-23-21 Epic EKG - 12-07-21 Epic Stress Test - 07-13-16 Epic ECHO - 07-19-16 Epic Cardiac Cath - N/A Pacemaker/ICD device last checked: Spinal Cord Stimulator: Long Term Monitor - 2022 Epic  Bowel Prep - N/A  Sleep Study - N/A CPAP -   Prediabetic  Fasting Blood Sugar -  Checks Blood Sugar - does not check   Last dose of GLP1 agonist-  N/A GLP1 instructions:  N/A   Last dose of SGLT-2 inhibitors-  N/A SGLT-2 instructions: N/A  Blood Thinner Instructions:  N/A Aspirin Instructions: Last Dose:  Activity level:  Can go up a flight of stairs and perform activities of daily living without stopping and without symptoms of chest pain or shortness of breath.  Anesthesia review: Murmur, bradycardia, HTN, Parkinson's, RBBB, hx of atrial septal defect.  Hx of ASD repair 1961  Patient denies shortness of breath, fever, cough and chest pain at PAT appointment  Patient verbalized understanding of instructions that were given to them at the PAT appointment. Patient was also instructed that they will need to review over the PAT instructions again at home before surgery.

## 2021-12-23 ENCOUNTER — Encounter (HOSPITAL_COMMUNITY)
Admission: RE | Admit: 2021-12-23 | Discharge: 2021-12-23 | Disposition: A | Discharge status: Home / Self Care | Payer: Medicare Other | Source: Ambulatory Visit | Attending: Orthopedic Surgery | Admitting: Orthopedic Surgery

## 2021-12-23 ENCOUNTER — Other Ambulatory Visit: Payer: Self-pay

## 2021-12-23 ENCOUNTER — Ambulatory Visit (HOSPITAL_COMMUNITY)
Admission: RE | Admit: 2021-12-23 | Discharge: 2021-12-23 | Disposition: A | Discharge status: Home / Self Care | Payer: Medicare Other | Source: Ambulatory Visit | Attending: Orthopedic Surgery | Admitting: Orthopedic Surgery

## 2021-12-23 ENCOUNTER — Encounter (HOSPITAL_COMMUNITY): Payer: Self-pay

## 2021-12-23 VITALS — BP 141/85 | HR 85 | Temp 98.0°F | Resp 20 | Ht 72.0 in | Wt 158.8 lb

## 2021-12-23 DIAGNOSIS — Z01818 Encounter for other preprocedural examination: Secondary | ICD-10-CM | POA: Insufficient documentation

## 2021-12-23 DIAGNOSIS — I10 Essential (primary) hypertension: Secondary | ICD-10-CM | POA: Diagnosis not present

## 2021-12-23 DIAGNOSIS — Z79899 Other long term (current) drug therapy: Secondary | ICD-10-CM | POA: Insufficient documentation

## 2021-12-23 DIAGNOSIS — Z8719 Personal history of other diseases of the digestive system: Secondary | ICD-10-CM | POA: Diagnosis not present

## 2021-12-23 DIAGNOSIS — R7303 Prediabetes: Secondary | ICD-10-CM

## 2021-12-23 DIAGNOSIS — E785 Hyperlipidemia, unspecified: Secondary | ICD-10-CM | POA: Insufficient documentation

## 2021-12-23 DIAGNOSIS — Z85820 Personal history of malignant melanoma of skin: Secondary | ICD-10-CM | POA: Diagnosis not present

## 2021-12-23 DIAGNOSIS — G20A1 Parkinson's disease without dyskinesia, without mention of fluctuations: Secondary | ICD-10-CM | POA: Diagnosis not present

## 2021-12-23 DIAGNOSIS — K219 Gastro-esophageal reflux disease without esophagitis: Secondary | ICD-10-CM | POA: Insufficient documentation

## 2021-12-23 DIAGNOSIS — I2510 Atherosclerotic heart disease of native coronary artery without angina pectoris: Secondary | ICD-10-CM | POA: Insufficient documentation

## 2021-12-23 DIAGNOSIS — N400 Enlarged prostate without lower urinary tract symptoms: Secondary | ICD-10-CM | POA: Insufficient documentation

## 2021-12-23 DIAGNOSIS — M19012 Primary osteoarthritis, left shoulder: Secondary | ICD-10-CM | POA: Diagnosis not present

## 2021-12-23 HISTORY — DX: Prediabetes: R73.03

## 2021-12-23 HISTORY — DX: Other complications of anesthesia, initial encounter: T88.59XA

## 2021-12-23 HISTORY — DX: Pneumonia, unspecified organism: J18.9

## 2021-12-23 HISTORY — DX: Parkinson's disease without dyskinesia, without mention of fluctuations: G20.A1

## 2021-12-23 LAB — BASIC METABOLIC PANEL
Anion gap: 6 (ref 5–15)
BUN: 23 mg/dL (ref 8–23)
CO2: 27 mmol/L (ref 22–32)
Calcium: 9.2 mg/dL (ref 8.9–10.3)
Chloride: 104 mmol/L (ref 98–111)
Creatinine, Ser: 0.79 mg/dL (ref 0.61–1.24)
GFR, Estimated: >60 mL/min (ref >60)
Glucose, Bld: 99 mg/dL (ref 70–99)
Potassium: 4.3 mmol/L (ref 3.5–5.1)
Sodium: 137 mmol/L (ref 135–145)

## 2021-12-23 LAB — CBC
HCT: 41.5 % (ref 39.0–52.0)
Hemoglobin: 13.5 g/dL (ref 13.0–17.0)
MCH: 31.6 pg (ref 26.0–34.0)
MCHC: 32.5 g/dL (ref 30.0–36.0)
MCV: 97.2 fL (ref 80.0–100.0)
Platelets: 223 10*3/uL (ref 150–400)
RBC: 4.27 MIL/uL (ref 4.22–5.81)
RDW: 12.6 % (ref 11.5–15.5)
WBC: 7.2 10*3/uL (ref 4.0–10.5)
nRBC: 0 % (ref 0.0–0.2)

## 2021-12-23 LAB — SURGICAL PCR SCREEN
MRSA, PCR: NEGATIVE
Staphylococcus aureus: NEGATIVE

## 2021-12-23 LAB — GLUCOSE, CAPILLARY: Glucose-Capillary: 109 mg/dL — ABNORMAL HIGH (ref 70–99)

## 2021-12-24 LAB — HEMOGLOBIN A1C
Hgb A1c MFr Bld: 5.5 % (ref 4.8–5.6)
Mean Plasma Glucose: 111 mg/dL

## 2021-12-26 NOTE — Progress Notes (Signed)
Anesthesia Chart Review   Case: [id number] Date/Time: 12/29/21 0800   Procedure: REVERSE SHOULDER ARTHROPLASTY (Left: Shoulder)   Anesthesia type: Choice   Pre-op diagnosis: LEFT SHOULDER ARTHRITIS   Location: [facility]   Surgeons: Tania Ade, MD       DISCUSSION:76 y.o. never smoker with h/o HTN, Gilbert's syndrome, BPH, left shoulder OA scheduled for above procedure 12/29/2021 with Dr. Tania Ade.   Pt seen by cardiology 12/07/2021. Per OV note, "Preoperative: left shoulder. At higher but not prohibitive risk for respiratory complications and slow extubation with Parkinson's and bradycardia with Wenkebach and above his block documented if he were to have vagal episode with surgery/pain. Hold norvasc day before and morning of surgery "  Anticipate pt can proceed with planned procedure barring acute status change.   VS: BP (!) 141/85   Pulse 85   Temp 36.7 C (Oral)   Resp 20   Ht 6' (1.829 m)   Wt 72 kg   SpO2 97%   BMI 21.54 kg/m   PROVIDERS: Binnie Rail, MD is PCP   Jenkins Rouge, MD is Cardiologist  LABS: Labs reviewed: Acceptable for surgery. (all labs ordered are listed, but only abnormal results are displayed)  Labs Reviewed  GLUCOSE, CAPILLARY - Abnormal; Notable for the following components:      Result Value   Glucose-Capillary 109 (*)    All other components within normal limits  SURGICAL PCR SCREEN  HEMOGLOBIN A1C  CBC  BASIC METABOLIC PANEL     IMAGES:   EKG:   CV: Echo 07/19/2016 - Left ventricle: The cavity size was normal. There was mild    concentric hypertrophy. Systolic function was normal. The    estimated ejection fraction was in the range of 60% to 65%.    Hypokinesis of the anteroseptal and inferoseptal myocardium.    Doppler parameters are consistent with abnormal left ventricular    relaxation (grade 1 diastolic dysfunction). Doppler parameters    are consistent with indeterminate ventricular filling pressure.   - Aortic valve: Transvalvular velocity was within the normal range.    There was no stenosis. There was trivial regurgitation.  - Mitral valve: Transvalvular velocity was within the normal range.    There was no evidence for stenosis. There was trivial    regurgitation.  - Left atrium: The atrium was moderately dilated.  - Right ventricle: The cavity size was mildly dilated. Wall    thickness was normal. Systolic function was normal.  - Right atrium: The atrium was mildly dilated.  - Atrial septum: No defect or patent foramen ovale was identified    by color flow Doppler.  - Tricuspid valve: There was mild regurgitation.  - Pulmonary arteries: Systolic pressure was within the normal    range. PA peak pressure: 30 mm Hg (S).   Myocardial Perfusion 07/13/2016 The patient walked for 9:00 on a Bruce protocol GXT. Peak HR of 130 which is 87% predicted maximal HR. There were no ST or T wave changes to suggest ischemia. BP response was normal The study is normal. This is a low risk study. The left ventricular ejection fraction is mildly decreased (45-54%). Nuclear stress EF: 50%. Past Medical History:  Diagnosis Date   Arthritis    LEFT shoulder   Bowel habit changes 06/2016   on Ketogenic diet/ does not go to the BR but every 3 days   BPH (benign prostatic hyperplasia)    Cataract 2015   bilateral sx  Complication of anesthesia    slow to wake   Diverticulosis of colon 2008   External hemorrhoids    GERD (gastroesophageal reflux disease)    with certain foods   Gilbert's syndrome    Heart murmur    History of anesthesia complications    Per pt, gets very light-headed past sedation/ fainted 1 time!   Hyperlipidemia    on meds   Hypertension    on meds   Parkinson disease    Pneumonia    Pre-diabetes    Skin cancer    X2 in [place]; now seeing Dr Georgia Duff    Past Surgical History:  Procedure Laterality Date   ASD [place]- open heart surgery    CATARACT EXTRACTION Bilateral 07/2013   bilateral eyes; Dr Thyra Breed, [facility]   COLONOSCOPY  2008   [facility]   COLONOSCOPY  2017   JMP-MAC-2 day suprep(good)-polyps   INGUINAL HERNIA REPAIR Left 2006   [place]     as child   WISDOM TOOTH EXTRACTION      MEDICATIONS:  alclomethasone (ACLOVATE) 0.05 % cream   ALLEGRA ALLERGY 180 MG tablet   amLODipine (NORVASC) 5 MG tablet   bifidobacterium infantis (ALIGN) capsule   carbidopa-levodopa (SINEMET IR) 25-100 MG tablet   hydrocortisone 2.5 % cream   Multiple Vitamins-Minerals (CENTRUM SILVER 50+MEN) TABS   polyethylene glycol (MIRALAX / GLYCOLAX) 17 g packet   triamcinolone (KENALOG) 0.1 %   No current facility-administered medications for this encounter.      Konrad Felix Ward, PA-C [facility] [phone]

## 2021-12-27 ENCOUNTER — Ambulatory Visit: Payer: Medicare Other | Attending: Internal Medicine | Admitting: Internal Medicine

## 2021-12-27 VITALS — BP 114/70 | HR 71 | Ht 72.0 in | Wt 159.8 lb

## 2021-12-27 DIAGNOSIS — G20A1 Parkinson's disease without dyskinesia, without mention of fluctuations: Secondary | ICD-10-CM | POA: Diagnosis not present

## 2021-12-27 DIAGNOSIS — I493 Ventricular premature depolarization: Secondary | ICD-10-CM | POA: Diagnosis not present

## 2021-12-27 NOTE — Progress Notes (Signed)
HPI Douglas Johnston returns today for ongoing evaluation of heart block. He is a pleasant 76 yo man with a h/o HTN, remote ASD repair, and Parkinsons. He also has PVC's. He wore a cardiac monitor demonstrating AVWB and periods of 2:1 AV block, mostly at night. He also has frequent PVC's including bigeminy.  He has not had syncope. He does have mild fatigue. He has lost 30 lbs but none recently.  His bp has been a little low at times. He has had his dose of amlodipine reduced.  Allergies  Allergen Reactions   Tessalon [Benzonatate] Shortness Of Breath and Cough    Inc cough, wheeze, SOB     Current Outpatient Medications  Medication Sig Dispense Refill   alclomethasone (ACLOVATE) 0.05 % cream Apply 1 application  topically daily as needed (psoriasis).     ALLEGRA ALLERGY 180 MG tablet Take 180 mg by mouth daily as needed for allergies.     amLODipine (NORVASC) 5 MG tablet TAKE 1 & 1/2 TABLETS ONCE DAILY (Patient taking differently: 2.5 mg daily.) 135 tablet 0   bifidobacterium infantis (ALIGN) capsule Take 1 capsule by mouth daily.     carbidopa-levodopa (SINEMET IR) 25-100 MG tablet TAKE ONE TABLET BY MOUTH THREE TIMES DAILY 270 tablet 0   hydrocortisone 2.5 % cream Apply 1 Application topically 2 (two) times daily as needed (psoriasis).     Multiple Vitamins-Minerals (CENTRUM SILVER 50+MEN) TABS Take 1 tablet by mouth daily.     polyethylene glycol (MIRALAX / GLYCOLAX) 17 g packet Take 17 g by mouth daily.     triamcinolone (KENALOG) 0.1 % Apply 1 Application topically daily as needed (psoriasis).     No current facility-administered medications for this visit.     Past Medical History:  Diagnosis Date   Arthritis    LEFT shoulder   Bowel habit changes 06/2016   on Ketogenic diet/ does not go to the BR but every 3 days   BPH (benign prostatic hyperplasia)    Cataract 2015   bilateral sx   Complication of anesthesia    slow to wake   Diverticulosis of colon 2008    External hemorrhoids    GERD (gastroesophageal reflux disease)    with certain foods   Gilbert's syndrome    Heart murmur    History of anesthesia complications    Per pt, gets very light-headed past sedation/ fainted 1 time!   Hyperlipidemia    on meds   Hypertension    on meds   Parkinson disease    Pneumonia    Pre-diabetes    Skin cancer    X2 in [place]; now seeing Douglas Johnston    ROS:   All systems reviewed and negative except as noted in the HPI.   Past Surgical History:  Procedure Laterality Date   ASD [place]- open heart surgery   CATARACT EXTRACTION Bilateral 07/2013   bilateral eyes; [facility] Thyra Breed, [facility]   COLONOSCOPY  2008   [facility]   COLONOSCOPY  2017   JMP-MAC-2 day suprep(good)-polyps   INGUINAL HERNIA REPAIR Left 2006   [place]     as child   WISDOM TOOTH EXTRACTION       Family History  Problem Relation Age of Onset   Hypertension Mother    Other Mother        intestine blockage-burst   Emphysema  Father        smoker   Cancer Neg Hx    Diabetes Neg Hx    Heart disease Neg Hx    Stroke Neg Hx    Colon cancer Neg Hx    Colon polyps Neg Hx    Stomach cancer Neg Hx    Rectal cancer Neg Hx      Social History   Socioeconomic History   Marital status: Married    Spouse name: Douglas Johnston   Number of children: 0   Years of education: BA   Highest education level: Not on file  Occupational History   Occupation: Retired  Tobacco Use   Smoking status: Never   Smokeless tobacco: Never  Vaping Use   Vaping Use: Never used  Substance and Sexual Activity   Alcohol use: Yes    Comment: Rare   Drug use: No   Sexual activity: Yes  Other Topics Concern   Not on file  Social History Narrative      Caffeine use: coffee daily   Right handed    Social Determinants of Health   Financial Resource Strain: Low Risk  (11/01/2021)   Overall Financial  Resource Strain (CARDIA)    Difficulty of Paying Living Expenses: Not hard at all  Food Insecurity: No Food Insecurity (11/01/2021)   Hunger Vital Sign    Worried About Running Out of Food in the Last Year: Never true    Ran Out of Food in the Last Year: Never true  Transportation Needs: No Transportation Needs (11/01/2021)   PRAPARE - Hydrologist (Medical): No    Lack of Transportation (Non-Medical): No  Physical Activity: Sufficiently Active (11/01/2021)   Exercise Vital Sign    Days of Exercise per Week: 5 days    Minutes of Exercise per Session: 120 min  Stress: No Stress Concern Present (11/01/2021)   [place]    Feeling of Stress : Not at all  Social Connections: [place] (11/01/2021)   Social Connection and Isolation Panel [NHANES]    Frequency of Communication with Friends and Family: More than three times a week    Frequency of Social Gatherings with Friends and Family: More than three times a week    Attends Religious Services: More than 4 times per year    Active Member of [employer] or Organizations: Yes    Attends Music therapist: More than 4 times per year    Marital Status: Married  Human resources officer Violence: Not At Risk (11/01/2021)   Humiliation, Afraid, Rape, and Kick questionnaire    Fear of Current or Ex-Partner: No    Emotionally Abused: No    Physically Abused: No    Sexually Abused: No     BP 114/70   Pulse 71   Ht 6' (1.829 m)   Wt 159 lb 12.8 oz (72.5 kg)   SpO2 93%   BMI 21.67 kg/m   Physical Exam:  Well appearing NAD HEENT: Unremarkable Neck:  No JVD, no thyromegally Lymphatics:  No adenopathy Back:  No CVA tenderness Lungs:  Clear HEART:  Regular rate rhythm, no murmurs, no rubs, no clicks Abd:  soft, positive bowel sounds, no organomegally, no rebound, no guarding Ext:  2 plus pulses, no edema, no cyanosis, no  clubbing Skin:  No rashes no nodules Neuro:  CN II through XII intact, motor grossly intact  DEVICE  Normal device function.  See  PaceArt for details.   Assess/Plan: 1. PVC's - he is not symptomatic in that he does not have palpitations. We discussed the treatment. He is minimally if at all symptomatic. He will undergo watchful waiting 2. 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 Again he is minimally if at all symptomatic. I gave him an idea of the symptoms he might experience and recommended watchful waiting and avoidance of AV nodal blocking drugs.  3. Parkinson's - this complicates things as we know that he is at risk for passing out from vasodepressor problems. He has not had syncope. I encouraged him to restart his exercise program.  4. Hypertension - he has a predilection for low bp now. I asked him to stop the amlodipine and only take for SBP over 150.      Douglas Overlie Marya Lowden,MD

## 2021-12-27 NOTE — Patient Instructions (Signed)

## 2021-12-29 ENCOUNTER — Ambulatory Visit (HOSPITAL_COMMUNITY): Payer: Medicare Other | Admitting: Physician Assistant

## 2021-12-29 ENCOUNTER — Encounter (HOSPITAL_COMMUNITY)
Admission: RE | Disposition: A | Discharge status: Home / Self Care | Payer: Self-pay | Source: Ambulatory Visit | Attending: Orthopedic Surgery

## 2021-12-29 ENCOUNTER — Other Ambulatory Visit: Payer: Self-pay

## 2021-12-29 ENCOUNTER — Ambulatory Visit (HOSPITAL_BASED_OUTPATIENT_CLINIC_OR_DEPARTMENT_OTHER): Payer: Medicare Other | Admitting: Certified Registered Nurse Anesthetist

## 2021-12-29 ENCOUNTER — Ambulatory Visit (HOSPITAL_COMMUNITY)
Admission: RE | Admit: 2021-12-29 | Discharge: 2021-12-29 | Disposition: A | Discharge status: Home / Self Care | Payer: Medicare Other | Source: Ambulatory Visit | Attending: Orthopedic Surgery | Admitting: Orthopedic Surgery

## 2021-12-29 ENCOUNTER — Encounter (HOSPITAL_COMMUNITY): Payer: Self-pay | Admitting: Orthopedic Surgery

## 2021-12-29 DIAGNOSIS — M19012 Primary osteoarthritis, left shoulder: Secondary | ICD-10-CM | POA: Diagnosis not present

## 2021-12-29 DIAGNOSIS — G8918 Other acute postprocedural pain: Secondary | ICD-10-CM | POA: Diagnosis not present

## 2021-12-29 DIAGNOSIS — K219 Gastro-esophageal reflux disease without esophagitis: Secondary | ICD-10-CM | POA: Diagnosis not present

## 2021-12-29 DIAGNOSIS — I10 Essential (primary) hypertension: Secondary | ICD-10-CM | POA: Diagnosis not present

## 2021-12-29 DIAGNOSIS — G20A1 Parkinson's disease without dyskinesia, without mention of fluctuations: Secondary | ICD-10-CM | POA: Diagnosis not present

## 2021-12-29 HISTORY — PX: REVERSE SHOULDER ARTHROPLASTY: SHX5054

## 2021-12-29 SURGERY — ARTHROPLASTY, SHOULDER, TOTAL, REVERSE
Anesthesia: Regional | Site: Shoulder | Laterality: Left

## 2021-12-29 MED ORDER — TIZANIDINE HCL 2 MG PO TABS: 2.0000 mg | ORAL_TABLET | Freq: Three times a day (TID) | ORAL | 0 refills | Status: DC | PRN

## 2021-12-29 MED ORDER — DEXAMETHASONE SODIUM PHOSPHATE 10 MG/ML IJ SOLN
INTRAMUSCULAR | Status: DC | PRN
  Administered 2021-12-29: 6 mg via INTRAVENOUS

## 2021-12-29 MED ORDER — LIDOCAINE 2% (20 MG/ML) 5 ML SYRINGE
INTRAMUSCULAR | Status: DC | PRN
  Administered 2021-12-29: 60 mg via INTRAVENOUS

## 2021-12-29 MED ORDER — DEXAMETHASONE SODIUM PHOSPHATE 10 MG/ML IJ SOLN
INTRAMUSCULAR | Status: AC
  Filled 2021-12-29: qty 2

## 2021-12-29 MED ORDER — ONDANSETRON HCL 4 MG/2ML IJ SOLN
INTRAMUSCULAR | Status: DC | PRN
  Administered 2021-12-29: 4 mg via INTRAVENOUS

## 2021-12-29 MED ORDER — BUPIVACAINE-EPINEPHRINE (PF) 0.5% -1:200000 IJ SOLN
INTRAMUSCULAR | Status: DC | PRN
  Administered 2021-12-29: 20 mL via PERINEURAL

## 2021-12-29 MED ORDER — ORAL CARE MOUTH RINSE: 15.0000 mL | Freq: Once | OROMUCOSAL | Status: AC

## 2021-12-29 MED ORDER — PROPOFOL 10 MG/ML IV BOLUS
INTRAVENOUS | Status: AC
  Filled 2021-12-29: qty 20

## 2021-12-29 MED ORDER — ONDANSETRON HCL 4 MG/2ML IJ SOLN
INTRAMUSCULAR | Status: AC
  Filled 2021-12-29: qty 4

## 2021-12-29 MED ORDER — LIDOCAINE HCL (PF) 2 % IJ SOLN
INTRAMUSCULAR | Status: AC
  Filled 2021-12-29: qty 10

## 2021-12-29 MED ORDER — CHLORHEXIDINE GLUCONATE 0.12 % MT SOLN
15.0000 mL | Freq: Once | OROMUCOSAL | Status: AC
  Administered 2021-12-29: 15 mL via OROMUCOSAL

## 2021-12-29 MED ORDER — SUGAMMADEX SODIUM 200 MG/2ML IV SOLN
INTRAVENOUS | Status: DC | PRN
  Administered 2021-12-29: 200 mg via INTRAVENOUS

## 2021-12-29 MED ORDER — TRANEXAMIC ACID-NACL 1000-0.7 MG/100ML-% IV SOLN
1000.0000 mg | INTRAVENOUS | Status: AC
  Administered 2021-12-29: 1000 mg via INTRAVENOUS
  Filled 2021-12-29: qty 100

## 2021-12-29 MED ORDER — PHENYLEPHRINE 80 MCG/ML (10ML) SYRINGE FOR IV PUSH (FOR BLOOD PRESSURE SUPPORT)
PREFILLED_SYRINGE | INTRAVENOUS | Status: AC
  Filled 2021-12-29: qty 10

## 2021-12-29 MED ORDER — SODIUM CHLORIDE 0.9 % IR SOLN
Status: DC | PRN
  Administered 2021-12-29: 3000 mL

## 2021-12-29 MED ORDER — PROPOFOL 1000 MG/100ML IV EMUL
INTRAVENOUS | Status: AC
  Filled 2021-12-29: qty 100

## 2021-12-29 MED ORDER — LACTATED RINGERS IV SOLN: INTRAVENOUS | Status: DC

## 2021-12-29 MED ORDER — FENTANYL CITRATE (PF) 100 MCG/2ML IJ SOLN
INTRAMUSCULAR | Status: DC | PRN
  Administered 2021-12-29: 50 ug via INTRAVENOUS

## 2021-12-29 MED ORDER — MIDAZOLAM HCL 5 MG/5ML IJ SOLN
INTRAMUSCULAR | Status: DC | PRN
  Administered 2021-12-29: 1 mg via INTRAVENOUS

## 2021-12-29 MED ORDER — 0.9 % SODIUM CHLORIDE (POUR BTL) OPTIME
TOPICAL | Status: DC | PRN
  Administered 2021-12-29: 1000 mL

## 2021-12-29 MED ORDER — WATER FOR IRRIGATION, STERILE IR SOLN
Status: DC | PRN
  Administered 2021-12-29: 500 mL
  Administered 2021-12-29: 1000 mL

## 2021-12-29 MED ORDER — ROCURONIUM BROMIDE 10 MG/ML (PF) SYRINGE
PREFILLED_SYRINGE | INTRAVENOUS | Status: DC | PRN
  Administered 2021-12-29: 90 mg via INTRAVENOUS

## 2021-12-29 MED ORDER — MIDAZOLAM HCL 2 MG/2ML IJ SOLN
INTRAMUSCULAR | Status: AC
  Filled 2021-12-29: qty 2

## 2021-12-29 MED ORDER — FENTANYL CITRATE (PF) 100 MCG/2ML IJ SOLN
INTRAMUSCULAR | Status: AC
  Filled 2021-12-29: qty 2

## 2021-12-29 MED ORDER — CEFAZOLIN SODIUM-DEXTROSE 2-4 GM/100ML-% IV SOLN
2.0000 g | INTRAVENOUS | Status: AC
  Administered 2021-12-29: 2 g via INTRAVENOUS
  Filled 2021-12-29: qty 100

## 2021-12-29 MED ORDER — PHENYLEPHRINE HCL-NACL 20-0.9 MG/250ML-% IV SOLN
INTRAVENOUS | Status: DC | PRN
  Administered 2021-12-29: 45 ug/min via INTRAVENOUS

## 2021-12-29 MED ORDER — PROPOFOL 10 MG/ML IV BOLUS
INTRAVENOUS | Status: DC | PRN
  Administered 2021-12-29: 100 mg via INTRAVENOUS
  Administered 2021-12-29: 30 mg via INTRAVENOUS

## 2021-12-29 MED ORDER — BUPIVACAINE LIPOSOME 1.3 % IJ SUSP
INTRAMUSCULAR | Status: DC | PRN
  Administered 2021-12-29: 133 mg via PERINEURAL

## 2021-12-29 MED ORDER — OXYCODONE HCL 5 MG PO TABS: 5.0000 mg | ORAL_TABLET | ORAL | 0 refills | Status: DC | PRN

## 2021-12-29 MED ORDER — PHENYLEPHRINE 80 MCG/ML (10ML) SYRINGE FOR IV PUSH (FOR BLOOD PRESSURE SUPPORT)
PREFILLED_SYRINGE | INTRAVENOUS | Status: DC | PRN
  Administered 2021-12-29: 80 ug via INTRAVENOUS

## 2021-12-29 MED ORDER — ROCURONIUM BROMIDE 10 MG/ML (PF) SYRINGE
PREFILLED_SYRINGE | INTRAVENOUS | Status: AC
  Filled 2021-12-29: qty 10

## 2021-12-29 MED ORDER — PROPOFOL 500 MG/50ML IV EMUL
INTRAVENOUS | Status: DC | PRN
  Administered 2021-12-29: 100 ug/kg/min via INTRAVENOUS

## 2021-12-29 SURGICAL SUPPLY — 80 items
AID PSTN UNV HD RSTRNT DISP (MISCELLANEOUS) ×1
BAG COUNTER SPONGE SURGICOUNT (BAG) IMPLANT
BAG SPEC THK2 15X12 ZIP CLS (MISCELLANEOUS) ×1
BAG SPNG CNTER NS LX DISP (BAG)
BAG ZIPLOCK 12X15 (MISCELLANEOUS) ×2 IMPLANT
BASEPLATE GLENOID STD REV 42 (Joint) IMPLANT
BASEPLATE SHOULDER FW 15D 29 (Joint) IMPLANT
BIT DRILL 1.6MX128 (BIT) IMPLANT
BIT DRILL 3.2 PERIPHERAL SCREW (BIT) IMPLANT
BLADE SAW SGTL 73X25 THK (BLADE) ×2 IMPLANT
BOOTIES KNEE HIGH SLOAN (MISCELLANEOUS) ×4 IMPLANT
BSPLAT GLND 15D 29 FULL WDG (Joint) ×1 IMPLANT
CLSR STERI-STRIP ANTIMIC 1/2X4 (GAUZE/BANDAGES/DRESSINGS) IMPLANT
COOLER ICEMAN CLASSIC (MISCELLANEOUS) IMPLANT
COVER BACK TABLE 60X90IN (DRAPES) ×2 IMPLANT
COVER SURGICAL LIGHT HANDLE (MISCELLANEOUS) ×2 IMPLANT
CUP HUM REV SHLD 3/4 42 +0 (Cup) IMPLANT
DRAPE INCISE IOBAN 66X45 STRL (DRAPES) ×2 IMPLANT
DRAPE ORTHO SPLIT 77X108 STRL (DRAPES) ×2
DRAPE POUCH INSTRU U-SHP 10X18 (DRAPES) ×2 IMPLANT
DRAPE SHEET LG 3/4 BI-LAMINATE (DRAPES) ×2 IMPLANT
DRAPE SURG 17X11 SM STRL (DRAPES) ×2 IMPLANT
DRAPE SURG ORHT 6 SPLT 77X108 (DRAPES) ×4 IMPLANT
DRAPE TOP 10253 STERILE (DRAPES) ×2 IMPLANT
DRAPE U-SHAPE 47X51 STRL (DRAPES) ×2 IMPLANT
DRSG AQUACEL AG ADV 3.5X 6 (GAUZE/BANDAGES/DRESSINGS) ×2 IMPLANT
DURAPREP 26ML APPLICATOR (WOUND CARE) ×4 IMPLANT
ELECT BLADE TIP CTD 4 INCH (ELECTRODE) ×2 IMPLANT
ELECT REM PT RETURN 15FT ADLT (MISCELLANEOUS) ×2 IMPLANT
FACESHIELD WRAPAROUND (MASK) ×1 IMPLANT
FACESHIELD WRAPAROUND OR TEAM (MASK) ×2 IMPLANT
GLOVE BIO SURGEON STRL SZ7.5 (GLOVE) ×2 IMPLANT
GLOVE BIOGEL PI IND STRL 6.5 (GLOVE) ×2 IMPLANT
GLOVE BIOGEL PI IND STRL 8 (GLOVE) ×2 IMPLANT
GLOVE SURG SS PI 6.5 STRL IVOR (GLOVE) ×2 IMPLANT
GOWN STRL REUS W/ TWL LRG LVL3 (GOWN DISPOSABLE) ×2 IMPLANT
GOWN STRL REUS W/ TWL XL LVL3 (GOWN DISPOSABLE) ×2 IMPLANT
GOWN STRL REUS W/TWL LRG LVL3 (GOWN DISPOSABLE) ×1
GOWN STRL REUS W/TWL XL LVL3 (GOWN DISPOSABLE) ×1
GUIDE PIN 3X75 SHOULDER (PIN) ×1
GUIDEWIRE GLENOID 2.5X220 (WIRE) IMPLANT
HANDPIECE INTERPULSE COAX TIP (DISPOSABLE) ×1
HOOD PEEL AWAY T7 (MISCELLANEOUS) ×6 IMPLANT
KIT BASIN OR (CUSTOM PROCEDURE TRAY) ×2 IMPLANT
KIT TURNOVER KIT A (KITS) IMPLANT
MANIFOLD NEPTUNE II (INSTRUMENTS) ×2 IMPLANT
NDL TROCAR POINT SZ 2 1/2 (NEEDLE) IMPLANT
NEEDLE TROCAR POINT SZ 2 1/2 (NEEDLE) IMPLANT
NS IRRIG 1000ML POUR BTL (IV SOLUTION) ×2 IMPLANT
PACK SHOULDER (CUSTOM PROCEDURE TRAY) ×2 IMPLANT
PAD COLD SHLDR WRAP-ON (PAD) IMPLANT
PIN GUIDE 3X75 SHOULDER (PIN) IMPLANT
PROTECTOR NERVE ULNAR (MISCELLANEOUS) IMPLANT
RESTRAINT HEAD UNIVERSAL NS (MISCELLANEOUS) ×2 IMPLANT
RETRIEVER SUT HEWSON (MISCELLANEOUS) IMPLANT
SCREW 5.0X18 (Screw) IMPLANT
SCREW 5.5X22 (Screw) IMPLANT
SCREW BONE INTRNL SM 7 (Screw) IMPLANT
SCREW PERIPHERAL 30 (Screw) IMPLANT
SCREW PERIPHERAL 5.0X34 (Screw) IMPLANT
SET HNDPC FAN SPRY TIP SCT (DISPOSABLE) ×2 IMPLANT
SLING ARM FOAM STRAP MED (SOFTGOODS) IMPLANT
SLING ARM IMMOBILIZER LRG (SOFTGOODS) IMPLANT
SLING ARM IMMOBILIZER MED (SOFTGOODS) IMPLANT
STEM HUM PERFORM 3+ (Stem) IMPLANT
STRIP CLOSURE SKIN 1/2X4 (GAUZE/BANDAGES/DRESSINGS) ×4 IMPLANT
SUCTION FRAZIER HANDLE 10FR (MISCELLANEOUS)
SUCTION TUBE FRAZIER 10FR DISP (MISCELLANEOUS) IMPLANT
SUPPORT WRAP ARM LG (MISCELLANEOUS) IMPLANT
SUT ETHIBOND 2 V 37 (SUTURE) IMPLANT
SUT FIBERWIRE #2 38 REV NDL BL (SUTURE)
SUT MNCRL AB 4-0 PS2 18 (SUTURE) ×2 IMPLANT
SUT VIC AB 2-0 CT1 27 (SUTURE) ×2
SUT VIC AB 2-0 CT1 TAPERPNT 27 (SUTURE) ×4 IMPLANT
SUTURE FIBERWR#2 38 REV NDL BL (SUTURE) IMPLANT
TAPE LABRALWHITE 1.5X36 (TAPE) IMPLANT
TAPE SUT LABRALTAP WHT/BLK (SUTURE) IMPLANT
TOWEL OR 17X26 10 PK STRL BLUE (TOWEL DISPOSABLE) ×2 IMPLANT
TOWEL OR NON WOVEN STRL DISP B (DISPOSABLE) ×2 IMPLANT
WATER STERILE IRR 1000ML POUR (IV SOLUTION) ×2 IMPLANT

## 2021-12-29 NOTE — Anesthesia Preprocedure Evaluation (Addendum)
Anesthesia Evaluation  Patient identified by MRN, date of birth, ID band Patient awake    Reviewed: Allergy & Precautions, NPO status , Patient's Chart, lab work & pertinent test results  History of Anesthesia Complications Negative for: history of anesthetic complications  Airway Mallampati: I  TM Distance: >3 FB Neck ROM: Full    Dental  (+) Teeth Intact, Dental Advisory Given   Pulmonary neg shortness of breath, neg sleep apnea, neg COPD, neg recent URI   breath sounds clear to auscultation       Cardiovascular hypertension, Pt. on medications (-) angina (-) Past MI and (-) CHF  Rhythm:Regular  S/p ASD repair   Neuro/Psych Parkinson's  negative psych ROS   GI/Hepatic Neg liver ROS,GERD  ,,  Endo/Other  negative endocrine ROS    Renal/GU negative Renal ROS     Musculoskeletal  (+) Arthritis ,    Abdominal   Peds  Hematology Lab Results      Component                Value               Date                      WBC                      7.2                 12/23/2021                HGB                      13.5                12/23/2021                HCT                      41.5                12/23/2021                MCV                      97.2                12/23/2021                PLT                      223                 12/23/2021              Anesthesia Other Findings   Reproductive/Obstetrics                             Anesthesia Physical Anesthesia Plan  ASA: 2  Anesthesia Plan: General and Regional   Post-op Pain Management: Regional block*   Induction: Intravenous  PONV Risk Score and Plan: Ondansetron, Dexamethasone, Propofol infusion and TIVA  Airway Management Planned: Oral ETT  Additional Equipment: None  Intra-op Plan:   Post-operative Plan: Extubation in OR  Informed Consent: I have reviewed the patients History and Physical, chart, labs and  discussed the procedure including the  risks, benefits and alternatives for the proposed anesthesia with the patient or authorized representative who has indicated his/her understanding and acceptance.     Dental advisory given  Plan Discussed with: CRNA, Anesthesiologist and Surgeon  Anesthesia Plan Comments:        Anesthesia Quick Evaluation

## 2021-12-29 NOTE — Evaluation (Signed)
Occupational Therapy Evaluation Patient Details Name: Douglas Johnston. MRN: [id number] DOB: 29-Sep-1945 Today's Date: 12/29/2021   History of Present Illness Patient presents s/p Left shoulder arthroplasty. PMH includes Parkinsons.   Clinical Impression   Douglas Johnston is a 76 year old man who presents s/p shoulder replacement without functional use of left dominant upper extremity secondary to effects of surgery and interscalene block and shoulder precautions. Therapist provided education and instruction to patient and spouse in regards to exercises, precautions, positioning, donning upper extremity clothing and bathing while maintaining shoulder precautions, ice and edema management and donning/doffing sling. Patient and spouse verbalized understanding and demonstrated as needed. Patient needed assistance to donn shirt, underwear, pants, socks and shoes and provided with instruction on compensatory strategies to perform ADLs. Patient provided with handouts to maximize retention of education. Patient's wife instructed on use of icecooler and cuff and provided with hands. Patient to follow up with MD for further therapy needs.        Recommendations for follow up therapy are one component of a multi-disciplinary discharge planning process, led by the attending physician.  Recommendations may be updated based on patient status, additional functional criteria and insurance authorization.   Follow Up Recommendations  Follow physician's recommendations for discharge plan and follow up therapies     Assistance Recommended at Discharge Frequent or constant Supervision/Assistance  Patient can return home with the following A little help with walking and/or transfers;A lot of help with bathing/dressing/bathroom;Assistance with cooking/housework    Functional Status Assessment  Patient has had a recent decline in their functional status and demonstrates the ability to make significant  improvements in function in a reasonable and predictable amount of time.  Equipment Recommendations  None recommended by OT    Recommendations for Other Services       Precautions / Restrictions Precautions Precautions: Shoulder Type of Shoulder Precautions: No AROM, No PROM Shoulder Interventions: Shoulder sling/immobilizer;Off for dressing/bathing/exercises Precaution Booklet Issued: Yes (comment) Required Braces or Orthoses: Sling Restrictions Weight Bearing Restrictions: Yes LUE Weight Bearing: Non weight bearing      Mobility Bed Mobility                    Transfers                          Balance Overall balance assessment: Mild deficits observed, not formally tested                                         ADL either performed or assessed with clinical judgement   ADL Overall ADL's : Needs assistance/impaired Eating/Feeding: Set up   Grooming: Set up   Upper Body Bathing: Moderate assistance   Lower Body Bathing: Moderate assistance   Upper Body Dressing : Maximal assistance   Lower Body Dressing: Maximal assistance   Toilet Transfer: Minimal assistance   Toileting- Clothing Manipulation and Hygiene: Minimal assistance               Vision Patient Visual Report: No change from baseline       Perception     Praxis      Pertinent Vitals/Pain Pain Assessment Pain Assessment: No/denies pain     Hand Dominance Left   Extremity/Trunk Assessment Upper Extremity Assessment Upper Extremity Assessment: LUE deficits/detail LUE Deficits / Details: impaired sensation and motor control  secondary to effects of medication   Lower Extremity Assessment Lower Extremity Assessment: Overall WFL for tasks assessed   Cervical / Trunk Assessment Cervical / Trunk Assessment: Normal   Communication Communication Communication: No difficulties   Cognition Arousal/Alertness: Suspect due to medications Behavior During  Therapy: WFL for tasks assessed/performed Overall Cognitive Status: Within Functional Limits for tasks assessed                                       General Comments       Exercises     Shoulder Instructions Shoulder Instructions Donning/doffing shirt without moving shoulder: Caregiver independent with task Method for sponge bathing under operated UE: Caregiver independent with task Donning/doffing sling/immobilizer: Caregiver independent with task Correct positioning of sling/immobilizer: Caregiver independent with task Pendulum exercises (written home exercise program): Caregiver independent with task ROM for elbow, wrist and digits of operated UE: Caregiver independent with task Sling wearing schedule (on at all times/off for ADL's): Caregiver independent with task Proper positioning of operated UE when showering: Caregiver independent with task Dressing change: Caregiver independent with task Positioning of UE while sleeping: Caregiver independent with task    Home Living Family/patient expects to be discharged to:: Private residence Living Arrangements: Spouse/significant other Available Help at Discharge: Family;Available 24 hours/day                   Bathroom Toilet: Handicapped height                Prior Functioning/Environment Prior Level of Function : Needs assist             Mobility Comments: independent ADLs Comments: needed assistance for shirts        OT Problem List: Decreased strength;Decreased range of motion;Impaired UE functional use;Pain      OT Treatment/Interventions:      OT Goals(Current goals can be found in the care plan section) Acute Rehab OT Goals OT Goal Formulation: All assessment and education complete, DC therapy  OT Frequency:      Co-evaluation              AM-PAC OT "6 Clicks" Daily Activity     Outcome Measure Help from another person eating meals?: A Little Help from another person  taking care of personal grooming?: A Little Help from another person toileting, which includes using toliet, bedpan, or urinal?: A Little Help from another person bathing (including washing, rinsing, drying)?: A Lot Help from another person to put on and taking off regular upper body clothing?: A Lot Help from another person to put on and taking off regular lower body clothing?: A Lot 6 Click Score: 15   End of Session Nurse Communication:  (OT education complete)  Activity Tolerance: Patient tolerated treatment well Patient left: in chair;with call bell/phone within reach;with family/visitor present  OT Visit Diagnosis: Pain;Muscle weakness (generalized) (M62.81)                Time: 1829-9371 OT Time Calculation (min): 30 min Charges:  OT General Charges $OT Visit: 1 Visit OT Evaluation $OT Eval Low Complexity: 1 Low OT Treatments $Self Care/Home Management : 8-22 mins  Gustavo Lah, OTR/L Acute Care Rehab Services  Office [phone]   Lenward Chancellor 12/29/2021, 1:24 PM

## 2021-12-29 NOTE — H&P (Signed)
Douglas Johnston. is an 76 y.o. male.   Chief Complaint: L shoulder pain and dysfunction  HPI: Endstage L shoulder arthritis with significant pain and dysfunction, failed conservative measures.  Pain interferes with sleep and quality of life.   Past Medical History:  Diagnosis Date   Arthritis    LEFT shoulder   Bowel habit changes 06/2016   on Ketogenic diet/ does not go to the BR but every 3 days   BPH (benign prostatic hyperplasia)    Cataract 2015   bilateral sx   Complication of anesthesia    slow to wake   Diverticulosis of colon 2008   External hemorrhoids    GERD (gastroesophageal reflux disease)    with certain foods   Gilbert's syndrome    Heart murmur    History of anesthesia complications    Per pt, gets very light-headed past sedation/ fainted 1 time!   Hyperlipidemia    on meds   Hypertension    on meds   Parkinson disease    Pneumonia    Pre-diabetes    Skin cancer    X2 in [place]; now seeing Dr Georgia Duff    Past Surgical History:  Procedure Laterality Date   ASD [place]- open heart surgery   CATARACT EXTRACTION Bilateral 07/2013   bilateral eyes; Dr Thyra Breed, [facility]   COLONOSCOPY  2008   [facility]   COLONOSCOPY  2017   JMP-MAC-2 day suprep(good)-polyps   INGUINAL HERNIA REPAIR Left 2006   [place]     as child   WISDOM TOOTH EXTRACTION      Family History  Problem Relation Age of Onset   Hypertension Mother    Other Mother        intestine blockage-burst   Emphysema Father        smoker   Cancer Neg Hx    Diabetes Neg Hx    Heart disease Neg Hx    Stroke Neg Hx    Colon cancer Neg Hx    Colon polyps Neg Hx    Stomach cancer Neg Hx    Rectal cancer Neg Hx    Social History:  reports that he has never smoked. He has never used smokeless tobacco. He reports current alcohol use. He reports that he does not use drugs.  Allergies:  Allergies  Allergen  Reactions   Tessalon [Benzonatate] Shortness Of Breath and Cough    Inc cough, wheeze, SOB    Medications Prior to Admission  Medication Sig Dispense Refill   alclomethasone (ACLOVATE) 0.05 % cream Apply 1 application  topically daily as needed (psoriasis).     ALLEGRA ALLERGY 180 MG tablet Take 180 mg by mouth daily as needed for allergies.     bifidobacterium infantis (ALIGN) capsule Take 1 capsule by mouth daily.     carbidopa-levodopa (SINEMET IR) 25-100 MG tablet TAKE ONE TABLET BY MOUTH THREE TIMES DAILY 270 tablet 0   hydrocortisone 2.5 % cream Apply 1 Application topically 2 (two) times daily as needed (psoriasis).     Multiple Vitamins-Minerals (CENTRUM SILVER 50+MEN) TABS Take 1 tablet by mouth daily.     polyethylene glycol (MIRALAX / GLYCOLAX) 17 g packet Take 17 g by mouth daily.     triamcinolone (KENALOG) 0.1 % Apply 1 Application topically daily as needed (psoriasis).     amLODipine (NORVASC) 5 MG tablet TAKE 1 & 1/2  TABLETS ONCE DAILY (Patient taking differently: 2.5 mg daily.) 135 tablet 0    No results found for this or any previous visit (from the past 48 hour(s)). No results found.  Review of Systems  All other systems reviewed and are negative.   Blood pressure (!) 165/96, pulse (!) 55, temperature 97.7 F (36.5 C), temperature source Oral, resp. rate 16, height 6' (1.829 m), weight 72.5 kg, SpO2 98 %. Physical Exam Constitutional:      Appearance: He is well-developed.  HENT:     Head: Atraumatic.  Eyes:     Extraocular Movements: Extraocular movements intact.  Cardiovascular:     Pulses: Normal pulses.  Pulmonary:     Effort: Pulmonary effort is normal.  Musculoskeletal:     Comments: L shoulder pain with limited ROM. NVID.  Skin:    General: Skin is warm and dry.  Neurological:     Mental Status: He is alert and oriented to person, place, and time.  Psychiatric:        Mood and Affect: Mood normal.      Assessment/Plan L shoulder endstage  arthritis with bone loss Plan L shoulder reverse TSA with augmented glenoid component Risks / benefits of surgery discussed Consent on chart  NPO for OR Preop antibiotics   Rhae Hammock, MD 12/29/2021, 7:11 AM

## 2021-12-29 NOTE — Anesthesia Procedure Notes (Signed)
Procedure Name: Intubation Date/Time: 12/29/2021 7:54 AM  Performed by: Lavina Hamman, CRNAPre-anesthesia Checklist: Patient identified, Emergency Drugs available, Suction available, Patient being monitored and Timeout performed Patient Re-evaluated:Patient Re-evaluated prior to induction Oxygen Delivery Method: Circle system utilized Preoxygenation: Pre-oxygenation with 100% oxygen Induction Type: IV induction Ventilation: Mask ventilation without difficulty Laryngoscope Size: Mac and 4 Grade View: Grade II Tube type: Oral Tube size: 7.5 mm Number of attempts: 1 Airway Equipment and Method: Stylet Placement Confirmation: ETT inserted through vocal cords under direct vision, positive ETCO2, CO2 detector and breath sounds checked- equal and bilateral Secured at: 23 cm Tube secured with: Tape Dental Injury: Teeth and Oropharynx as per pre-operative assessment  Comments: ATOI

## 2021-12-29 NOTE — Anesthesia Procedure Notes (Signed)
Anesthesia Regional Block: Interscalene brachial plexus block   Pre-Anesthetic Checklist: , timeout performed,  Correct Patient, Correct Site, Correct Laterality,  Correct Procedure, Correct Position, site marked,  Risks and benefits discussed,  Surgical consent,  Pre-op evaluation,  At surgeon's request and post-op pain management  Laterality: Upper and Left  Prep: chloraprep       Needles:  Injection technique: Single-shot      Needle Length: 5cm  Needle Gauge: 22     Additional Needles: Arrow StimuQuik ECHO Echogenic Stimulating PNB Needle  Procedures:,,,, ultrasound used (permanent image in chart),,    Narrative:  Start time: 12/29/2021 7:14 AM End time: 12/29/2021 7:20 AM Injection made incrementally with aspirations every 5 mL.  Performed by: Personally  Anesthesiologist: Oleta Mouse, MD

## 2021-12-29 NOTE — Anesthesia Postprocedure Evaluation (Signed)
Anesthesia Post Note  Patient: Douglas Johnston.  Procedure(s) Performed: REVERSE SHOULDER ARTHROPLASTY (Left: Shoulder)     Patient location during evaluation: PACU Anesthesia Type: Regional and General Level of consciousness: awake and alert Pain management: pain level controlled Vital Signs Assessment: post-procedure vital signs reviewed and stable Respiratory status: spontaneous breathing, nonlabored ventilation and respiratory function stable Cardiovascular status: blood pressure returned to baseline and stable Postop Assessment: no apparent nausea or vomiting Anesthetic complications: no  No notable events documented.  Last Vitals:  Vitals:   12/29/21 1015 12/29/21 1110  BP: (!) 152/96 (!) 148/74  Pulse: 63 64  Resp:    Temp:    SpO2: 99% 99%    Last Pain:  Vitals:   12/29/21 1015  TempSrc:   PainSc: 0-No pain                 Kari Montero

## 2021-12-29 NOTE — Discharge Instructions (Signed)

## 2021-12-29 NOTE — Transfer of Care (Signed)
Immediate Anesthesia Transfer of Care Note  Patient: Douglas Johnston.  Procedure(s) Performed: Procedure(s): REVERSE SHOULDER ARTHROPLASTY (Left)  Patient Location: PACU  Anesthesia Type:GA combined with regional for post-op pain  Level of Consciousness:  sedated, patient cooperative and responds to stimulation  Airway & Oxygen Therapy:Patient Spontanous Breathing and Patient connected to face mask oxgen  Post-op Assessment:  Report given to PACU RN and Post -op Vital signs reviewed and stable  Post vital signs:  Reviewed and stable  Last Vitals:  Vitals:   12/29/21 0608 12/29/21 0905  BP: (!) 165/96   Pulse:    Resp:    Temp:  (!) 36.3 C  SpO2:      Complications: No apparent anesthesia complications

## 2021-12-29 NOTE — Op Note (Signed)
Procedure(s): REVERSE SHOULDER ARTHROPLASTY Procedure Note  [place] Ice. male 76 y.o. 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 male  With endstage left shoulder arthritis with i severe bone loss. Pain and dysfunction interfered with quality of life and nonoperative treatment with activity modification, NSAIDS and injections failed.     Surgeon: Rhae Hammock   Assistants: Sheryle Hail PA-C Amber was present and scrubbed throughout the procedure and was essential in positioning, retraction, exposure, and closure)  Anesthesia: General endotracheal anesthesia With preoperative interscalene block given by the attending anesthesiologist     Procedure Detail  REVERSE SHOULDER ARTHROPLASTY   Estimated Blood Loss:  200 mL         Drains: none  Blood Given: none          Specimens: none        Complications:  * No complications entered in OR log *         Disposition: PACU - hemodynamically stable.         Condition: stable      OPERATIVE FINDINGS:  A Tornier augmented glenoid full wedge reverse total shoulder arthroplasty was placed with a  size 3+ stem, a 42 glenosphere, and a standard retentive-mm poly insert. The base plate  fixation was excellent.  PROCEDURE: The patient was identified in the preoperative holding area  where I personally marked the operative site after verifying site, side,  and procedure with the patient. An interscalene block given by  the attending anesthesiologist in the holding area and the patient was taken back to the operating room where all extremities were  carefully padded in position after general anesthesia was induced. She  was placed in a beach-chair position and the operative upper extremity was  prepped and draped in a  standard sterile fashion. An approximately 10-  cm incision was made from the tip of the coracoid process to the center  point of the humerus at the level of the axilla. Dissection was carried  down through subcutaneous tissues to the level of the cephalic vein  which was taken laterally with the deltoid. The pectoralis major was  retracted medially. The subdeltoid space was developed and the lateral  edge of the conjoined tendon was identified. The undersurface of  conjoined tendon was palpated and the musculocutaneous nerve was not in  the field. Retractor was placed underneath the conjoined and second  retractor was placed lateral into the deltoid. The circumflex humeral  artery and vessels were identified and clamped and coagulated. The  biceps tendon was tenodesed to the upper border of the pectoralis major.  The subscapularis was taken down as a peel with the underlying capsule.  The  joint was then gently externally rotated while the capsule was released  from the humeral neck around to just beyond the 6 o'clock position. At  this point, the joint was dislocated and the humeral head was presented  into the wound. The excessive osteophyte formation was removed with a  large rongeur.  The cutting guide was used to make the appropriate  head cut and the head was saved for potentially bone grafting.  The glenoid was exposed with the arm in an  abducted extended position. The anterior and posterior labrum were  completely excised and the capsule was released circumferentially to  allow for  exposure of the glenoid for preparation.  The augmented guide was placed in the appropriate position and the guidepin was placed.  The central drill was then used followed by the reamer.  The augmented implant was then impacted with a nice fit. The peripheral guide was then used to drilled measured and filled peripheral locking screws. The size 42 glenosphere was then impacted on the [facility] taper and the  central screw was placed. The humerus was then again exposed and the diaphyseal reamers were used followed by the metaphyseal reamers. The final broach was left in place in the proximal trial was placed. The joint was reduced and with this implant it was felt that soft tissue tensioning was appropriate with excellent stability and excellent range of motion. Therefore, final humeral stem was placed press-fit.  And then the trial polyethylene inserts were tested again and the above implant was felt to be the most appropriate for final insertion. The joint was reduced taken through full range of motion and felt to be stable. Soft tissue tension was appropriate.  The joint was then copiously irrigated with pulse  lavage and the wound was then closed. The subscapularis was not repaired.  Skin was closed with 2-0 Vicryl in a deep dermal layer and 4-0  Monocryl for skin closure. Steri-Strips were applied. Sterile  dressings were then applied as well as a sling. The patient was allowed  to awaken from general anesthesia, transferred to stretcher, and taken  to recovery room in stable condition.   POSTOPERATIVE PLAN: The patient will be observed in the recovery room and if his pain is well-controlled and he is hemodynamically stable he could be discharged home today with his wife.  If there is any concern we can keep him overnight for observation.

## 2022-01-05 ENCOUNTER — Encounter (HOSPITAL_COMMUNITY): Payer: Self-pay | Admitting: Orthopedic Surgery

## 2022-01-17 IMAGING — DX DG ABDOMEN 1V
2 series · 2 of 2 positions shown · non-contrast
Comparison: None.

CLINICAL DATA: Constipation.

EXAM:
ABDOMEN - 1 VIEW

[abdomen kub (1 of 2)]
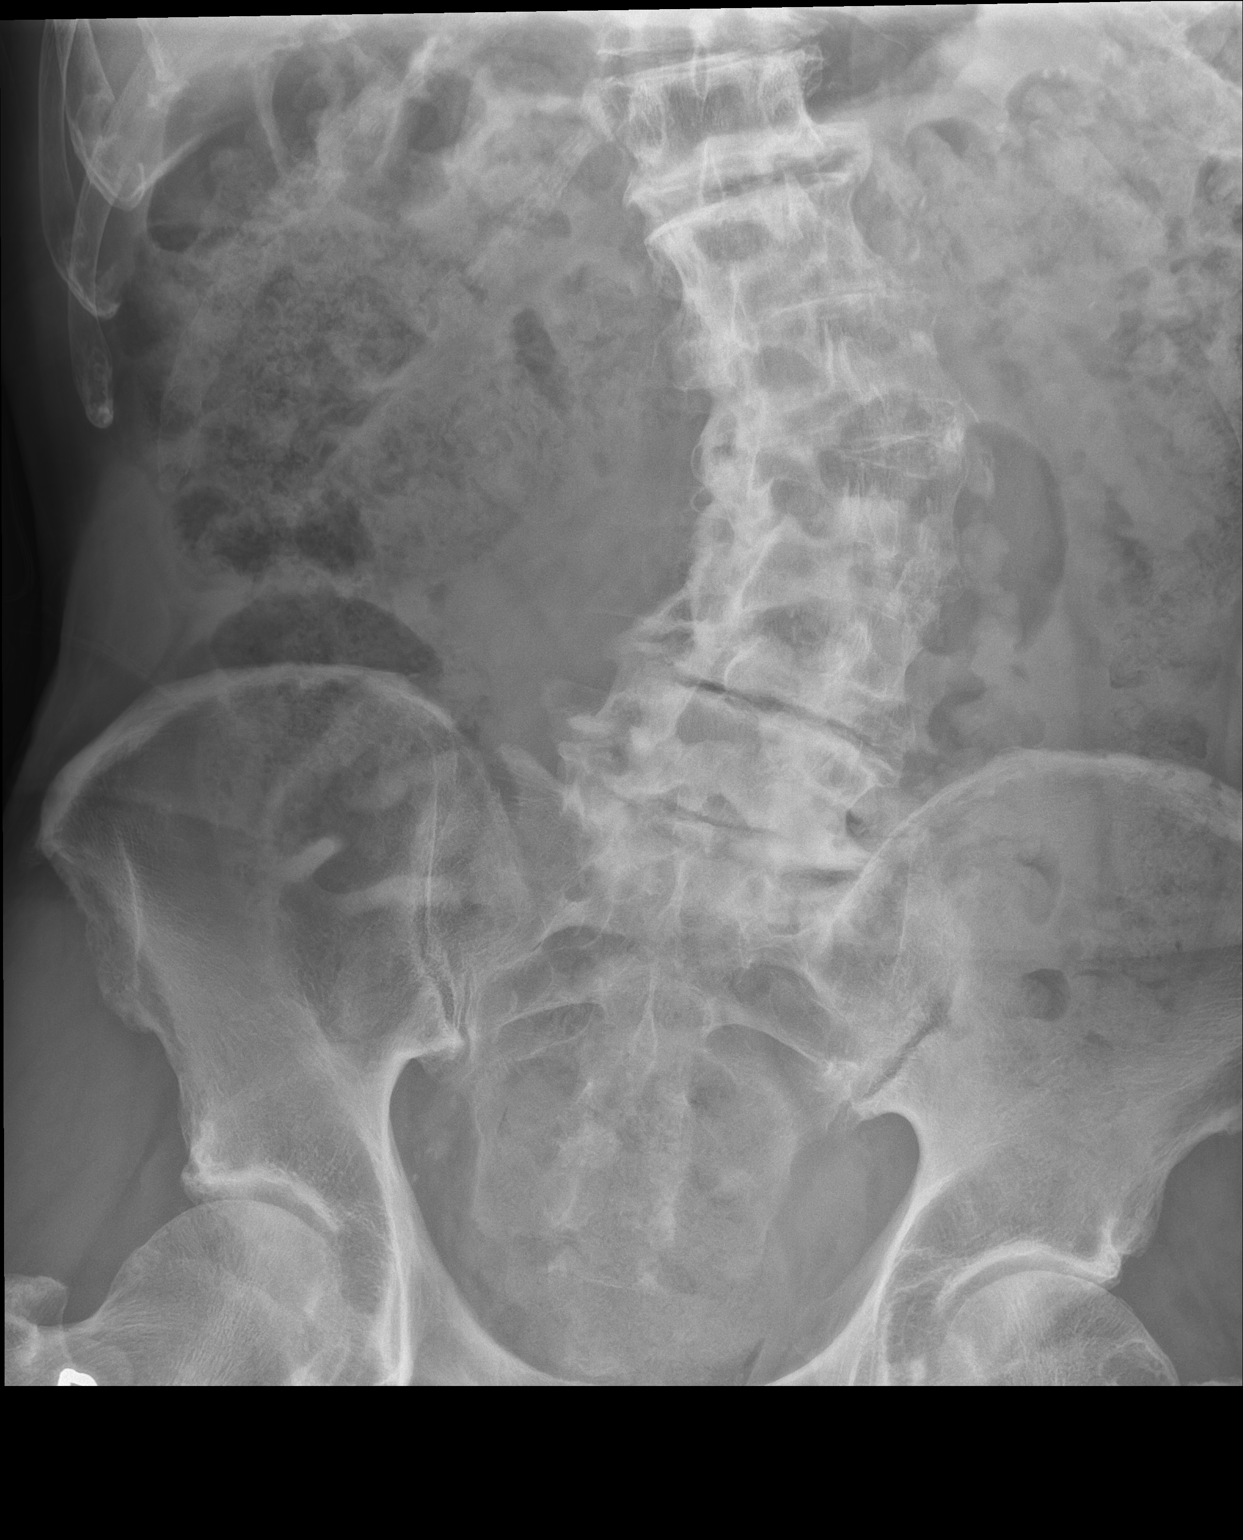

[abdomen kub (2 of 2)]
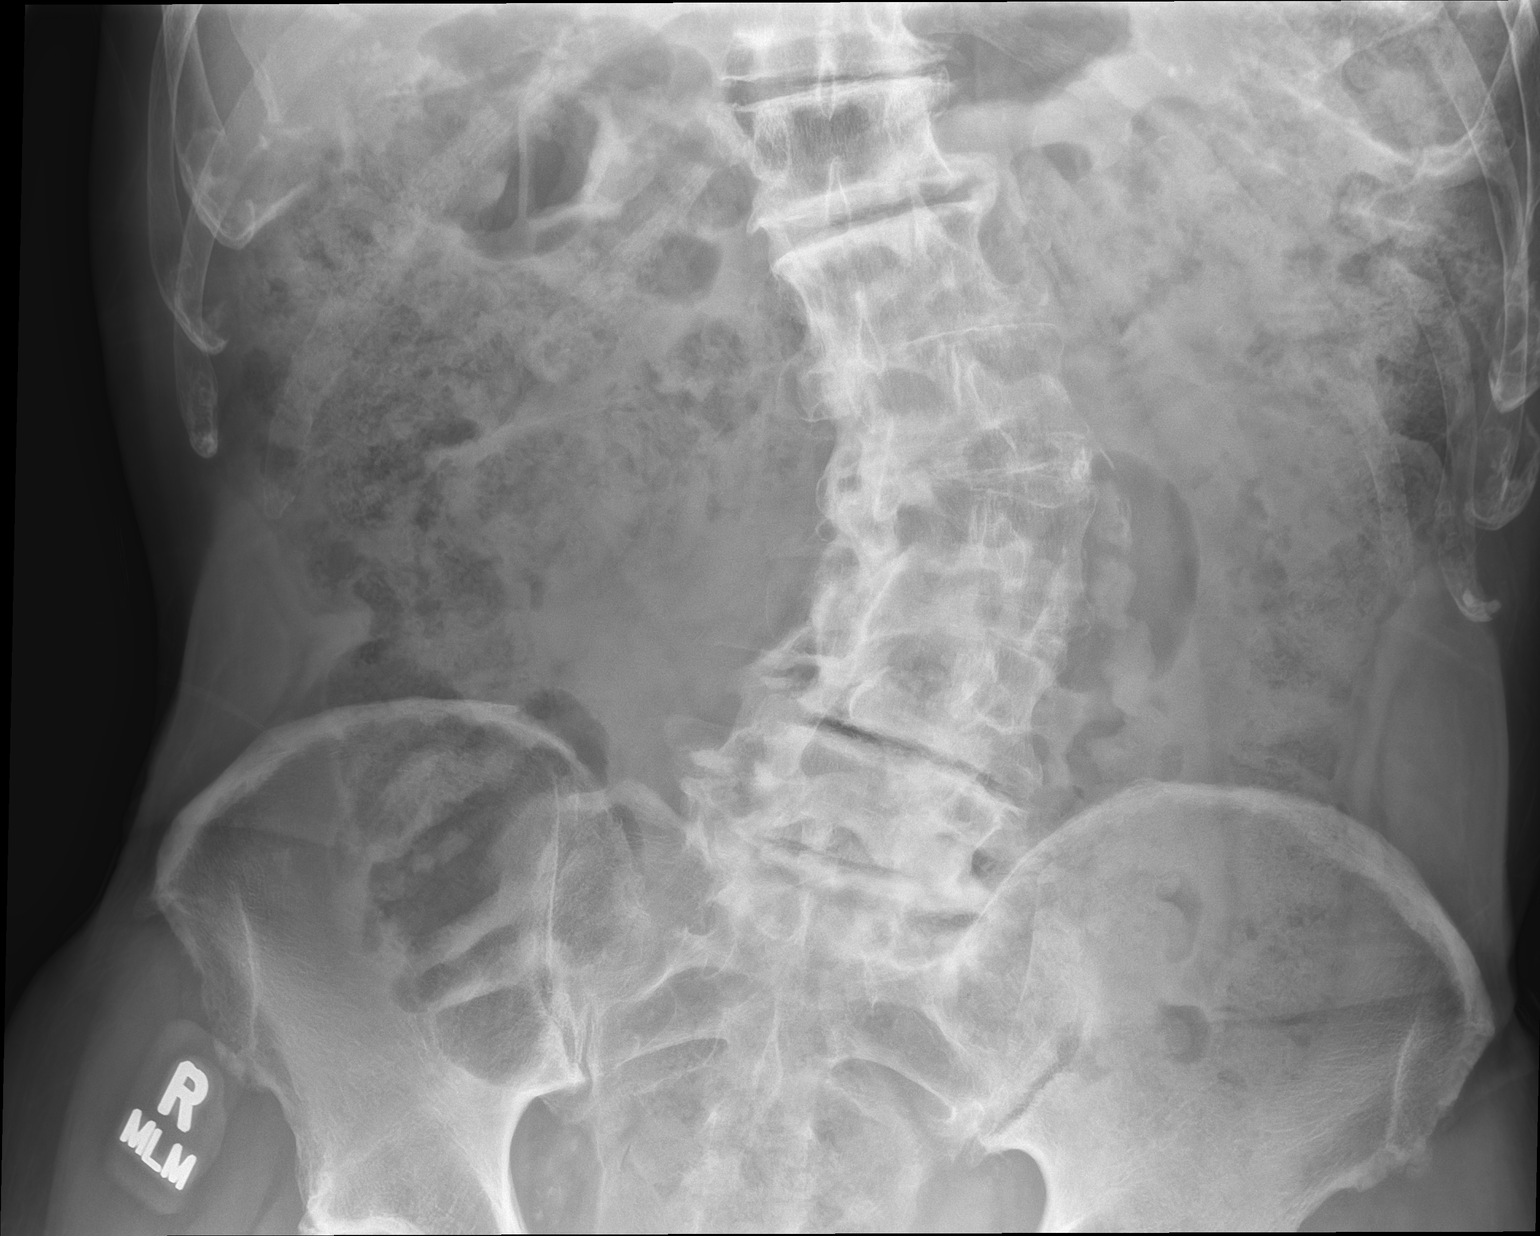

[2 of 2 positions shown; findings below may reference images not displayed]

FINDINGS: No abnormal bowel dilatation is noted. Moderate amount of stool seen
throughout the colon. No radio-opaque calculi or other significant
radiographic abnormality are seen.
IMPRESSION: Moderate stool burden.  No abnormal bowel dilatation is noted.

## 2022-01-20 ENCOUNTER — Other Ambulatory Visit: Payer: Self-pay | Admitting: Internal Medicine

## 2022-01-31 ENCOUNTER — Other Ambulatory Visit: Payer: Self-pay | Admitting: Neurology

## 2022-01-31 DIAGNOSIS — G20A1 Parkinson's disease without dyskinesia, without mention of fluctuations: Secondary | ICD-10-CM

## 2022-02-08 DIAGNOSIS — Z9889 Other specified postprocedural states: Secondary | ICD-10-CM | POA: Diagnosis not present

## 2022-02-13 DIAGNOSIS — M25612 Stiffness of left shoulder, not elsewhere classified: Secondary | ICD-10-CM | POA: Diagnosis not present

## 2022-02-13 DIAGNOSIS — Z96612 Presence of left artificial shoulder joint: Secondary | ICD-10-CM | POA: Diagnosis not present

## 2022-02-16 DIAGNOSIS — Z96612 Presence of left artificial shoulder joint: Secondary | ICD-10-CM | POA: Diagnosis not present

## 2022-02-16 DIAGNOSIS — M25612 Stiffness of left shoulder, not elsewhere classified: Secondary | ICD-10-CM | POA: Diagnosis not present

## 2022-02-21 DIAGNOSIS — M25612 Stiffness of left shoulder, not elsewhere classified: Secondary | ICD-10-CM | POA: Diagnosis not present

## 2022-02-21 DIAGNOSIS — Z96612 Presence of left artificial shoulder joint: Secondary | ICD-10-CM | POA: Diagnosis not present

## 2022-02-24 DIAGNOSIS — Z96612 Presence of left artificial shoulder joint: Secondary | ICD-10-CM | POA: Diagnosis not present

## 2022-02-24 DIAGNOSIS — M25612 Stiffness of left shoulder, not elsewhere classified: Secondary | ICD-10-CM | POA: Diagnosis not present

## 2022-02-28 DIAGNOSIS — M25612 Stiffness of left shoulder, not elsewhere classified: Secondary | ICD-10-CM | POA: Diagnosis not present

## 2022-02-28 DIAGNOSIS — Z96612 Presence of left artificial shoulder joint: Secondary | ICD-10-CM | POA: Diagnosis not present

## 2022-03-02 DIAGNOSIS — M25612 Stiffness of left shoulder, not elsewhere classified: Secondary | ICD-10-CM | POA: Diagnosis not present

## 2022-03-02 DIAGNOSIS — Z96612 Presence of left artificial shoulder joint: Secondary | ICD-10-CM | POA: Diagnosis not present

## 2022-03-07 DIAGNOSIS — M25612 Stiffness of left shoulder, not elsewhere classified: Secondary | ICD-10-CM | POA: Diagnosis not present

## 2022-03-07 DIAGNOSIS — Z96612 Presence of left artificial shoulder joint: Secondary | ICD-10-CM | POA: Diagnosis not present

## 2022-03-09 DIAGNOSIS — Z96612 Presence of left artificial shoulder joint: Secondary | ICD-10-CM | POA: Diagnosis not present

## 2022-03-09 DIAGNOSIS — M25612 Stiffness of left shoulder, not elsewhere classified: Secondary | ICD-10-CM | POA: Diagnosis not present

## 2022-03-13 DIAGNOSIS — M25612 Stiffness of left shoulder, not elsewhere classified: Secondary | ICD-10-CM | POA: Diagnosis not present

## 2022-03-13 DIAGNOSIS — Z96612 Presence of left artificial shoulder joint: Secondary | ICD-10-CM | POA: Diagnosis not present

## 2022-03-16 DIAGNOSIS — Z96612 Presence of left artificial shoulder joint: Secondary | ICD-10-CM | POA: Diagnosis not present

## 2022-03-16 DIAGNOSIS — M25612 Stiffness of left shoulder, not elsewhere classified: Secondary | ICD-10-CM | POA: Diagnosis not present

## 2022-03-22 DIAGNOSIS — M25612 Stiffness of left shoulder, not elsewhere classified: Secondary | ICD-10-CM | POA: Diagnosis not present

## 2022-03-22 DIAGNOSIS — Z96612 Presence of left artificial shoulder joint: Secondary | ICD-10-CM | POA: Diagnosis not present

## 2022-03-24 DIAGNOSIS — Z96612 Presence of left artificial shoulder joint: Secondary | ICD-10-CM | POA: Diagnosis not present

## 2022-03-24 DIAGNOSIS — M25612 Stiffness of left shoulder, not elsewhere classified: Secondary | ICD-10-CM | POA: Diagnosis not present

## 2022-03-27 DIAGNOSIS — M25612 Stiffness of left shoulder, not elsewhere classified: Secondary | ICD-10-CM | POA: Diagnosis not present

## 2022-03-27 DIAGNOSIS — Z96612 Presence of left artificial shoulder joint: Secondary | ICD-10-CM | POA: Diagnosis not present

## 2022-03-29 DIAGNOSIS — Z96612 Presence of left artificial shoulder joint: Secondary | ICD-10-CM | POA: Diagnosis not present

## 2022-03-29 DIAGNOSIS — M25612 Stiffness of left shoulder, not elsewhere classified: Secondary | ICD-10-CM | POA: Diagnosis not present

## 2022-04-03 DIAGNOSIS — M25612 Stiffness of left shoulder, not elsewhere classified: Secondary | ICD-10-CM | POA: Diagnosis not present

## 2022-04-03 DIAGNOSIS — Z96612 Presence of left artificial shoulder joint: Secondary | ICD-10-CM | POA: Diagnosis not present

## 2022-04-04 ENCOUNTER — Encounter: Payer: Self-pay | Admitting: Internal Medicine

## 2022-04-04 NOTE — Patient Instructions (Addendum)
Blood work was ordered.   The lab is on the first floor.    Medications changes include :   none     Return in about 1 year (around 04/05/2023) for Physical Exam.   Health Maintenance, Male Adopting a healthy lifestyle and getting preventive care are important in promoting health and wellness. Ask your health care provider about: The right schedule for you to have regular tests and exams. Things you can do on your own to prevent diseases and keep yourself healthy. What should I know about diet, weight, and exercise? Eat a healthy diet  Eat a diet that includes plenty of vegetables, fruits, low-fat dairy products, and lean protein. Do not eat a lot of foods that are high in solid fats, added sugars, or sodium. Maintain a healthy weight Body mass index (BMI) is a measurement that can be used to identify possible weight problems. It estimates body fat based on height and weight. Your health care provider can help determine your BMI and help you achieve or maintain a healthy weight. Get regular exercise Get regular exercise. This is one of the most important things you can do for your health. Most adults should: Exercise for at least 150 minutes each week. The exercise should increase your heart rate and make you sweat (moderate-intensity exercise). Do strengthening exercises at least twice a week. This is in addition to the moderate-intensity exercise. Spend less time sitting. Even light physical activity can be beneficial. Watch cholesterol and blood lipids Have your blood tested for lipids and cholesterol at 79 years of age, then have this test every 5 years. You may need to have your cholesterol levels checked more often if: Your lipid or cholesterol levels are high. You are older than 79 years of age. You are at high risk for heart disease. What should I know about cancer screening? Many types of cancers can be detected early and may often be prevented. Depending on your  health history and family history, you may need to have cancer screening at various ages. This may include screening for: Colorectal cancer. Prostate cancer. Skin cancer. Lung cancer. What should I know about heart disease, diabetes, and high blood pressure? Blood pressure and heart disease High blood pressure causes heart disease and increases the risk of stroke. This is more likely to develop in people who have high blood pressure readings or are overweight. Talk with your health care provider about your target blood pressure readings. Have your blood pressure checked: Every 3-5 years if you are 93-56 years of age. Every year if you are 55 years old or older. If you are between the ages of 93 and 63 and are a current or former smoker, ask your health care provider if you should have a one-time screening for abdominal aortic aneurysm (AAA). Diabetes Have regular diabetes screenings. This checks your fasting blood sugar level. Have the screening done: Once every three years after age 40 if you are at a normal weight and have a low risk for diabetes. More often and at a younger age if you are overweight or have a high risk for diabetes. What should I know about preventing infection? Hepatitis B If you have a higher risk for hepatitis B, you should be screened for this virus. Talk with your health care provider to find out if you are at risk for hepatitis B infection. Hepatitis C Blood testing is recommended for: Everyone born from [birth year] through 1967. Anyone with known risk  factors for hepatitis C. Sexually transmitted infections (STIs) You should be screened each year for STIs, including gonorrhea and chlamydia, if: You are sexually active and are younger than 79 years of age. You are older than 79 years of age and your health care provider tells you that you are at risk for this type of infection. Your sexual activity has changed since you were last screened, and you are at increased risk  for chlamydia or gonorrhea. Ask your health care provider if you are at risk. Ask your health care provider about whether you are at high risk for HIV. Your health care provider may recommend a prescription medicine to help prevent HIV infection. If you choose to take medicine to prevent HIV, you should first get tested for HIV. You should then be tested every 3 months for as long as you are taking the medicine. Follow these instructions at home: Alcohol use Do not drink alcohol if your health care provider tells you not to drink. If you drink alcohol: Limit how much you have to 0-2 drinks a day. Know how much alcohol is in your drink. In the [place] one drink equals one 12 oz bottle of beer (355 mL), one 5 oz glass of wine (148 mL), or one 1 oz glass of hard liquor (44 mL). Lifestyle Do not use any products that contain nicotine or tobacco. These products include cigarettes, chewing tobacco, and vaping devices, such as e-cigarettes. If you need help quitting, ask your health care provider. Do not use street drugs. Do not share needles. Ask your health care provider for help if you need support or information about quitting drugs. General instructions Schedule regular health, dental, and eye exams. Stay current with your vaccines. Tell your health care provider if: You often feel depressed. You have ever been abused or do not feel safe at home. Summary Adopting a healthy lifestyle and getting preventive care are important in promoting health and wellness. Follow your health care provider's instructions about healthy diet, exercising, and getting tested or screened for diseases. Follow your health care provider's instructions on monitoring your cholesterol and blood pressure. This information is not intended to replace advice given to you by your health care provider. Make sure you discuss any questions you have with your health care provider. Document Revised: 05/17/2020 Document Reviewed:  05/17/2020 Elsevier Patient Education  [place].

## 2022-04-04 NOTE — Progress Notes (Unsigned)
      Subjective:    Patient ID: Douglas Hammond., male    DOB: Mar 14, 1945, 77 y.o.   MRN: [id number]     HPI Douglas Johnston is here for follow up of his chronic medical problems.  S/p.   L shoulder replacement 12/23  Medications and allergies reviewed with patient and updated if appropriate.  Current Outpatient Medications on File Prior to Visit  Medication Sig Dispense Refill   alclomethasone (ACLOVATE) 0.05 % cream Apply 1 application  topically daily as needed (psoriasis).     ALLEGRA ALLERGY 180 MG tablet Take 180 mg by mouth daily as needed for allergies.     amLODipine (NORVASC) 5 MG tablet TAKE 1 & 1/2 TABLETS by mouth ONCE DAILY 135 tablet 0   bifidobacterium infantis (ALIGN) capsule Take 1 capsule by mouth daily.     carbidopa-levodopa (SINEMET IR) 25-100 MG tablet TAKE ONE TABLET BY MOUTH THREE TIMES DAILY 270 tablet 0   hydrocortisone 2.5 % cream Apply 1 Application topically 2 (two) times daily as needed (psoriasis).     Multiple Vitamins-Minerals (CENTRUM SILVER 50+MEN) TABS Take 1 tablet by mouth daily.     polyethylene glycol (MIRALAX / GLYCOLAX) 17 g packet Take 17 g by mouth daily.     triamcinolone (KENALOG) 0.1 % Apply 1 Application topically daily as needed (psoriasis).     No current facility-administered medications on file prior to visit.     Review of Systems     Objective:  There were no vitals filed for this visit. BP Readings from Last 3 Encounters:  12/29/21 (!) 148/74  12/27/21 114/70  12/23/21 (!) 141/85   Wt Readings from Last 3 Encounters:  12/29/21 159 lb 12.8 oz (72.5 kg)  12/27/21 159 lb 12.8 oz (72.5 kg)  12/23/21 158 lb 12.8 oz (72 kg)   There is no height or weight on file to calculate BMI.    Physical Exam     Lab Results  Component Value Date   WBC 7.2 12/23/2021   HGB 13.5 12/23/2021   HCT 41.5 12/23/2021   PLT 223 12/23/2021   GLUCOSE 99 12/23/2021   CHOL 161 03/24/2021   TRIG 54.0 03/24/2021   HDL 60.60  03/24/2021   LDLCALC 90 03/24/2021   ALT 5 03/24/2021   AST 19 03/24/2021   NA 137 12/23/2021   K 4.3 12/23/2021   CL 104 12/23/2021   CREATININE 0.79 12/23/2021   BUN 23 12/23/2021   CO2 27 12/23/2021   TSH 3.94 03/24/2021   HGBA1C 5.5 12/23/2021     Assessment & Plan:    See Problem List for Assessment and Plan of chronic medical problems.

## 2022-04-05 ENCOUNTER — Encounter: Payer: Self-pay | Admitting: Internal Medicine

## 2022-04-05 ENCOUNTER — Ambulatory Visit (INDEPENDENT_AMBULATORY_CARE_PROVIDER_SITE_OTHER): Payer: Medicare Other | Admitting: Internal Medicine

## 2022-04-05 VITALS — BP 132/78 | HR 67 | Temp 98.2°F | Ht 72.0 in | Wt 162.0 lb

## 2022-04-05 DIAGNOSIS — E7849 Other hyperlipidemia: Secondary | ICD-10-CM | POA: Diagnosis not present

## 2022-04-05 DIAGNOSIS — Z0000 Encounter for general adult medical examination without abnormal findings: Secondary | ICD-10-CM

## 2022-04-05 DIAGNOSIS — G20A1 Parkinson's disease without dyskinesia, without mention of fluctuations: Secondary | ICD-10-CM | POA: Diagnosis not present

## 2022-04-05 DIAGNOSIS — R7989 Other specified abnormal findings of blood chemistry: Secondary | ICD-10-CM

## 2022-04-05 DIAGNOSIS — M25612 Stiffness of left shoulder, not elsewhere classified: Secondary | ICD-10-CM | POA: Diagnosis not present

## 2022-04-05 DIAGNOSIS — Z96612 Presence of left artificial shoulder joint: Secondary | ICD-10-CM | POA: Diagnosis not present

## 2022-04-05 DIAGNOSIS — R739 Hyperglycemia, unspecified: Secondary | ICD-10-CM

## 2022-04-05 DIAGNOSIS — I10 Essential (primary) hypertension: Secondary | ICD-10-CM | POA: Diagnosis not present

## 2022-04-05 LAB — CBC WITH DIFFERENTIAL/PLATELET
Basophils Absolute: 0 10*3/uL (ref 0.0–0.1)
Basophils Relative: 0.7 % (ref 0.0–3.0)
Eosinophils Absolute: 0 10*3/uL (ref 0.0–0.7)
Eosinophils Relative: 0.6 % (ref 0.0–5.0)
HCT: 39.9 % (ref 39.0–52.0)
Hemoglobin: 13.6 g/dL (ref 13.0–17.0)
Lymphocytes Relative: 29 % (ref 12.0–46.0)
Lymphs Abs: 2 10*3/uL (ref 0.7–4.0)
MCHC: 34 g/dL (ref 30.0–36.0)
MCV: 94.1 fl (ref 78.0–100.0)
Monocytes Absolute: 0.6 10*3/uL (ref 0.1–1.0)
Monocytes Relative: 8.3 % (ref 3.0–12.0)
Neutro Abs: 4.2 10*3/uL (ref 1.4–7.7)
Neutrophils Relative %: 61.4 % (ref 43.0–77.0)
Platelets: 236 10*3/uL (ref 150.0–400.0)
RBC: 4.24 Mil/uL (ref 4.22–5.81)
RDW: 13.2 % (ref 11.5–15.5)
WBC: 6.8 10*3/uL (ref 4.0–10.5)

## 2022-04-05 LAB — COMPREHENSIVE METABOLIC PANEL
ALT: 9 U/L (ref 0–53)
AST: 20 U/L (ref 0–37)
Albumin: 4.5 g/dL (ref 3.5–5.2)
Alkaline Phosphatase: 56 U/L (ref 39–117)
BUN: 18 mg/dL (ref 6–23)
CO2: 31 mEq/L (ref 19–32)
Calcium: 9.4 mg/dL (ref 8.4–10.5)
Chloride: 101 mEq/L (ref 96–112)
Creatinine, Ser: 0.87 mg/dL (ref 0.40–1.50)
GFR: 82.79 mL/min (ref >60.00)
Glucose, Bld: 90 mg/dL (ref 70–99)
Potassium: 3.9 mEq/L (ref 3.5–5.1)
Sodium: 137 mEq/L (ref 135–145)
Total Bilirubin: 0.8 mg/dL (ref 0.2–1.2)
Total Protein: 7.3 g/dL (ref 6.0–8.3)

## 2022-04-05 LAB — HEMOGLOBIN A1C: Hgb A1c MFr Bld: 5.8 % (ref 4.6–6.5)

## 2022-04-05 LAB — TSH: TSH: 3.36 u[IU]/mL (ref 0.35–5.50)

## 2022-04-05 LAB — LIPID PANEL
Cholesterol: 174 mg/dL (ref 0–200)
HDL: 53.1 mg/dL (ref >39.00)
LDL Cholesterol: 96 mg/dL (ref 0–99)
NonHDL: 120.8
Total CHOL/HDL Ratio: 3
Triglycerides: 126 mg/dL (ref 0.0–149.0)
VLDL: 25.2 mg/dL (ref 0.0–40.0)

## 2022-04-05 MED ORDER — AMLODIPINE BESYLATE 5 MG PO TABS: ORAL_TABLET | ORAL | 3 refills | Status: DC

## 2022-04-05 NOTE — Assessment & Plan Note (Signed)
Chronic Blood pressure well controlled CMP Continue amlodipine 2.5 mg daily-will leave prescription written as is so that he can adjust this as needed. Blood pressure has varied based on amount of exercise, other medications, surgery-he will monitor closely at home and adjust medication if needed

## 2022-04-05 NOTE — Progress Notes (Signed)
Subjective:    Patient ID: Douglas Johnston., male    DOB: 04-24-1945, 77 y.o.   MRN: [id number]     HPI Mosa is here for a physical exam and his chronic medical problems.   S/p.   L shoulder replacement 12/23-still doing PT twice a week.  Has seen improvement, but still decreased range of motion.   Taking 2.5 mg of amlodipine nightly-blood pressure has been controlled with this.  Has needed to adjust medication based on surgery, exercise, other medications.  He does monitor at home.  Medications and allergies reviewed with patient and updated if appropriate.  Current Outpatient Medications on File Prior to Visit  Medication Sig Dispense Refill   alclomethasone (ACLOVATE) 0.05 % cream Apply 1 application  topically daily as needed (psoriasis).     ALLEGRA ALLERGY 180 MG tablet Take 180 mg by mouth daily as needed for allergies.     amLODipine (NORVASC) 5 MG tablet TAKE 1 & 1/2 TABLETS by mouth ONCE DAILY 135 tablet 0   bifidobacterium infantis (ALIGN) capsule Take 1 capsule by mouth daily.     carbidopa-levodopa (SINEMET IR) 25-100 MG tablet TAKE ONE TABLET BY MOUTH THREE TIMES DAILY 270 tablet 0   hydrocortisone 2.5 % cream Apply 1 Application topically 2 (two) times daily as needed (psoriasis).     Multiple Vitamins-Minerals (CENTRUM SILVER 50+MEN) TABS Take 1 tablet by mouth daily.     polyethylene glycol (MIRALAX / GLYCOLAX) 17 g packet Take 17 g by mouth daily.     triamcinolone (KENALOG) 0.1 % Apply 1 Application topically daily as needed (psoriasis).     No current facility-administered medications on file prior to visit.    Review of Systems  Constitutional:  Negative for fever.  HENT:  Positive for postnasal drip.   Eyes:  Negative for visual disturbance.  Respiratory:  Positive for cough (Related to PND). Negative for shortness of breath and wheezing.   Cardiovascular:  Negative for chest pain, palpitations and leg swelling.  Gastrointestinal:  Positive  for constipation (miralax). Negative for abdominal pain, blood in stool and diarrhea.       OCC gerd  Genitourinary:  Negative for difficulty urinating and dysuria.  Musculoskeletal:  Negative for arthralgias and back pain.  Skin:  Positive for rash (forehead).  Neurological:  Negative for light-headedness and headaches.  Psychiatric/Behavioral:  Negative for dysphoric mood. The patient is not nervous/anxious.        Objective:   Vitals:   04/05/22 1409  BP: 132/78  Pulse: 67  Temp: 98.2 F (36.8 C)  SpO2: 98%   Filed Weights   04/05/22 1409  Weight: 162 lb (73.5 kg)   Body mass index is 21.97 kg/m.  BP Readings from Last 3 Encounters:  04/05/22 132/78  12/29/21 (!) 148/74  12/27/21 114/70    Wt Readings from Last 3 Encounters:  04/05/22 162 lb (73.5 kg)  12/29/21 159 lb 12.8 oz (72.5 kg)  12/27/21 159 lb 12.8 oz (72.5 kg)      Physical Exam Constitutional: He appears well-developed and well-nourished. No distress.  HENT:  Head: Normocephalic and atraumatic.  Right Ear: External ear normal.  Left Ear: External ear normal.  Normal ear canals and TM b/l  Mouth/Throat: Oropharynx is clear and moist. Eyes: Conjunctivae and EOM are normal.  Neck: Neck supple. No tracheal deviation present. No thyromegaly present.  No carotid bruit  Cardiovascular: Normal rate, regular rhythm, normal heart sounds and intact distal pulses.  No murmur heard.  No lower extremity edema. Pulmonary/Chest: Effort normal and breath sounds normal. No respiratory distress. He has no wheezes. He has no rales.  Abdominal: Soft. He exhibits no distension. There is no tenderness.  Genitourinary: deferred  Lymphadenopathy:   He has no cervical adenopathy.  Skin: Skin is warm and dry. He is not diaphoretic.  Psychiatric: He has a normal mood and affect. His behavior is normal.         Assessment & Plan:   Physical exam: Screening blood work  ordered Exercise   regular  - walking most  days Weight  normal Substance abuse   none   Reviewed recommended immunizations.   Health Maintenance  Topic Date Due   Zoster Vaccines- Shingrix (1 of 2) Never done   DTaP/Tdap/Td (2 - Tdap) 02/15/2020   COVID-19 Vaccine (7 - 2023-24 season) 04/21/2022 (Originally 12/01/2021)   Medicare Annual Wellness (AWV)  11/02/2022   Pneumonia Vaccine 29+ Years old  Completed   INFLUENZA VACCINE  Completed   Hepatitis C Screening  Completed   HPV VACCINES  Aged Out   COLONOSCOPY (Pts 45-29yrs Insurance coverage will need to be confirmed)  Discontinued     See Problem List for Assessment and Plan of chronic medical problems.

## 2022-04-05 NOTE — Assessment & Plan Note (Signed)
Chronic Check a1c Low sugar / carb diet Stressed regular exercise  

## 2022-04-05 NOTE — Assessment & Plan Note (Signed)
History of elevated TSH Check TSH today Clinically euthyroid

## 2022-04-05 NOTE — Assessment & Plan Note (Signed)
Chronic Check lipid panel, CMP, CBC Continue lifestyle control Continue regular exercise and healthy diet

## 2022-04-05 NOTE — Assessment & Plan Note (Signed)
Chronic On Sinemet Management per Dr. Carles Collet

## 2022-04-09 ENCOUNTER — Encounter: Payer: Self-pay | Admitting: Internal Medicine

## 2022-04-10 DIAGNOSIS — M25612 Stiffness of left shoulder, not elsewhere classified: Secondary | ICD-10-CM | POA: Diagnosis not present

## 2022-04-10 DIAGNOSIS — Z96612 Presence of left artificial shoulder joint: Secondary | ICD-10-CM | POA: Diagnosis not present

## 2022-04-13 ENCOUNTER — Encounter: Payer: Self-pay | Admitting: Internal Medicine

## 2022-04-13 DIAGNOSIS — Z96612 Presence of left artificial shoulder joint: Secondary | ICD-10-CM | POA: Diagnosis not present

## 2022-04-13 DIAGNOSIS — M25612 Stiffness of left shoulder, not elsewhere classified: Secondary | ICD-10-CM | POA: Diagnosis not present

## 2022-04-13 NOTE — Progress Notes (Signed)
Subjective:    Patient ID: Douglas Johnston., male    DOB: Oct 02, 1945, 77 y.o.   MRN: [id number]      HPI Douglas Johnston is here for  Chief Complaint  Patient presents with   Ear Pain    Slight drainage; pain     Right ear pain - he has a lot of moisture in the right ear.  The ear canal was swollen several days ago because he could not put his finger in.  The ear kept getting clogged / unclogged.    No real pain now in the ear.  He gets a little of the congestion and feels like the ear is filling up with fluid. At one point he was not hearing anything in the ear - hearing is better but still not good but has chronic hearing loss.   He has not seen any more blood from the ear.      Medications and allergies reviewed with patient and updated if appropriate.  Current Outpatient Medications on File Prior to Visit  Medication Sig Dispense Refill   alclomethasone (ACLOVATE) 0.05 % cream Apply 1 application  topically daily as needed (psoriasis).     ALLEGRA ALLERGY 180 MG tablet Take 180 mg by mouth daily as needed for allergies.     amLODipine (NORVASC) 5 MG tablet TAKE 1 & 1/2 TABLETS by mouth ONCE DAILY 135 tablet 3   bifidobacterium infantis (ALIGN) capsule Take 1 capsule by mouth daily.     carbidopa-levodopa (SINEMET IR) 25-100 MG tablet TAKE ONE TABLET BY MOUTH THREE TIMES DAILY 270 tablet 0   hydrocortisone 2.5 % cream Apply 1 Application topically 2 (two) times daily as needed (psoriasis).     Multiple Vitamins-Minerals (CENTRUM SILVER 50+MEN) TABS Take 1 tablet by mouth daily.     polyethylene glycol (MIRALAX / GLYCOLAX) 17 g packet Take 17 g by mouth daily.     triamcinolone (KENALOG) 0.1 % Apply 1 Application topically daily as needed (psoriasis).     No current facility-administered medications on file prior to visit.    Review of Systems  Constitutional:  Negative for fever.  HENT:  Positive for congestion (allergies), ear discharge, ear pain and hearing loss  (Chronic-slightly worsened earlier than baseline.).   Neurological:  Negative for dizziness and headaches.       Objective:   Vitals:   04/14/22 0935  BP: 136/80  Pulse: 65  Temp: 98.1 F (36.7 C)  SpO2: 94%   BP Readings from Last 3 Encounters:  04/14/22 136/80  04/05/22 132/78  12/29/21 (!) 148/74   Wt Readings from Last 3 Encounters:  04/14/22 159 lb (72.1 kg)  04/05/22 162 lb (73.5 kg)  12/29/21 159 lb 12.8 oz (72.5 kg)   Body mass index is 21.56 kg/m.    Physical Exam Constitutional:      General: He is not in acute distress.    Appearance: Normal appearance. He is not ill-appearing.  HENT:     Head: Normocephalic and atraumatic.     Left Ear: Tympanic membrane, ear canal and external ear normal. There is no impacted cerumen.     Ears:     Comments: Right ear canal with moderate erythema and mild swelling, discharge located in distal ear canal consistent with otitis externa.  TM not visualized Eyes:     Conjunctiva/sclera: Conjunctivae normal.  Musculoskeletal:     Cervical back: Neck supple. No tenderness.  Lymphadenopathy:     Cervical: No cervical adenopathy.  Skin:    General: Skin is warm and dry.  Neurological:     Mental Status: He is alert.            Assessment & Plan:    See Problem List for Assessment and Plan of chronic medical problems.

## 2022-04-14 ENCOUNTER — Ambulatory Visit (INDEPENDENT_AMBULATORY_CARE_PROVIDER_SITE_OTHER): Payer: Medicare Other | Admitting: Internal Medicine

## 2022-04-14 VITALS — BP 136/80 | HR 65 | Temp 98.1°F | Ht 72.0 in | Wt 159.0 lb

## 2022-04-14 DIAGNOSIS — H60391 Other infective otitis externa, right ear: Secondary | ICD-10-CM

## 2022-04-14 DIAGNOSIS — H6090 Unspecified otitis externa, unspecified ear: Secondary | ICD-10-CM | POA: Insufficient documentation

## 2022-04-14 MED ORDER — NEOMYCIN-POLYMYXIN-HC 3.5-10000-1 OT SOLN: 4.0000 [drp] | Freq: Four times a day (QID) | OTIC | 0 refills | Status: DC

## 2022-04-14 NOTE — Patient Instructions (Addendum)
        Medications changes include :  ear drops for outer ear infection     Return if symptoms worsen or fail to improve.

## 2022-04-14 NOTE — Assessment & Plan Note (Signed)
Acute Exam and symptoms consistent with otitis externa Start Cortisporin otic drops-4 drops 4 times daily in right ear for 5-7 days If no improvement I asked him to come back so I can look in the ear

## 2022-04-18 DIAGNOSIS — M25612 Stiffness of left shoulder, not elsewhere classified: Secondary | ICD-10-CM | POA: Diagnosis not present

## 2022-04-18 DIAGNOSIS — Z96612 Presence of left artificial shoulder joint: Secondary | ICD-10-CM | POA: Diagnosis not present

## 2022-04-24 DIAGNOSIS — L578 Other skin changes due to chronic exposure to nonionizing radiation: Secondary | ICD-10-CM | POA: Diagnosis not present

## 2022-04-24 DIAGNOSIS — M25612 Stiffness of left shoulder, not elsewhere classified: Secondary | ICD-10-CM | POA: Diagnosis not present

## 2022-04-24 DIAGNOSIS — L738 Other specified follicular disorders: Secondary | ICD-10-CM | POA: Diagnosis not present

## 2022-04-24 DIAGNOSIS — L570 Actinic keratosis: Secondary | ICD-10-CM | POA: Diagnosis not present

## 2022-04-24 DIAGNOSIS — L821 Other seborrheic keratosis: Secondary | ICD-10-CM | POA: Diagnosis not present

## 2022-04-24 DIAGNOSIS — Z85828 Personal history of other malignant neoplasm of skin: Secondary | ICD-10-CM | POA: Diagnosis not present

## 2022-04-24 DIAGNOSIS — Z96612 Presence of left artificial shoulder joint: Secondary | ICD-10-CM | POA: Diagnosis not present

## 2022-04-24 DIAGNOSIS — L218 Other seborrheic dermatitis: Secondary | ICD-10-CM | POA: Diagnosis not present

## 2022-04-25 DIAGNOSIS — M25612 Stiffness of left shoulder, not elsewhere classified: Secondary | ICD-10-CM | POA: Diagnosis not present

## 2022-04-25 DIAGNOSIS — Z96612 Presence of left artificial shoulder joint: Secondary | ICD-10-CM | POA: Diagnosis not present

## 2022-04-27 DIAGNOSIS — M25612 Stiffness of left shoulder, not elsewhere classified: Secondary | ICD-10-CM | POA: Diagnosis not present

## 2022-04-27 DIAGNOSIS — Z96612 Presence of left artificial shoulder joint: Secondary | ICD-10-CM | POA: Diagnosis not present

## 2022-05-01 DIAGNOSIS — Z96612 Presence of left artificial shoulder joint: Secondary | ICD-10-CM | POA: Diagnosis not present

## 2022-05-01 DIAGNOSIS — M25612 Stiffness of left shoulder, not elsewhere classified: Secondary | ICD-10-CM | POA: Diagnosis not present

## 2022-05-01 NOTE — Progress Notes (Unsigned)
Assessment/Plan:   1.  Parkinsons Disease  -Increase carbidopa/levodopa 25/100, 1.5 tablet 3 times per day.    -he is doing great with exercise.  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  They would like to maintain care here.   2.  History of melanoma  -Patient follows with dermatology.  -Patient understands that Parkinson's also slightly increases risk for melanoma.  He follows with Dr. Yetta Barre.  3.  Heart block  -pt following with cardiology.  4.  Low BP  -On amlodipine, 2.5 mg with PCP managing.  He did not take that today.    -discussed concept of permissive HTN  -discussed may need abdominal compression binder and/or stockings with exercise if holding his BP meds doesn't help  -increase hydration 5.  REM behavior disorder  -This is commonly associated with PD and the patient is experiencing this.  We discussed that this can be very serious and even harmful.  We talked about medications as well as physical barriers to put in the bed (particularly soft bed rails, pillow barriers).  We talked about moving the night stand so that it is not so close to the side of the bed.   Subjective:   [name]. was seen today in follow up for Parkinsons disease.  My previous records were reviewed prior to todays visit as well as outside records available to me.   Pt with wife who supplements hx. 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  He's finishing in PT.  He has had no falls since last visit but he does state that he "slipped" out of the bed.  He scooted out of the bed and ended up catching the night stand with his back.  No lightheadedness or near syncope.  When I spoke with him last, they had planned on moving to [facility] soon and he reports that they are targeting late fall.  The house there is finishing.  He is shuffling a bit  more and a bit more tremor.  Current prescribed movement disorder medications: Carbidopa/levodopa 25/100, 1 tablet at 7 AM/11 AM/4 PM   PREVIOUS MEDICATIONS:  propranolol 40 mg 3 times per day (bradycardia); primidone 100 mg twice daily (with Dr. Epimenio Foot); levodopa  ALLERGIES:   Allergies  Allergen Reactions   Tessalon [Benzonatate] Shortness Of Breath and Cough    Inc cough, wheeze, SOB    CURRENT MEDICATIONS:  Outpatient Encounter Medications as of 05/02/2022  Medication Sig   alclomethasone (ACLOVATE) 0.05 % cream Apply 1 application  topically daily as needed (psoriasis).   ALLEGRA ALLERGY 180 MG tablet Take 180 mg by mouth daily as needed for allergies.   amLODipine (NORVASC) 5 MG tablet TAKE 1 & 1/2 TABLETS by mouth ONCE DAILY   bifidobacterium infantis (ALIGN) capsule Take 1 capsule by mouth daily.   carbidopa-levodopa (SINEMET IR) 25-100 MG tablet TAKE ONE TABLET BY MOUTH THREE TIMES DAILY   hydrocortisone 2.5 % cream Apply 1 Application topically 2 (two) times daily as needed (psoriasis).   Multiple Vitamins-Minerals (CENTRUM SILVER 50+MEN) TABS Take 1 tablet by mouth daily.   neomycin-polymyxin-hydrocortisone (CORTISPORIN) OTIC solution Place 4 drops into the right ear 4 (four) times daily. Use for 5-7 days   polyethylene glycol (MIRALAX / GLYCOLAX) 17 g packet Take 17 g by mouth daily.   triamcinolone (KENALOG) 0.1 % Apply 1 Application  topically daily as needed (psoriasis).   No facility-administered encounter medications on file as of 05/02/2022.    Objective:   PHYSICAL EXAMINATION:    VITALS:   Vitals:   05/02/22 1240  BP: 124/84  Pulse: 71  SpO2: 99%  Weight: 163 lb (73.9 kg)  Height:  (1.803 m)     GEN:  The patient appears stated age and is in NAD. HEENT:  Normocephalic, atraumatic.  The mucous membranes are moist. The superficial temporal arteries are without ropiness or tenderness. CV:  RRR Lungs:  CTAB Neck:  no bruits  Neurological  examination:  Orientation: The patient is alert and oriented x3. Cranial nerves: There is good facial symmetry without facial hypomimia. The speech is fluent and clear. Soft palate rises symmetrically and there is no tongue deviation. Hearing is intact to conversational tone. Sensation: Sensation is intact to light touch throughout Motor: Strength is at least antigravity x4.  Movement examination: Tone: There is mild increased tone in the LUE Abnormal movements: there is mild LUE rest tremor Coordination:  There is no decremation, with any form of RAMS, including alternating supination and pronation of the forearm, hand opening and closing, finger taps, heel taps and toe taps.  Gait and Station: The patient has no difficulty arising out of a deep-seated chair without the use of the hands. The patient's stride length is good, slightly decreased arm swing on the L.    I have reviewed and interpreted the following labs independently    Chemistry      Component Value Date/Time   NA 137 04/05/2022 1525   NA 141 04/03/2019 0924   K 3.9 04/05/2022 1525   CL 101 04/05/2022 1525   CO2 31 04/05/2022 1525   BUN 18 04/05/2022 1525   BUN 20 04/03/2019 0924   CREATININE 0.87 04/05/2022 1525      Component Value Date/Time   CALCIUM 9.4 04/05/2022 1525   ALKPHOS 56 04/05/2022 1525   AST 20 04/05/2022 1525   ALT 9 04/05/2022 1525   BILITOT 0.8 04/05/2022 1525       Lab Results  Component Value Date   WBC 6.8 04/05/2022   HGB 13.6 04/05/2022   HCT 39.9 04/05/2022   MCV 94.1 04/05/2022   PLT 236.0 04/05/2022    Lab Results  Component Value Date   TSH 3.36 04/05/2022   Total time spent on today's visit was 41 minutes, including both face-to-face time and nonface-to-face time.  Time included that spent on review of records (prior notes available to me/labs/imaging if pertinent), discussing treatment and goals, answering patient's questions and coordinating care.  Cc:  Pincus Sanes,  MD

## 2022-05-02 ENCOUNTER — Ambulatory Visit: Payer: Medicare Other | Admitting: Neurology

## 2022-05-02 ENCOUNTER — Encounter: Payer: Self-pay | Admitting: Neurology

## 2022-05-02 VITALS — BP 124/84 | HR 71 | Ht 71.0 in | Wt 163.0 lb

## 2022-05-02 DIAGNOSIS — G4752 REM sleep behavior disorder: Secondary | ICD-10-CM

## 2022-05-02 DIAGNOSIS — G20A1 Parkinson's disease without dyskinesia, without mention of fluctuations: Secondary | ICD-10-CM | POA: Diagnosis not present

## 2022-05-02 MED ORDER — CARBIDOPA-LEVODOPA 25-100 MG PO TABS: 1.5000 | ORAL_TABLET | Freq: Three times a day (TID) | ORAL | 1 refills | Status: DC

## 2022-05-02 NOTE — Patient Instructions (Signed)
Increase carbidopa/levodopa to 1.5 tablets at 7am/11am/4pm  Local and Online Resources for Power over Parkinson's Group  April 2024   LOCAL [place] PARKINSON'S GROUPS   Power over Parkinson's Group:    Power Over Parkinson's Patient Education Group will be Wednesday, April 10th-*Hybrid meting*- in person at [facility] location and via [facility], 2:00-3:00 pm.   Power over [employer] and Care Partner Groups will meet together, with plans for separate break out session for caregivers, depending on topic/speaker Upcoming Power over Parkinson's Meetings/Care Partner Support:  2nd Wednesdays of the month at 2 pm:   April 10th, May 8th Contact Amy Marriott at [email or web address] if interested in participating in this group    LOCAL EVENTS AND NEW OFFERINGS  NEW:  Parkinson's Social Game Night.  First Thursday of each month, 2:00-4:00 pm.  *Next date is April 4th*.  Rossie Muskrat [employer], [employer].  Contact [email or web address] if interested. Parkinson's CarePartner Group for Men is in the works, if interested email [email or web address] ACT FITNESS Chair Yoga classes "Train and Gain", Fridays 10 am, [employer].  Contact Gina at [phone].  Health visitor Classes offering at [employer]!  TUESDAYS (Chair Yoga)  and Wednesdays (PWR! Moves)  1:00 pm.   Contact Synetta Shadow at  [email or web address]  or [phone]  Drumming for Parkinson's will be held on 2nd and 4th Mondays at 11:00 am.   Located at the [place] of the [employer] ([address] [place]  Contact Albertina Parr at [email or web address] or [phone]  Dance for Parkinson 's classes will be on Tuesdays 10-11 am. Located in the [employer], in the first floor of the [employer] ([address] To register:  [email or web address] or [phone] [facility] Parkinson's Tai  Chi Class, Mondays at 11 am.  Call [phone] for details Hamil-Kerr Challenge.  Bike, Run, [employer] for [employer].  Saturday, April 6th at [facility].  To register, visit [email or web address] Moving Day [facility].  Saturday, May 4th, 10 am start.  Register at [email or web address]    ONLINE EDUCATION AND SUPPORT  Parkinson Foundation:  www.parkinson.org  PD Health at Home continues:  Mindfulness Mondays, Wellness Wednesdays, Fitness Fridays  (PWR! Moves as part of Fitness Fridays March 22nd, 1-1:45 pm) Upcoming Education:   Parkinson's 101.  Wednesday, April 3rd, 1-2 pm Movement for Parkinson's.  Wednesday, May 1st, 1-2 pm Expert Briefing:  Research Update:  Working to [employer] PD.  Wednesday, April 10th, 1-2 pm Trouble with Zzz's:  Sleep Challenges with Parkinson's.  Wed, May 8th 1-2 pm Register for virtual education and expert briefings (webinars) at [email or web address].gl Please check out their website to sign up for emails and see their full online offerings     [employer] Foundation:  www.[email or web address]   Third Thursday Webinars:  On the third Thursday of every month at 12 p.m. ET, join our free live webinars to learn about various aspects of living with Parkinson's disease and our work to speed medical breakthroughs.  Upcoming Webinar:  Let's Talk Taboos:  Hard-to-Discuss Parkinson's Symptoms.  Thursday, April 18th at 12 noon. Check out additional information on their website to see their full online offerings    [facility]:  [email or web address]  Upcoming Webinar:   Emergent Therapies.  Wednesday, April 2nd, 4 pm Series:  Living with Parkinson's Meetup.   Third Thursdays each month, 3 pm  Care Partner Monthly Meetup.  With Jillene Bucks [place].  First Tuesday of  each month, 2 pm  Check out additional information to Live Well Today on their website    Parkinson and Movement Disorders  (PMD) Alliance:  www.pmdalliance.org  [employer] Online:  Online Education Events  Sign up for emails, which are sent weekly to give you updates on programming and online offerings    Parkinson's Association of the [place]:  www.[email or web address]  Information on online support groups, education events, and online exercises including Yoga, Parkinson's exercises and more-LOTS of information on links to PD resources and online events  Virtual Support Group through Parkinson's Association of the [place]; next one is scheduled for Wednesday, April 3rd  MOVEMENT AND EXERCISE OPPORTUNITIES  PWR! Moves Classes at [facility] Exercise Room.  Wednesdays 10 and 11 am.   Contact Amy Bernie Covey, PT [email or web address] if interested.  Parkinson's Exercise Class offerings at [employer]. *TUESDAYS* (Chair yoga) and Wednesdays (PWR! Moves)  1:00 pm.    Contact Synetta Shadow at [email or web address]    Parkinson's Wellness Recovery (PWR! Moves)  www.[email or web address]  Info on the PWR! Virtual Experience:  You will have access to our expertise?through self-assessment, guided plans that start with the PD-specific fundamentals, educational content, tips, Q&A with an expert, and a growing Engineering geologist of PD-specific pre-recorded and live exercise classes of varying types and intensity - both physical and cognitive! If that is not enough, we offer 1:1 wellness consultations (in-person or virtual) to personalize your PWR! Dance movement psychotherapist.   Parkinson [employer] Fridays:   As part of the PD Health @ Home program, this free video series focuses each week on one aspect of fitness designed to support people living with Parkinson's.? These weekly videos highlight the Parkinson Foundation fitness guidelines for people with Parkinson's disease.  MenusLocal.com.[email or web address]  Dance for PD website is offering free, live-stream classes throughout the week, as well  as links to [employer] of classes:  [email or web address]  Virtual dance and Pilates for Parkinson's classes: Click on the Community Tab> Parkinson's Movement Initiative Tab.  To register for classes and for more information, visit www.[email or web address].co.za and click the "community" tab.   [employer] Parkinson's Cycling Classes   [facility] [employer]:  Thursdays @ Noon-Live classes at [employer] ([employer] at [email or web address]?or [phone])  [facility]: Classes Tuesday, Wednesday and Thursday (contact [place] at [email or web address] ?or [phone])  [facility] [employer]  Varied levels of classes are offered Tuesdays and Thursdays at [employer].   Stretching with Byrd Hesselbach weekly class is also offered for people with Parkinson's  To observe a class or for more information, call [phone] or email Patricia Nettle at [email or web address]   ADDITIONAL SUPPORT AND RESOURCES  [employer]:  Online Caregiver Education Opportunities:  www.well-springsolutions.org/caregiver-education/caregiver-support-group.  You may also contact Loleta Chance at [facility] -spring.[email or web address] or [phone].     [employer] April [facility] Decisions Day:  The Most Critical Legal and Medical Decisions to Consider Now!  Tuesday, April 16th, 1-3 pm at [facility] at [facility].  Contact Loleta Chance at [facility] -spring.org or [phone] Powerful Tools for Caregivers.  6 week educational series for caregivers.  April 18-May 23, 10:30 am-12:15 pm at [facility] 3rd Floor Conference Room.   Contact Loleta Chance at [facility] -spring.org or [phone] to register Well-Spring Navigator:  Just1Navigator program, a?free service to help individuals and families through the journey of determining care for older adults.  The "Navigator" is a Child psychotherapist, Sidney Ace, who will speak  with a prospective client and/or loved ones to provide an assessment of the situation and  a set of recommendations for a personalized care plan -- all free of charge, and whether?[employer] offers the needed service or not. If the need is not a service we provide, we are well-connected with reputable programs in town that we can refer you to.  www.well-springsolutions.org or to speak with the Navigator, call [phone].

## 2022-05-03 DIAGNOSIS — Z96612 Presence of left artificial shoulder joint: Secondary | ICD-10-CM | POA: Diagnosis not present

## 2022-05-03 DIAGNOSIS — M25612 Stiffness of left shoulder, not elsewhere classified: Secondary | ICD-10-CM | POA: Diagnosis not present

## 2022-05-04 DIAGNOSIS — Z96612 Presence of left artificial shoulder joint: Secondary | ICD-10-CM | POA: Diagnosis not present

## 2022-05-04 DIAGNOSIS — M25612 Stiffness of left shoulder, not elsewhere classified: Secondary | ICD-10-CM | POA: Diagnosis not present

## 2022-05-08 DIAGNOSIS — Z96612 Presence of left artificial shoulder joint: Secondary | ICD-10-CM | POA: Diagnosis not present

## 2022-05-08 DIAGNOSIS — M25612 Stiffness of left shoulder, not elsewhere classified: Secondary | ICD-10-CM | POA: Diagnosis not present

## 2022-05-12 DIAGNOSIS — Z96612 Presence of left artificial shoulder joint: Secondary | ICD-10-CM | POA: Diagnosis not present

## 2022-05-12 DIAGNOSIS — M25612 Stiffness of left shoulder, not elsewhere classified: Secondary | ICD-10-CM | POA: Diagnosis not present

## 2022-05-14 ENCOUNTER — Encounter: Payer: Self-pay | Admitting: Neurology

## 2022-05-17 ENCOUNTER — Telehealth: Payer: Self-pay | Admitting: Internal Medicine

## 2022-05-17 NOTE — Telephone Encounter (Signed)
Spoke to the patient, he is currently taking carbidopa and he is having server lightheadedness. Per his neurologist :   Lets have you get an appt with cardiology first and see what they say.  You can continue the carbidopa/levodopa three times per day for now and we will go from there.   Kerin Salen, DO Director of Movement Disorders [facility]  Patient is scheduled with Dr. Ladona Ridgel on 07/01/22 @ 1000, patient voiced understanding.

## 2022-05-17 NOTE — Telephone Encounter (Signed)
  Per MyChart scheduling message:   Main complaint:  Frequent episodes of lightheadedness potentially related to Parkinson's medication and/or variability of blood pressure. Dr. Arbutus Leas recommended I see Dr. Ladona Ridgel.    Pt c/o BP issue: STAT if pt c/o blurred vision, one-sided weakness or slurred speech  1. What are your last 5 BP readings?   2. Are you having any other symptoms (ex. Dizziness, headache, blurred vision, passed out)?   3. What is your BP issue?   1.  Last 5 readings are from yesterday May 7: 124/69, 108/73, 124/79, 133/86, 188/107 2. Symptoms primarily are lightheadedness (had an episode of lightheadedness yesterday within a half hour of playing non-strenuous pickleball).  Other symptoms include dizziness and light headache on a less frequent basis. 3. Carbidopa is the only medication I am currently taking and this is to treat Parkinson's disease. My neurologist (Dr Tat)   changed my dosage but I was having more frequent episodes of lightheadedness especially during exercise (pickleball).  Several weeks ago I was also taking amlodopine for my blood pressure but was instructed to discontinue this because the combination of carbidopa and amlodopine were resulting in frequent low blood pressure readings and lightheadedness. Without the amlodipine, I am getting higher blood pressure readings but also still getting low readings even though the amlodipine dosage was only 2.5 mg.  Dr Tat suggested we return to the carbidopa dosage before the change until I follow-up with [name]

## 2022-06-08 ENCOUNTER — Ambulatory Visit (INDEPENDENT_AMBULATORY_CARE_PROVIDER_SITE_OTHER): Payer: Medicare Other

## 2022-06-08 ENCOUNTER — Encounter: Payer: Self-pay | Admitting: Internal Medicine

## 2022-06-08 ENCOUNTER — Ambulatory Visit: Payer: Medicare Other | Attending: Internal Medicine | Admitting: Internal Medicine

## 2022-06-08 VITALS — BP 130/80 | HR 62 | Ht 71.0 in | Wt 160.2 lb

## 2022-06-08 DIAGNOSIS — I442 Atrioventricular block, complete: Secondary | ICD-10-CM | POA: Diagnosis not present

## 2022-06-08 DIAGNOSIS — I493 Ventricular premature depolarization: Secondary | ICD-10-CM | POA: Diagnosis not present

## 2022-06-08 DIAGNOSIS — R002 Palpitations: Secondary | ICD-10-CM | POA: Diagnosis not present

## 2022-06-08 DIAGNOSIS — G20A1 Parkinson's disease without dyskinesia, without mention of fluctuations: Secondary | ICD-10-CM | POA: Diagnosis not present

## 2022-06-08 DIAGNOSIS — R0602 Shortness of breath: Secondary | ICD-10-CM | POA: Diagnosis not present

## 2022-06-08 NOTE — Progress Notes (Signed)
HPI Mr. Scholten returns today for ongoing evaluation of bradycardia. He is a pleasant 77 yo man with Parkinsons who I last saw 6 months ago with a h/o HTN, and remote ASD repair. He also has PVC's. He wore a cardiac monitor demonstrating AVWB and periods of 2:1 AV block, mostly at night. He also has frequent PVC's including bigeminy.  He has not had syncope. He has worsening fatigue and sob. He has had lability of his bp and his pulse rate by his bp cuff shows bradycardia. No edema. No frank syncope.  Allergies  Allergen Reactions   Tessalon [Benzonatate] Shortness Of Breath and Cough    Inc cough, wheeze, SOB     Current Outpatient Medications  Medication Sig Dispense Refill   alclomethasone (ACLOVATE) 0.05 % cream Apply 1 application  topically daily as needed (psoriasis).     ALLEGRA ALLERGY 180 MG tablet Take 180 mg by mouth daily as needed for allergies.     bifidobacterium infantis (ALIGN) capsule Take 1 capsule by mouth daily.     carbidopa-levodopa (SINEMET IR) 25-100 MG tablet Take 1.5 tablets by mouth 3 (three) times daily. 405 tablet 1   hydrocortisone 2.5 % cream Apply 1 Application topically 2 (two) times daily as needed (psoriasis).     Multiple Vitamins-Minerals (CENTRUM SILVER 50+MEN) TABS Take 1 tablet by mouth daily.     polyethylene glycol (MIRALAX / GLYCOLAX) 17 g packet Take 17 g by mouth daily.     triamcinolone (KENALOG) 0.1 % Apply 1 Application topically daily as needed (psoriasis).     amLODipine (NORVASC) 5 MG tablet TAKE 1 & 1/2 TABLETS by mouth ONCE DAILY 135 tablet 3   neomycin-polymyxin-hydrocortisone (CORTISPORIN) OTIC solution Place 4 drops into the right ear 4 (four) times daily. Use for 5-7 days 10 mL 0   No current facility-administered medications for this visit.     Past Medical History:  Diagnosis Date   Arthritis    LEFT shoulder   Bowel habit changes 06/2016   on Ketogenic diet/ does not go to the BR but every 3 days   BPH (benign  prostatic hyperplasia)    Cataract 2015   bilateral sx   Complication of anesthesia    slow to wake   Diverticulosis of colon 2008   External hemorrhoids    GERD (gastroesophageal reflux disease)    with certain foods   Gilbert's syndrome    Heart murmur    History of anesthesia complications    Per pt, gets very light-headed past sedation/ fainted 1 time!   Hyperlipidemia    on meds   Hypertension    on meds   Parkinson disease    Pneumonia    Pre-diabetes    Skin cancer    X2 in [place]; now seeing Dr Harrie Jeans    ROS:   All systems reviewed and negative except as noted in the HPI.   Past Surgical History:  Procedure Laterality Date   ASD REPAIR  1961   [place] , [place]- open heart surgery   CATARACT EXTRACTION Bilateral 07/2013   bilateral eyes; Dr Lillia Corporal, [facility]   COLONOSCOPY  2008   [facility]   COLONOSCOPY  2017   JMP-MAC-2 day suprep(good)-polyps   INGUINAL HERNIA REPAIR Left 2006   LUMBAR LAMINECTOMY  1992   [place] , [place]   REVERSE SHOULDER ARTHROPLASTY Left 12/29/2021   Procedure: REVERSE SHOULDER ARTHROPLASTY;  Surgeon: Jones Broom, MD;  Location: [facility];  Service:  Orthopedics;  Laterality: Left;   TONSILLECTOMY     as child   WISDOM TOOTH EXTRACTION       Family History  Problem Relation Age of Onset   Hypertension Mother    Other Mother        intestine blockage-burst   Emphysema Father        smoker   Cancer Neg Hx    Diabetes Neg Hx    Heart disease Neg Hx    Stroke Neg Hx    Colon cancer Neg Hx    Colon polyps Neg Hx    Stomach cancer Neg Hx    Rectal cancer Neg Hx      Social History   Socioeconomic History   Marital status: Married    Spouse name: Charley Lafrance   Number of children: 0   Years of education: BA   Highest education level: Bachelor's degree (e.g., BA, AB, BS)  Occupational History   Occupation: Retired  Tobacco Use   Smoking status: Never   Smokeless tobacco: Never  Vaping Use   Vaping Use:  Never used  Substance and Sexual Activity   Alcohol use: Yes    Comment: Rare   Drug use: No   Sexual activity: Yes  Other Topics Concern   Not on file  Social History Narrative      Caffeine use: coffee daily   Right handed    Social Determinants of Health   Financial Resource Strain: Low Risk  (04/02/2022)   Overall Financial Resource Strain (CARDIA)    Difficulty of Paying Living Expenses: Not hard at all  Food Insecurity: No Food Insecurity (04/02/2022)   Hunger Vital Sign    Worried About Running Out of Food in the Last Year: Never true    Ran Out of Food in the Last Year: Never true  Transportation Needs: No Transportation Needs (04/02/2022)   PRAPARE - Administrator, Civil Service (Medical): No    Lack of Transportation (Non-Medical): No  Physical Activity: Sufficiently Active (04/02/2022)   Exercise Vital Sign    Days of Exercise per Week: 4 days    Minutes of Exercise per Session: 40 min  Stress: No Stress Concern Present (04/02/2022)   [employer] of Occupational Health - Occupational Stress Questionnaire    Feeling of Stress : Only a little  Social Connections: Unknown (04/02/2022)   Social Connection and Isolation Panel [NHANES]    Frequency of Communication with Friends and Family: Once a week    Frequency of Social Gatherings with Friends and Family: Once a week    Attends Religious Services: Patient declined    Database administrator or Organizations: No    Attends Engineer, structural: More than 4 times per year    Marital Status: Married  Catering manager Violence: Not At Risk (11/01/2021)   Humiliation, Afraid, Rape, and Kick questionnaire    Fear of Current or Ex-Partner: No    Emotionally Abused: No    Physically Abused: No    Sexually Abused: No     BP 130/80   Pulse 62   Ht 5\' 11"  (1.803 m)   Wt 160 lb 3.2 oz (72.7 kg)   SpO2 96%   BMI 22.34 kg/m   Physical Exam:  Chronically ill appearing NAD HEENT:  Unremarkable Neck:  6 cm JVD, no thyromegally Lymphatics:  No adenopathy Back:  No CVA tenderness Lungs:  Clear with no wheezes HEART:  Regular rate rhythm, no murmurs,  no rubs, no clicks Abd:  soft, positive bowel sounds, no organomegally, no rebound, no guarding Ext:  2 plus pulses, no edema, no cyanosis, no clubbing Skin:  No rashes no nodules Neuro:  CN II through XII intact, motor grossly intact  EKG - NSR with right superior axis and PVC's  Assess/Plan: PVC's - he is not symptomatic in that he does not have palpitations. I wonder if his bradycardia is related to the PVC's. 2. Heart block - he mostly has AVWB though he did have some 2:1 AV block.I wonder if this has progressed. 3. Parkinson's - this complicates things as we know that he is at risk for passing out from vasodepressor problems. He has not had syncope. 4. Hypertension - he has a predilection for low bp now.    Sharlot Gowda Cally Nygard,MD

## 2022-06-08 NOTE — Progress Notes (Unsigned)
Enrolled patient for a 14 day Zio XT  monitor to be mailed to patients home  °

## 2022-06-08 NOTE — Patient Instructions (Addendum)
Medication Instructions:  Your physician recommends that you continue on your current medications as directed. Please refer to the Current Medication list given to you today.  *If you need a refill on your cardiac medications before your next appointment, please call your pharmacy*  Lab Work: None ordered.  If you have labs (blood work) drawn today and your tests are completely normal, you will receive your results only by: MyChart Message (if you have MyChart) OR A paper copy in the mail If you have any lab test that is abnormal or we need to change your treatment, we will call you to review the results.  Testing/Procedures: None ordered.  Follow-Up: Dr. Lewayne Bunting has ordered a 14 day Zio Heart Monitor; And a 2D Echocardiogram to be scheduled for palpitations, and shortness of breath.   You will be called to schedule your Echocardiogram.     Your next appointment:   Please schedule a 6 to 8 week follow up appointment with Dr. Lewayne Bunting   The format for your next appointment:   In Person  Provider:   Lewayne Bunting, MD{or one of the following Advanced Practice Providers on your designated Care Team:   Francis Dowse, [place] Casimiro Needle "Mardelle Matte" Tillery, PA-C  Christena Deem- Long Term Monitor Instructions  Your physician has requested you wear a ZIO patch monitor for 14 days.  This is a single patch monitor. [employer] supplies one patch monitor per enrollment. Additional stickers are not available. Please do not apply patch if you will be having a Nuclear Stress Test,  Echocardiogram, Cardiac CT, MRI, or Chest Xray during the period you would be wearing the  monitor. The patch cannot be worn during these tests. You cannot remove and re-apply the  ZIO XT patch monitor.  Your ZIO patch monitor will be mailed 3 day USPS to your address on file. It may take 3-5 days  to receive your monitor after you have been enrolled.  Once you have received your monitor, please review the enclosed  instructions. Your monitor  has already been registered assigning a specific monitor serial # to you.  Billing and Patient Assistance Program Information  We have supplied [employer] with any of your insurance information on file for billing purposes. [employer] offers a sliding scale Patient Assistance Program for patients that do not have  insurance, or whose insurance does not completely cover the cost of the ZIO monitor.  You must apply for the Patient Assistance Program to qualify for this discounted rate.  To apply, please call [employer] at [phone], select option 4, select option 2, ask to apply for  Patient Assistance Program. Meredeth Ide will ask your household income, and how many people  are in your household. They will quote your out-of-pocket cost based on that information.  [employer] will also be able to set up a 26-month, interest-free payment plan if needed.  Applying the monitor   Shave hair from upper left chest.  Hold abrader disc by orange tab. Rub abrader in 40 strokes over the upper left chest as  indicated in your monitor instructions.  Clean area with 4 enclosed alcohol pads. Let dry.  Apply patch as indicated in monitor instructions. Patch will be placed under collarbone on left  side of chest with arrow pointing upward.  Rub patch adhesive wings for 2 minutes. Remove white label marked "1". Remove the white  label marked "2". Rub patch adhesive wings for 2 additional minutes.  While looking in a mirror, press and release button in  center of patch. A small green light will  flash 3-4 times. This will be your only indicator that the monitor has been turned on.  Do not shower for the first 24 hours. You may shower after the first 24 hours.  Press the button if you feel a symptom. You will hear a small click. Record Date, Time and  Symptom in the Patient Logbook.  When you are ready to remove the patch, follow instructions on the last 2 pages of Patient  Logbook. Stick patch  monitor onto the last page of Patient Logbook.  Place Patient Logbook in the blue and white box. Use locking tab on box and tape box closed  securely. The blue and white box has prepaid postage on it. Please place it in the mailbox as  soon as possible. Your physician should have your test results approximately 7 days after the  monitor has been mailed back to [facility].  Call [facility] Customer Care at [phone] if you have questions regarding  your ZIO XT patch monitor. Call them immediately if you see an orange light blinking on your  monitor.  If your monitor falls off in less than 4 days, contact our Monitor department at [phone].  If your monitor becomes loose or falls off after 4 days call [employer] at [phone] for  suggestions on securing your monitor

## 2022-06-10 ENCOUNTER — Encounter: Payer: Self-pay | Admitting: Neurology

## 2022-06-11 DIAGNOSIS — R002 Palpitations: Secondary | ICD-10-CM | POA: Diagnosis not present

## 2022-06-20 ENCOUNTER — Ambulatory Visit: Payer: Medicare Other | Admitting: Internal Medicine

## 2022-06-21 DIAGNOSIS — M25612 Stiffness of left shoulder, not elsewhere classified: Secondary | ICD-10-CM | POA: Diagnosis not present

## 2022-06-21 DIAGNOSIS — Z96612 Presence of left artificial shoulder joint: Secondary | ICD-10-CM | POA: Diagnosis not present

## 2022-06-21 DIAGNOSIS — Z471 Aftercare following joint replacement surgery: Secondary | ICD-10-CM | POA: Diagnosis not present

## 2022-06-29 NOTE — Telephone Encounter (Signed)
Patient has been scheduled

## 2022-07-04 ENCOUNTER — Telehealth: Payer: Self-pay | Admitting: Internal Medicine

## 2022-07-04 DIAGNOSIS — R002 Palpitations: Secondary | ICD-10-CM | POA: Diagnosis not present

## 2022-07-04 NOTE — Telephone Encounter (Signed)
Noted. I will review when complete. If syncope go to the ED, otherwise  I will see back in followup.

## 2022-07-04 NOTE — Telephone Encounter (Signed)
Abnormal results. Please advise 

## 2022-07-04 NOTE — Telephone Encounter (Signed)
Zio monitor tech, Zollie Scale, calling about end of service findings. Patient had complete heart block 32 bpm for 6.3 seconds. This can be found on page 17 and strip #4 in report. Will forward to Dr. Ladona Ridgel.

## 2022-07-05 ENCOUNTER — Ambulatory Visit (HOSPITAL_COMMUNITY): Payer: Medicare Other | Attending: Internal Medicine

## 2022-07-05 DIAGNOSIS — R0602 Shortness of breath: Secondary | ICD-10-CM | POA: Insufficient documentation

## 2022-07-05 LAB — ECHOCARDIOGRAM COMPLETE
Area-P 1/2: 2.69 cm2
S' Lateral: 3.3 cm

## 2022-08-01 ENCOUNTER — Encounter: Payer: Self-pay | Admitting: Internal Medicine

## 2022-08-01 ENCOUNTER — Telehealth: Payer: Self-pay | Admitting: Internal Medicine

## 2022-08-01 ENCOUNTER — Ambulatory Visit: Payer: Medicare Other | Attending: Internal Medicine | Admitting: Internal Medicine

## 2022-08-01 VITALS — BP 162/86 | HR 63 | Ht 72.0 in | Wt 160.4 lb

## 2022-08-01 DIAGNOSIS — I459 Conduction disorder, unspecified: Secondary | ICD-10-CM | POA: Diagnosis not present

## 2022-08-01 DIAGNOSIS — I493 Ventricular premature depolarization: Secondary | ICD-10-CM

## 2022-08-01 NOTE — Telephone Encounter (Signed)
Patient would like to schedule PPM implant.

## 2022-08-01 NOTE — Progress Notes (Signed)
HPI  [name] returns today for ongoing evaluation of bradycardia. He is a pleasant 77 yo man with Parkinsons who I last saw 6 months ago with a h/o HTN, and remote ASD repair. He also has PVC's. He wore a cardiac monitor demonstrating AVWB and periods of 2:1 AV block, mostly at night. He also has frequent PVC's including bigeminy.  He did have a pause with high grade heart block at 10:30. He has not had syncope. He has worsening fatigue and sob. He has had lability of his bp and his pulse rate by his bp cuff shows bradycardia. No edema. No frank syncope.     Current Outpatient Medications  Medication Sig Dispense Refill   alclomethasone (ACLOVATE) 0.05 % cream Apply 1 application  topically daily as needed (psoriasis).     ALLEGRA ALLERGY 180 MG tablet Take 180 mg by mouth daily as needed for allergies.     amLODipine (NORVASC) 5 MG tablet TAKE 1 & 1/2 TABLETS by mouth ONCE DAILY 135 tablet 3   bifidobacterium infantis (ALIGN) capsule Take 1 capsule by mouth daily.     carbidopa-levodopa (SINEMET IR) 25-100 MG tablet Take 1.5 tablets by mouth 3 (three) times daily. 405 tablet 1   hydrocortisone 2.5 % cream Apply 1 Application topically 2 (two) times daily as needed (psoriasis).     Multiple Vitamins-Minerals (CENTRUM SILVER 50+MEN) TABS Take 1 tablet by mouth daily.     neomycin-polymyxin-hydrocortisone (CORTISPORIN) OTIC solution Place 4 drops into the right ear 4 (four) times daily. Use for 5-7 days 10 mL 0   polyethylene glycol (MIRALAX / GLYCOLAX) 17 g packet Take 17 g by mouth daily.     triamcinolone (KENALOG) 0.1 % Apply 1 Application topically daily as needed (psoriasis).     No current facility-administered medications for this visit.     Past Medical History:  Diagnosis Date   Arthritis    LEFT shoulder   Bowel habit changes 06/2016   on Ketogenic diet/ does not go to the BR but every 3 days   BPH (benign prostatic hyperplasia)    Cataract 2015   bilateral sx    Complication of anesthesia    slow to wake   Diverticulosis of colon 2008   External hemorrhoids    GERD (gastroesophageal reflux disease)    with certain foods   Gilbert's syndrome    Heart murmur    History of anesthesia complications    Per pt, gets very light-headed past sedation/ fainted 1 time!   Hyperlipidemia    on meds   Hypertension    on meds   Parkinson disease    Pneumonia    Pre-diabetes    Skin cancer    X2 in [place]; now seeing Dr Harrie Jeans    ROS:   All systems reviewed and negative except as noted in the HPI.   Past Surgical History:  Procedure Laterality Date   ASD REPAIR  1961   [place] , [place]- open heart surgery   CATARACT EXTRACTION Bilateral 07/2013   bilateral eyes; [place] Lillia Corporal, [facility]   COLONOSCOPY  2008   [facility]   COLONOSCOPY  2017   JMP-MAC-2 day suprep(good)-polyps   INGUINAL HERNIA REPAIR Left 2006   LUMBAR LAMINECTOMY  1992   [place] , [place]   REVERSE SHOULDER ARTHROPLASTY Left 12/29/2021   Procedure: REVERSE SHOULDER ARTHROPLASTY;  Surgeon: Jones Broom, MD;  Location: [facility];  Service: Orthopedics;  Laterality: Left;   TONSILLECTOMY  as child   WISDOM TOOTH EXTRACTION       Family History  Problem Relation Age of Onset   Hypertension Mother    Other Mother        intestine blockage-burst   Emphysema Father        smoker   Cancer Neg Hx    Diabetes Neg Hx    Heart disease Neg Hx    Stroke Neg Hx    Colon cancer Neg Hx    Colon polyps Neg Hx    Stomach cancer Neg Hx    Rectal cancer Neg Hx      Social History   Socioeconomic History   Marital status: Married    Spouse name: Douglas Johnston   Number of children: 0   Years of education: BA   Highest education level: Bachelor's degree (e.g., BA, AB, BS)  Occupational History   Occupation: Retired  Tobacco Use   Smoking status: Never   Smokeless tobacco: Never  Vaping Use   Vaping status: Never Used  Substance and Sexual Activity   Alcohol  use: Yes    Comment: Rare   Drug use: No   Sexual activity: Yes  Other Topics Concern   Not on file  Social History Narrative      Caffeine use: coffee daily   Right handed    Social Determinants of Health   Financial Resource Strain: Low Risk  (04/02/2022)   Overall Financial Resource Strain (CARDIA)    Difficulty of Paying Living Expenses: Not hard at all  Food Insecurity: No Food Insecurity (04/02/2022)   Hunger Vital Sign    Worried About Running Out of Food in the Last Year: Never true    Ran Out of Food in the Last Year: Never true  Transportation Needs: No Transportation Needs (04/02/2022)   PRAPARE - Administrator, Civil Service (Medical): No    Lack of Transportation (Non-Medical): No  Physical Activity: Sufficiently Active (04/02/2022)   Exercise Vital Sign    Days of Exercise per Week: 4 days    Minutes of Exercise per Session: 40 min  Stress: No Stress Concern Present (04/02/2022)   [employer] of Occupational Health - Occupational Stress Questionnaire    Feeling of Stress : Only a little  Social Connections: Unknown (04/02/2022)   Social Connection and Isolation Panel [NHANES]    Frequency of Communication with Friends and Family: Once a week    Frequency of Social Gatherings with Friends and Family: Once a week    Attends Religious Services: Patient declined    Database administrator or Organizations: No    Attends Engineer, structural: More than 4 times per year    Marital Status: Married  Catering manager Violence: Not At Risk (11/01/2021)   Humiliation, Afraid, Rape, and Kick questionnaire    Fear of Current or Ex-Partner: No    Emotionally Abused: No    Physically Abused: No    Sexually Abused: No     BP (!) 162/86   Pulse 63   Ht 6' (1.829 m)   Wt 160 lb 6.4 oz (72.8 kg)   SpO2 96%   BMI 21.75 kg/m   Physical Exam:  Well appearing NAD HEENT: Unremarkable Neck:  No JVD, no thyromegally Lymphatics:  No  adenopathy Back:  No CVA tenderness Lungs:  Clear with no wheezes HEART:  Regular rate rhythm, no murmurs, no rubs, no clicks Abd:  soft, positive bowel sounds, no organomegally, no  rebound, no guarding Ext:  2 plus pulses, no edema, no cyanosis, no clubbing Skin:  No rashes no nodules Neuro:  CN II through XII intact, motor grossly intact  Assess/Plan: 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 It already seems to be. I have offered him PPM but been careful to try to limit his expectations. He will call us if he wishes to proceed. Parkinson's - he has a modest tremor. I discussed the possibililty of additional medical therapy for any vasodepression.  Sharlot Gowda Kaydon Creedon,MD

## 2022-08-01 NOTE — Patient Instructions (Addendum)
Medication Instructions:  Your physician recommends that you continue on your current medications as directed. Please refer to the Current Medication list given to you today.  *If you need a refill on your cardiac medications before your next appointment, please call your pharmacy*  Lab Work: None ordered.  If you have labs (blood work) drawn today and your tests are completely normal, you will receive your results only by: MyChart Message (if you have MyChart) OR A paper copy in the mail If you have any lab test that is abnormal or we need to change your treatment, we will call you to review the results.  Testing/Procedures: None ordered.  Follow-Up:  You will follow up with Dr. [name] in 4 months.   The following dates are available for pacemaker implant:  July 29 August 9, 16, 27, 30 September 4, 11, 16  Pacemaker Implantation, Adult Pacemaker implantation is a procedure to put a pacemaker in the chest. A pacemaker is a small computer that sends electrical signals to the heart. This helps the heart beat normally. Some pacemakers keep information about heart rhythms. You may need this procedure if you have: A slow heartbeat (bradycardia). Repeated fainting (syncope), or you often have dizziness or light-headedness because of an irregular heart rate. A weak heart that is not pumping enough blood to your body. The pacemaker can help your heart chambers beat in sync. The pacemaker attaches to your heart with a wire called a lead. One or two leads may be needed. There are different types of pacemakers: Transvenous pacemaker. This type is placed under the skin or muscle of your upper chest. The lead goes through a vein in the chest to the inside of the heart. Epicardial pacemaker. This type is placed under the skin or muscle of your chest or belly. The lead goes through your chest to the outside of the heart. Tell a health care provider about: Any allergies you have. All medicines  you are taking, including vitamins, herbs, eye drops, creams, and over-the-counter medicines. Any problems you or family members have had with anesthesia. Any bleeding problems or bone disorders you have. Any surgeries you have had. Any medical conditions you have. Whether you are pregnant or may be pregnant. What are the risks? Your health care provider will talk with you about risks. These may include: Infection. Bleeding. Failure of the pacemaker or lead. A collapsed lung or bleeding into a lung. A blood clot inside a blood vessel with a lead. Heart damage. Infection inside the heart (endocarditis). Allergic reactions to medicines. What happens before the procedure? When to stop eating and drinking Follow instructions from your provider about what you may eat and drink. These may include: 8 hours before your procedure Stop eating most foods. Do not eat meat, fried foods, or fatty foods. Eat only light foods, such as toast or crackers. All liquids are okay except energy drinks and alcohol. 6 hours before your procedure Stop eating. Drink only clear liquids, such as water, clear fruit juice, black coffee, plain tea, and sports drinks. Do not drink energy drinks or alcohol. 2 hours before your procedure Stop drinking all liquids. You may be allowed to take medicines with small sips of water. If you do not follow your provider's instructions, your procedure may be delayed or canceled. Medicines Ask your provider about: Changing or stopping your regular medicines. These include any diabetes medicines or blood thinners you take. Taking medicines such as aspirin and ibuprofen. These medicines can thin your blood.  Do not take them unless your provider tells you to. Taking over-the-counter medicines, vitamins, herbs, and supplements. Tests You may have: A heart test. This may include: An electrocardiogram (ECG). Patches will be put on your skin to check your heart rhythm. A chest  X-ray. An echocardiogram. Sound waves (ultrasound) are used to get an image of the heart. A cardiac rhythm monitor. This records your heart rhythm and any events for a longer period of time. Blood tests. Genetic testing. General instructions Do not use any products that contain nicotine or tobacco. These products include cigarettes, chewing tobacco, and vaping devices, such as e-cigarettes. If you need help quitting, ask your provider. Ask your provider: How your surgery site will be marked. What steps will be taken to help prevent infection. These steps may include: Removing hair at the surgery site. Washing skin with a soap that kills germs. Receiving antibiotics. If you will be going home right after the procedure, plan to have a responsible adult: Take you home from the hospital or clinic. You will not be allowed to drive. Care for you for the time you are told. What happens during the procedure? An IV will be inserted into one of your veins. You may be given: A sedative. This helps you relax. Anesthesia. This keeps you from feeling pain. It will make you fall asleep for surgery. The next steps vary depending on which pacemaker you will be getting. If you are getting a transvenous pacemaker: An incision will be made in your upper chest. A pocket will be made for the pacemaker. It may be placed under the skin or between layers of muscle. The lead will be put into a blood vessel that goes to the heart. While X-rays are taken by an imaging machine (fluoroscopy), the lead will be moved through the vein to the inside of your heart. The other end of the lead will be put under the skin and attached to the pacemaker. If you are getting an epicardial pacemaker: An incision will be made near your ribs or breastbone (sternum) for the lead. The lead will be attached to the outside of your heart. Another incision will be made in your chest or upper belly to make a pocket for the pacemaker. The  free end of the lead will be moved under the skin and attached to the pacemaker. The pacemaker will be tested. Pictures may be taken to check the lead position. The incisions will be closed with stitches (sutures), tape strips, or skin glue. Bandages (dressings) will be placed over the incisions. The procedure may vary among providers and hospitals. What happens after the procedure? Your blood pressure, heart rate, breathing rate, and blood oxygen level will be monitored until you leave the hospital or clinic. You may be given antibiotics. You will be given pain medicine. An ECG and chest X-rays will be done. Your provider will program the pacemaker. If you were given a sedative during the procedure, it can affect you for several hours. Do not drive or operate machinery until your provider says that it is safe. You will be given a pacemaker identification card. This card lists the implant date, device model, and manufacturer of your pacemaker. This information is not intended to replace advice given to you by your health care provider. Make sure you discuss any questions you have with your health care provider. Document Revised: 11/04/2021 Document Reviewed: 11/04/2021 Elsevier Patient Education  2024 [employer].

## 2022-08-03 NOTE — Telephone Encounter (Signed)
Pt is scheduled for a PPM Implant with Dr. Ladona Ridgel on 8/27.Douglas Johnston  He will have labs drawn on 7/31 and pick up Instruction letter and scrub that day.

## 2022-08-09 ENCOUNTER — Ambulatory Visit: Payer: Medicare Other | Attending: Internal Medicine

## 2022-08-09 DIAGNOSIS — I459 Conduction disorder, unspecified: Secondary | ICD-10-CM

## 2022-09-04 NOTE — Pre-Procedure Instructions (Signed)
Instructed patient on the following items: Arrival time 0515 Nothing to eat or drink after midnight No meds AM of procedure Responsible person to drive you home and stay with you for 24 hrs Wash with special soap night before and morning of procedure  

## 2022-09-05 ENCOUNTER — Ambulatory Visit (HOSPITAL_COMMUNITY)
Admission: RE | Admit: 2022-09-05 | Discharge: 2022-09-05 | Disposition: A | Discharge status: Home / Self Care | Payer: Medicare Other | Attending: Internal Medicine | Admitting: Internal Medicine

## 2022-09-05 ENCOUNTER — Other Ambulatory Visit: Payer: Self-pay

## 2022-09-05 ENCOUNTER — Ambulatory Visit (HOSPITAL_COMMUNITY): Payer: Medicare Other

## 2022-09-05 ENCOUNTER — Encounter (HOSPITAL_COMMUNITY)
Admission: RE | Disposition: A | Discharge status: Home / Self Care | Payer: Self-pay | Source: Home / Self Care | Attending: Internal Medicine

## 2022-09-05 DIAGNOSIS — Z8249 Family history of ischemic heart disease and other diseases of the circulatory system: Secondary | ICD-10-CM | POA: Insufficient documentation

## 2022-09-05 DIAGNOSIS — Z950 Presence of cardiac pacemaker: Secondary | ICD-10-CM | POA: Diagnosis not present

## 2022-09-05 DIAGNOSIS — R001 Bradycardia, unspecified: Secondary | ICD-10-CM | POA: Diagnosis not present

## 2022-09-05 DIAGNOSIS — I517 Cardiomegaly: Secondary | ICD-10-CM | POA: Diagnosis not present

## 2022-09-05 DIAGNOSIS — I10 Essential (primary) hypertension: Secondary | ICD-10-CM | POA: Diagnosis not present

## 2022-09-05 DIAGNOSIS — I441 Atrioventricular block, second degree: Secondary | ICD-10-CM | POA: Insufficient documentation

## 2022-09-05 DIAGNOSIS — G20A1 Parkinson's disease without dyskinesia, without mention of fluctuations: Secondary | ICD-10-CM | POA: Diagnosis not present

## 2022-09-05 HISTORY — PX: PACEMAKER IMPLANT: EP1218

## 2022-09-05 SURGERY — PACEMAKER IMPLANT

## 2022-09-05 MED ORDER — FENTANYL CITRATE (PF) 100 MCG/2ML IJ SOLN
INTRAMUSCULAR | Status: AC
  Filled 2022-09-05: qty 2

## 2022-09-05 MED ORDER — ONDANSETRON HCL 4 MG/2ML IJ SOLN: 4.0000 mg | Freq: Four times a day (QID) | INTRAMUSCULAR | Status: DC | PRN

## 2022-09-05 MED ORDER — CEFAZOLIN SODIUM-DEXTROSE 2-4 GM/100ML-% IV SOLN: 2.0000 g | Freq: Once | INTRAVENOUS | Status: DC

## 2022-09-05 MED ORDER — LIDOCAINE HCL 1 % IJ SOLN
INTRAMUSCULAR | Status: AC
  Filled 2022-09-05: qty 60

## 2022-09-05 MED ORDER — CHLORHEXIDINE GLUCONATE 4 % EX SOLN: 4.0000 | Freq: Once | CUTANEOUS | Status: DC

## 2022-09-05 MED ORDER — POVIDONE-IODINE 10 % EX SWAB
2.0000 | Freq: Once | CUTANEOUS | Status: AC
  Administered 2022-09-05: 2 via TOPICAL

## 2022-09-05 MED ORDER — CEFAZOLIN SODIUM-DEXTROSE 2-4 GM/100ML-% IV SOLN
INTRAVENOUS | Status: AC
  Filled 2022-09-05: qty 100

## 2022-09-05 MED ORDER — LIDOCAINE HCL (PF) 1 % IJ SOLN
INTRAMUSCULAR | Status: DC | PRN
  Administered 2022-09-05: 60 mL

## 2022-09-05 MED ORDER — MIDAZOLAM HCL 5 MG/5ML IJ SOLN
INTRAMUSCULAR | Status: DC | PRN
  Administered 2022-09-05: 1 mg via INTRAVENOUS

## 2022-09-05 MED ORDER — SODIUM CHLORIDE 0.9 % IV SOLN: INTRAVENOUS | Status: DC

## 2022-09-05 MED ORDER — HEPARIN (PORCINE) IN NACL 1000-0.9 UT/500ML-% IV SOLN
INTRAVENOUS | Status: DC | PRN
  Administered 2022-09-05: 500 mL

## 2022-09-05 MED ORDER — CEFAZOLIN SODIUM-DEXTROSE 2-4 GM/100ML-% IV SOLN
2.0000 g | INTRAVENOUS | Status: AC
  Administered 2022-09-05: 2 g via INTRAVENOUS

## 2022-09-05 MED ORDER — IOHEXOL 350 MG/ML SOLN
INTRAVENOUS | Status: DC | PRN
  Administered 2022-09-05: 10 mL

## 2022-09-05 MED ORDER — SODIUM CHLORIDE 0.9 % IV SOLN
80.0000 mg | INTRAVENOUS | Status: AC
  Administered 2022-09-05: 80 mg

## 2022-09-05 MED ORDER — FENTANYL CITRATE (PF) 100 MCG/2ML IJ SOLN
INTRAMUSCULAR | Status: DC | PRN
  Administered 2022-09-05: 12.5 ug via INTRAVENOUS

## 2022-09-05 MED ORDER — SODIUM CHLORIDE 0.9 % IV SOLN
INTRAVENOUS | Status: AC
  Filled 2022-09-05: qty 2

## 2022-09-05 MED ORDER — ACETAMINOPHEN 325 MG PO TABS: 325.0000 mg | ORAL_TABLET | ORAL | Status: DC | PRN

## 2022-09-05 MED ORDER — MIDAZOLAM HCL 5 MG/5ML IJ SOLN
INTRAMUSCULAR | Status: AC
  Filled 2022-09-05: qty 5

## 2022-09-05 SURGICAL SUPPLY — 12 items
CABLE SURGICAL S-101-97-12 (CABLE) ×2 IMPLANT
CATH RIGHTSITE C315HIS02 (CATHETERS) IMPLANT
IPG PACE AZUR XT DR MRI W1DR01 (Pacemaker) IMPLANT
LEAD CAPSURE NOVUS 5076-52CM (Lead) IMPLANT
LEAD SELECT SECURE 3830 383069 (Lead) IMPLANT
PACE AZURE XT DR MRI W1DR01 (Pacemaker) ×1 IMPLANT
PAD DEFIB RADIO PHYSIO CONN (PAD) ×2 IMPLANT
SELECT SECURE 3830 383069 (Lead) ×1 IMPLANT
SHEATH 7FR PRELUDE SNAP 13 (SHEATH) IMPLANT
SLITTER 6232ADJ (MISCELLANEOUS) IMPLANT
TRAY PACEMAKER INSERTION (PACKS) ×2 IMPLANT
WIRE HI TORQ VERSACORE-J 145CM (WIRE) IMPLANT

## 2022-09-05 NOTE — H&P (Signed)
 HPI  Mr. Fountain returns today for ongoing evaluation of bradycardia. He is a pleasant 77 yo man with Parkinsons who I last saw 6 months ago with a h/o HTN, and remote ASD repair. He also has PVC's. He wore a cardiac monitor demonstrating AVWB and periods of 2:1 AV block, mostly at night. He also has frequent PVC's including bigeminy.  He did have a pause with high grade heart block at 10:30. He has not had syncope. He has worsening fatigue and sob. He has had lability of his bp and his pulse rate by his bp cuff shows bradycardia. No edema. No frank syncope.     Current Outpatient Medications  Medication Sig Dispense Refill   alclomethasone (ACLOVATE) 0.05 % cream Apply 1 application  topically daily as needed (psoriasis).     ALLEGRA ALLERGY 180 MG tablet Take 180 mg by mouth daily as needed for allergies.     amLODipine (NORVASC) 5 MG tablet TAKE 1 & 1/2 TABLETS by mouth ONCE DAILY 135 tablet 3   bifidobacterium infantis (ALIGN) capsule Take 1 capsule by mouth daily.     carbidopa-levodopa (SINEMET IR) 25-100 MG tablet Take 1.5 tablets by mouth 3 (three) times daily. 405 tablet 1   hydrocortisone 2.5 % cream Apply 1 Application topically 2 (two) times daily as needed (psoriasis).     Multiple Vitamins-Minerals (CENTRUM SILVER 50+MEN) TABS Take 1 tablet by mouth daily.     neomycin-polymyxin-hydrocortisone (CORTISPORIN) OTIC solution Place 4 drops into the right ear 4 (four) times daily. Use for 5-7 days 10 mL 0   polyethylene glycol (MIRALAX / GLYCOLAX) 17 g packet Take 17 g by mouth daily.     triamcinolone (KENALOG) 0.1 % Apply 1 Application topically daily as needed (psoriasis).     No current facility-administered medications for this visit.     Past Medical History:  Diagnosis Date   Arthritis    LEFT shoulder   Bowel habit changes 06/2016   on Ketogenic diet/ does not go to the BR but every 3 days   BPH (benign prostatic hyperplasia)    Cataract 2015   bilateral sx    Complication of anesthesia    slow to wake   Diverticulosis of colon 2008   External hemorrhoids    GERD (gastroesophageal reflux disease)    with certain foods   Gilbert's syndrome    Heart murmur    History of anesthesia complications    Per pt, gets very light-headed past sedation/ fainted 1 time!   Hyperlipidemia    on meds   Hypertension    on meds   Parkinson disease    Pneumonia    Pre-diabetes    Skin cancer    X2 in [place]; now seeing Dr Harrie Jeans    ROS:   All systems reviewed and negative except as noted in the HPI.   Past Surgical History:  Procedure Laterality Date   ASD REPAIR  1961   [place] , [place]- open heart surgery   CATARACT EXTRACTION Bilateral 07/2013   bilateral eyes; Dr Lillia Corporal, [facility]   COLONOSCOPY  2008   [facility]   COLONOSCOPY  2017   [facility]MP-MAC-2 day suprep(good)-polyps   INGUINAL HERNIA REPAIR Left 2006   LUMBAR LAMINECTOMY  1992   [place] , [place]   REVERSE SHOULDER ARTHROPLASTY Left 12/29/2021   Procedure: REVERSE SHOULDER ARTHROPLASTY;  Surgeon: Jones Broom, MD;  Location: [facility];  Service: Orthopedics;  Laterality: Left;   TONSILLECTOMY  as child   WISDOM TOOTH EXTRACTION       Family History  Problem Relation Age of Onset   Hypertension Mother    Other Mother        intestine blockage-burst   Emphysema Father        smoker   Cancer Neg Hx    Diabetes Neg Hx    Heart disease Neg Hx    Stroke Neg Hx    Colon cancer Neg Hx    Colon polyps Neg Hx    Stomach cancer Neg Hx    Rectal cancer Neg Hx      Social History   Socioeconomic History   Marital status: Married    Spouse name: Dasan Hardman   Number of children: 0   Years of education: BA   Highest education level: Bachelor's degree (e.g., BA, AB, BS)  Occupational History   Occupation: Retired  Tobacco Use   Smoking status: Never   Smokeless tobacco: Never  Vaping Use   Vaping status: Never Used  Substance and Sexual Activity   Alcohol  use: Yes    Comment: Rare   Drug use: No   Sexual activity: Yes  Other Topics Concern   Not on file  Social History Narrative      Caffeine use: coffee daily   Right handed    Social Determinants of Health   Financial Resource Strain: Low Risk  (04/02/2022)   Overall Financial Resource Strain (CARDIA)    Difficulty of Paying Living Expenses: Not hard at all  Food Insecurity: No Food Insecurity (04/02/2022)   Hunger Vital Sign    Worried About Running Out of Food in the Last Year: Never true    Ran Out of Food in the Last Year: Never true  Transportation Needs: No Transportation Needs (04/02/2022)   PRAPARE - Administrator, Civil Service (Medical): No    Lack of Transportation (Non-Medical): No  Physical Activity: Sufficiently Active (04/02/2022)   Exercise Vital Sign    Days of Exercise per Week: 4 days    Minutes of Exercise per Session: 40 min  Stress: No Stress Concern Present (04/02/2022)   [employer] of Occupational Health - Occupational Stress Questionnaire    Feeling of Stress : Only a little  Social Connections: Unknown (04/02/2022)   Social Connection and Isolation Panel [NHANES]    Frequency of Communication with Friends and Family: Once a week    Frequency of Social Gatherings with Friends and Family: Once a week    Attends Religious Services: Patient declined    Database administrator or Organizations: No    Attends Engineer, structural: More than 4 times per year    Marital Status: Married  Catering manager Violence: Not At Risk (11/01/2021)   Humiliation, Afraid, Rape, and Kick questionnaire    Fear of Current or Ex-Partner: No    Emotionally Abused: No    Physically Abused: No    Sexually Abused: No     BP (!) 162/86   Pulse 63   Ht 6' (1.829 m)   Wt 160 lb 6.4 oz (72.8 kg)   SpO2 96%   BMI 21.75 kg/m   Physical Exam:  Well appearing NAD HEENT: Unremarkable Neck:  No JVD, no thyromegally Lymphatics:  No  adenopathy Back:  No CVA tenderness Lungs:  Clear with no wheezes HEART:  Regular rate rhythm, no murmurs, no rubs, no clicks Abd:  soft, positive bowel sounds, no organomegally, no  rebound, no guarding Ext:  2 plus pulses, no edema, no cyanosis, no clubbing Skin:  No rashes no nodules Neuro:  CN II through XII intact, motor grossly intact  Assess/Plan: 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 It already seems to be. I have offered him PPM but been careful to try to limit his expectations. He will call us if he wishes to proceed. Parkinson's - he has a modest tremor. I discussed the possibililty of additional medical therapy for any vasodepression.  Sharlot Gowda Taylor,MD

## 2022-09-05 NOTE — Discharge Instructions (Signed)
After Your Pacemaker   You have a [employer]onic Pacemaker  ACTIVITY Do not lift your arm above shoulder height for 1 week after your procedure. After 7 days, you may progress as below.  You should remove your sling 24 hours after your procedure, unless otherwise instructed by your provider.     Tuesday September 12, 2022  Wednesday September 13, 2022 Thursday September 14, 2022 Friday September 15, 2022   Do not lift, push, pull, or carry anything over 10 pounds with the affected arm until 6 weeks (Tuesday October 17, 2022 ) after your procedure.   You may drive AFTER your wound check, unless you have been told otherwise by your provider.   Ask your healthcare provider when you can go back to work   INCISION/Dressing If you are on a blood thinner such as Coumadin, Xarelto, Eliquis, Plavix, or Pradaxa please confirm with your provider when this should be resumed.   If large square, outer bandage is left in place, this can be removed after 24 hours from your procedure. Do not remove steri-strips or glue as below.   If a PRESSURE DRESSING (a bulky dressing that usually goes up over your shoulder) was applied or left in place, please follow instructions given by your provider on when to return to have this removed.   Monitor your Pacemaker site for redness, swelling, and drainage. Call the device clinic at [phone] if you experience these symptoms or fever/chills.  If your incision is sealed with Steri-strips or staples, you may shower 7 days after your procedure or when told by your provider. Do not remove the steri-strips or let the shower hit directly on your site. You may wash around your site with soap and water.    If you were discharged in a sling, please do not wear this during the day more than 48 hours after your surgery unless otherwise instructed. This may increase the risk of stiffness and soreness in your shoulder.   Avoid lotions, ointments, or perfumes over your incision until  it is well-healed.  You may use a hot tub or a pool AFTER your wound check appointment if the incision is completely closed.  Pacemaker Alerts:  Some alerts are vibratory and others beep. These are NOT emergencies. Please call our office to let us know. If this occurs at night or on weekends, it can wait until the next business day. Send a remote transmission.  If your device is capable of reading fluid status (for heart failure), you will be offered monthly monitoring to review this with you.   DEVICE MANAGEMENT Remote monitoring is used to monitor your pacemaker from home. This monitoring is scheduled every 91 days by our office. It allows [place] to keep an eye on the functioning of your device to ensure it is working properly. You will routinely see your Electrophysiologist annually (more often if necessary).   You should receive your ID card for your new device in 4-8 weeks. Keep this card with you at all times once received. Consider wearing a medical alert bracelet or necklace.  Your Pacemaker may be MRI compatible. This will be discussed at your next office visit/wound check.  You should avoid contact with strong electric or magnetic fields.   Do not use amateur (ham) radio equipment or electric (arc) welding torches. MP3 player headphones with magnets should not be used. Some devices are safe to use if held at least 12 inches (30 cm) from your Pacemaker. These include power tools, lawn  mowers, and speakers. If you are unsure if something is safe to use, ask your health care provider.  When using your cell phone, hold it to the ear that is on the opposite side from the Pacemaker. Do not leave your cell phone in a pocket over the Pacemaker.  You may safely use electric blankets, heating pads, computers, and microwave ovens.  Call the office right away if: You have chest pain. You feel more short of breath than you have felt before. You feel more light-headed than you have felt before. Your  incision starts to open up.  This information is not intended to replace advice given to you by your health care provider. Make sure you discuss any questions you have with your health care provider.

## 2022-09-06 ENCOUNTER — Encounter (HOSPITAL_COMMUNITY): Payer: Self-pay | Admitting: Internal Medicine

## 2022-09-20 ENCOUNTER — Ambulatory Visit: Payer: Medicare Other | Attending: Internal Medicine

## 2022-09-20 DIAGNOSIS — I442 Atrioventricular block, complete: Secondary | ICD-10-CM | POA: Diagnosis not present

## 2022-09-20 LAB — CUP PACEART INCLINIC DEVICE CHECK
Battery Remaining Longevity: 139 mo
Battery Voltage: 3.21 V
Brady Statistic AP VP Percent: 33.69 %
Brady Statistic AP VS Percent: 0.01 %
Brady Statistic AS VP Percent: 64.79 %
Brady Statistic AS VS Percent: 1.51 %
Brady Statistic RA Percent Paced: 34.49 %
Brady Statistic RV Percent Paced: 98.48 %
Date Time Interrogation Session: 20240911101711
Implantable Lead Connection Status: 753985
Implantable Lead Connection Status: 753985
Implantable Lead Implant Date: 20240827
Implantable Lead Implant Date: 20240827
Implantable Lead Location: 753859
Implantable Lead Location: 753860
Implantable Lead Model: 3830
Implantable Lead Model: 5076
Implantable Pulse Generator Implant Date: 20240827
Lead Channel Impedance Value: 361 Ohm
Lead Channel Impedance Value: 399 Ohm
Lead Channel Impedance Value: 532 Ohm
Lead Channel Impedance Value: 570 Ohm
Lead Channel Pacing Threshold Amplitude: 0.625 V
Lead Channel Pacing Threshold Amplitude: 1 V
Lead Channel Pacing Threshold Pulse Width: 0.4 ms
Lead Channel Pacing Threshold Pulse Width: 0.4 ms
Lead Channel Sensing Intrinsic Amplitude: 13.5 mV
Lead Channel Sensing Intrinsic Amplitude: 14 mV
Lead Channel Sensing Intrinsic Amplitude: 2.875 mV
Lead Channel Sensing Intrinsic Amplitude: 3.875 mV
Lead Channel Setting Pacing Amplitude: 3.5 V
Lead Channel Setting Pacing Amplitude: 3.5 V
Lead Channel Setting Pacing Pulse Width: 0.4 ms
Lead Channel Setting Sensing Sensitivity: 1.2 mV
Zone Setting Status: 755011

## 2022-09-20 NOTE — Progress Notes (Signed)
Wound check appointment. Steri-strips removed. Wound without redness or edema. Incision edges approximated, wound well healed. Normal device function. Thresholds, sensing, and impedances consistent with implant measurements. Device programmed at 3.5V/auto capture programmed on for extra safety margin until 3 month visit. Histogram distribution appropriate for patient and level of activity. No mode switches or high ventricular rates noted. Patient educated about wound care, arm mobility, lifting restrictions. ROV 12/12/22 w/ GT.

## 2022-09-20 NOTE — Patient Instructions (Addendum)
   After Your Pacemaker   Monitor your pacemaker site for redness, swelling, and drainage. Call the device clinic at [phone] if you experience these symptoms or fever/chills.  Your incision was closed with Steri-strips or staples:  You may shower 7 days after your procedure and wash your incision with soap and water. Avoid lotions, ointments, or perfumes over your incision until it is well-healed.  You may use a hot tub or a pool after your wound check appointment if the incision is completely closed.  Do not lift, push or pull greater than 10 pounds with the affected arm October 17 2022 There are no other restrictions in arm movement after your wound check appointment.  You may drive, unless driving has been restricted by your healthcare providers.  Remote monitoring is used to monitor your pacemaker from home. This monitoring is scheduled every 91 days by our office. It allows [place] to keep an eye on the functioning of your device to ensure it is working properly. You will routinely see your Electrophysiologist annually (more often if necessary).

## 2022-09-22 ENCOUNTER — Encounter: Payer: Self-pay | Admitting: Internal Medicine

## 2022-09-27 NOTE — Progress Notes (Signed)
Assessment/Plan:   1.  Parkinsons Disease  -continue carbidopa/levodopa 25/100, 1.5 tablet 3 times per day.  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  They would like a referral to [facility].   2.  History of melanoma  -Patient follows with dermatology.  -Patient understands that Parkinson's also slightly increases risk for melanoma.  He follows with Douglas Johnston.   3.  REM behavior disorder  -This is commonly associated with PD and the patient is experiencing this.  We discussed that this can be very serious and even harmful.  We talked about medications as well as physical barriers to put in the bed (particularly soft bed rails, pillow barriers).  We talked about moving the night stand so that it is not so close to the side of the bed.  4.  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  Subjective:   Douglas Johnston. was seen today in follow up for Parkinsons disease.  My previous records were reviewed prior to todays visit as well as outside records available to me.   Pt with wife who supplements hx. I slightly increased his levodopa last visit.  Not long thereafter, he sent a message stating that his blood pressure was having significant fluctuations, but recorded in a number of fairly significant high blood pressures.  We told him that high blood pressure was certainly not from Parkinsons disease, but the lows and the fluctuation certainly could be.  He was having palpitations as well, so was told to follow-up with his cardiologist, Douglas Johnston.  He saw Douglas Johnston, who ordered a cardiac monitor.  This demonstrated frequent PVCs, including bigeminy.  He had a pause with high-grade block on 1 occasion.  Douglas Johnston felt that he likely had symptomatic  bradycardia and pacemaker was offered and ultimately completed on September 05, 2022.  Pt does overall feel better.  Pt was previously getting SOB going upstairs and he is not having that now.  He also did some exercise (but not back to full exercise yet) and did better than previous.    Was having some near syncope but nothing lately.    Current prescribed movement disorder medications: Carbidopa/levodopa 25/100, 1.5 tablet at 7 AM/11 AM/4 PM (increased)   PREVIOUS MEDICATIONS:  propranolol 40 mg 3 times per day (bradycardia); primidone 100 mg twice daily (with Douglas Johnston); levodopa  ALLERGIES:   Allergies  Allergen Reactions   Tessalon [Benzonatate] Shortness Of Breath and Cough    Inc cough, wheeze, SOB    CURRENT MEDICATIONS:  Outpatient Encounter Medications as of 09/29/2022  Medication Sig   alclomethasone (ACLOVATE) 0.05 % cream Apply 1 application  topically daily as needed (psoriasis).   ALLEGRA ALLERGY 180 MG tablet Take 180 mg by mouth daily as needed for allergies.   bifidobacterium infantis (ALIGN) capsule Take 1 capsule by mouth daily.   carbidopa-levodopa (SINEMET IR) 25-100 MG tablet Take 1.5 tablets by mouth 3 (three) times daily.   hydrocortisone 2.5 % cream Apply 1 Application topically 2 (two) times daily as needed (psoriasis).   Multiple Vitamins-Minerals (CENTRUM SILVER 50+MEN) TABS Take 1 tablet by mouth daily.   polyethylene glycol (MIRALAX / GLYCOLAX) 17 g packet Take 17 g by mouth daily.   triamcinolone (  KENALOG) 0.1 % Apply 1 Application topically daily as needed (psoriasis).   No facility-administered encounter medications on file as of 09/29/2022.    Objective:   PHYSICAL EXAMINATION:    VITALS:   Vitals:   09/29/22 0807  BP: 132/88  Pulse: 71  SpO2: 95%  Weight: 160 lb 9.6 oz (72.8 kg)  Height: 6' (1.829 m)      GEN:  The patient appears stated age and is in NAD. HEENT:  Normocephalic, atraumatic.  The mucous membranes are moist. The superficial  temporal arteries are without ropiness or tenderness. CV:  RRR Lungs:  CTAB Neck:  no bruits  Neurological examination:  Orientation: The patient is alert and oriented x3. Cranial nerves: There is good facial symmetry without facial hypomimia. The speech is fluent and clear. Soft palate rises symmetrically and there is no tongue deviation. Hearing is intact to conversational tone. Sensation: Sensation is intact to light touch throughout Motor: Strength is at least antigravity x4.  Movement examination: Tone: There is nl tone today Abnormal movements: there is mild LUE rest tremor Coordination:  There is no decremation, with any form of RAMS, including alternating supination and pronation of the forearm, hand opening and closing, finger taps, heel taps and toe taps.  Gait and Station: The patient has no difficulty arising out of a deep-seated chair without the use of the hands. The patient's stride length is good, decreased arm swing on the L with significant bend to the R (? 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)  I have reviewed and interpreted the following labs independently    Chemistry      Component Value Date/Time   NA 140 08/09/2022 1053   K 4.8 08/09/2022 1053   CL 102 08/09/2022 1053   CO2 24 08/09/2022 1053   BUN 20 08/09/2022 1053   CREATININE 0.88 08/09/2022 1053      Component Value Date/Time   CALCIUM 9.4 08/09/2022 1053   ALKPHOS 56 04/05/2022 1525   AST 20 04/05/2022 1525   ALT 9 04/05/2022 1525   BILITOT 0.8 04/05/2022 1525       Lab Results  Component Value Date   WBC 7.2 08/09/2022   HGB 13.7 08/09/2022   HCT 41.5 08/09/2022   MCV 95 08/09/2022   PLT 243 08/09/2022    Lab Results  Component Value Date   TSH 3.36 04/05/2022   Total time spent on today's visit was 34 minutes, including both face-to-face time and nonface-to-face time.  Time included that spent on review of records (prior notes available to me/labs/imaging if pertinent),  discussing treatment and goals, answering patient's questions and coordinating care.  Cc:  Douglas Sanes, MD

## 2022-09-29 ENCOUNTER — Other Ambulatory Visit: Payer: Self-pay

## 2022-09-29 ENCOUNTER — Encounter: Payer: Self-pay | Admitting: Neurology

## 2022-09-29 ENCOUNTER — Ambulatory Visit: Payer: Medicare Other | Admitting: Neurology

## 2022-09-29 VITALS — BP 132/88 | HR 71 | Ht 72.0 in | Wt 160.6 lb

## 2022-09-29 DIAGNOSIS — G20A1 Parkinson's disease without dyskinesia, without mention of fluctuations: Secondary | ICD-10-CM

## 2022-09-29 NOTE — Patient Instructions (Signed)
SAVE THE DATE!  We are planning a Parkinsons Disease educational symposium at [facility] in [place] on October 11.  To sign up, you can email [email or web address].  We hope to see you there!

## 2022-10-02 ENCOUNTER — Encounter: Payer: Self-pay | Admitting: Internal Medicine

## 2022-10-02 NOTE — Patient Instructions (Addendum)
       Medications changes include :   tussionex cough syrup, start mucinex to help thin the mucus.       Return if symptoms worsen or fail to improve.

## 2022-10-02 NOTE — Progress Notes (Unsigned)
Subjective:    Patient ID: Douglas Singh., male    DOB: 27-Jan-1945, 77 y.o.   MRN: [id number]      HPI Douglas Johnston is here for No chief complaint on file.    He is here for an acute visit for covid.  His symptoms started  He is experiencing   He has tried taking     Medications and allergies reviewed with patient and updated if appropriate.  Current Outpatient Medications on File Prior to Visit  Medication Sig Dispense Refill   alclomethasone (ACLOVATE) 0.05 % cream Apply 1 application  topically daily as needed (psoriasis).     ALLEGRA ALLERGY 180 MG tablet Take 180 mg by mouth daily as needed for allergies.     bifidobacterium infantis (ALIGN) capsule Take 1 capsule by mouth daily.     carbidopa-levodopa (SINEMET IR) 25-100 MG tablet Take 1.5 tablets by mouth 3 (three) times daily. 405 tablet 1   hydrocortisone 2.5 % cream Apply 1 Application topically 2 (two) times daily as needed (psoriasis).     Multiple Vitamins-Minerals (CENTRUM SILVER 50+MEN) TABS Take 1 tablet by mouth daily.     polyethylene glycol (MIRALAX / GLYCOLAX) 17 g packet Take 17 g by mouth daily.     triamcinolone (KENALOG) 0.1 % Apply 1 Application topically daily as needed (psoriasis).     No current facility-administered medications on file prior to visit.    Review of Systems     Objective:  There were no vitals filed for this visit. BP Readings from Last 3 Encounters:  09/29/22 132/88  09/05/22 (!) 149/91  08/01/22 (!) 162/86   Wt Readings from Last 3 Encounters:  09/29/22 160 lb 9.6 oz (72.8 kg)  09/05/22 160 lb (72.6 kg)  08/01/22 160 lb 6.4 oz (72.8 kg)   There is no height or weight on file to calculate BMI.    Physical Exam         Assessment & Plan:    See Problem List for Assessment and Plan of chronic medical problems.

## 2022-10-03 ENCOUNTER — Ambulatory Visit (INDEPENDENT_AMBULATORY_CARE_PROVIDER_SITE_OTHER): Payer: Medicare Other | Admitting: Internal Medicine

## 2022-10-03 VITALS — BP 124/86 | HR 94 | Temp 98.2°F | Ht 72.0 in | Wt 159.0 lb

## 2022-10-03 DIAGNOSIS — J069 Acute upper respiratory infection, unspecified: Secondary | ICD-10-CM | POA: Diagnosis not present

## 2022-10-03 MED ORDER — HYDROCOD POLI-CHLORPHE POLI ER 10-8 MG/5ML PO SUER: 5.0000 mL | Freq: Two times a day (BID) | ORAL | 0 refills | Status: DC | PRN

## 2022-10-03 NOTE — Assessment & Plan Note (Signed)
Acute Symptoms likely viral in nature Covid test at home negative x 2 Tussionex cough syrup Continue symptomatic treatment with over-the-counter cold medications, Tylenol/ibuprofen Increase rest and fluids Call if symptoms worsen or do not improve

## 2022-10-13 ENCOUNTER — Telehealth: Payer: Self-pay | Admitting: Neurology

## 2022-10-13 NOTE — Telephone Encounter (Signed)
Resent to [facility] have a success fax return

## 2022-10-13 NOTE — Telephone Encounter (Signed)
Patient said he would like a call back from [place] regarding a referral that was supposed to been sent to [facility]. They didn't get it and he would like to speak with someone.

## 2022-10-16 DIAGNOSIS — L821 Other seborrheic keratosis: Secondary | ICD-10-CM | POA: Diagnosis not present

## 2022-10-16 DIAGNOSIS — L570 Actinic keratosis: Secondary | ICD-10-CM | POA: Diagnosis not present

## 2022-10-16 DIAGNOSIS — Z85828 Personal history of other malignant neoplasm of skin: Secondary | ICD-10-CM | POA: Diagnosis not present

## 2022-10-16 DIAGNOSIS — D1801 Hemangioma of skin and subcutaneous tissue: Secondary | ICD-10-CM | POA: Diagnosis not present

## 2022-10-16 DIAGNOSIS — L812 Freckles: Secondary | ICD-10-CM | POA: Diagnosis not present

## 2022-10-16 DIAGNOSIS — L218 Other seborrheic dermatitis: Secondary | ICD-10-CM | POA: Diagnosis not present

## 2022-10-31 ENCOUNTER — Ambulatory Visit (INDEPENDENT_AMBULATORY_CARE_PROVIDER_SITE_OTHER): Payer: Medicare Other

## 2022-10-31 VITALS — Ht 72.0 in | Wt 159.0 lb

## 2022-10-31 DIAGNOSIS — Z0000 Encounter for general adult medical examination without abnormal findings: Secondary | ICD-10-CM

## 2022-10-31 NOTE — Progress Notes (Signed)
Subjective:   Douglas Johnston. is a 77 y.o. male who presents for Medicare Annual/Subsequent preventive examination.  Visit Complete: Virtual I connected with  Douglas Johnston. on 10/31/22 by a audio enabled telemedicine application and verified that I am speaking with the correct person using two identifiers.  Patient Location: Home  Provider Location: Home Office  I discussed the limitations of evaluation and management by telemedicine. The patient expressed understanding and agreed to proceed.  Vital Signs: Because this visit was a virtual/telehealth visit, some criteria may be missing or patient reported. Any vitals not documented were not able to be obtained and vitals that have been documented are patient reported.  Patient Medicare AWV questionnaire was completed by the patient on 10/30/2022; I have confirmed that all information answered by patient is correct and no changes since this date.  Cardiac Risk Factors include: advanced age (>39men, >96 women);male gender;dyslipidemia;Other (see comment), Risk factor comments: PVC, Heart block, Parkinson's disease     Objective:    Today's Vitals   10/31/22 0923  Weight: 159 lb (72.1 kg)  Height: 6' (1.829 m)   Body mass index is 21.56 kg/m.     10/31/2022    9:51 AM 09/29/2022    8:08 AM 09/05/2022    5:49 AM 12/23/2021    9:44 AM 11/15/2021    1:06 PM 11/01/2021   10:03 AM 05/12/2021   12:56 PM  Advanced Directives  Does Patient Have a Medical Advance Directive? Yes Yes Yes Yes Yes Yes Yes  Type of Estate agent of [place];Living will Living will Healthcare Power of [place];Living will Healthcare Power of [place];Living will Living will Healthcare Power of [place];Living will Living will  Copy of Healthcare Power of Attorney in Chart?    No - copy requested  No - copy requested     Current Medications (verified) Outpatient Encounter Medications as of 10/31/2022  Medication Sig    ALLEGRA ALLERGY 180 MG tablet Take 180 mg by mouth daily as needed for allergies.   bifidobacterium infantis (ALIGN) capsule Take 1 capsule by mouth daily.   carbidopa-levodopa (SINEMET IR) 25-100 MG tablet Take 1.5 tablets by mouth 3 (three) times daily.   hydrocortisone 2.5 % cream Apply 1 Application topically 2 (two) times daily as needed (psoriasis).   Multiple Vitamins-Minerals (CENTRUM SILVER 50+MEN) TABS Take 1 tablet by mouth daily.   polyethylene glycol (MIRALAX / GLYCOLAX) 17 g packet Take 17 g by mouth daily.   triamcinolone (KENALOG) 0.1 % Apply 1 Application topically daily as needed (psoriasis).   alclomethasone (ACLOVATE) 0.05 % cream Apply 1 application  topically daily as needed (psoriasis).   chlorpheniramine-HYDROcodone (TUSSIONEX) 10-8 MG/5ML Take 5 mLs by mouth every 12 (twelve) hours as needed.   No facility-administered encounter medications on file as of 10/31/2022.    Allergies (verified) Tessalon [benzonatate]   History: Past Medical History:  Diagnosis Date   Arthritis    LEFT shoulder   Bowel habit changes 06/2016   on Ketogenic diet/ does not go to the BR but every 3 days   BPH (benign prostatic hyperplasia)    Cataract 2015   bilateral sx   Complication of anesthesia    slow to wake   Diverticulosis of colon 2008   External hemorrhoids    GERD (gastroesophageal reflux disease)    with certain foods   Gilbert's syndrome    Heart murmur    History of anesthesia complications    Per pt, gets very  light-headed past sedation/ fainted 1 time!   Hyperlipidemia    on meds   Hypertension    on meds   Parkinson disease (HCC)    Pneumonia    Pre-diabetes    Skin cancer    X2 in [place]; now seeing Dr Harrie Jeans   Past Surgical History:  Procedure Laterality Date   ASD REPAIR  1961   [place] , [place]- open heart surgery   CATARACT EXTRACTION Bilateral 07/2013   bilateral eyes; Dr Lillia Corporal, [facility]   COLONOSCOPY  2008   [facility]    COLONOSCOPY  2017   JMP-MAC-2 day suprep(good)-polyps   INGUINAL HERNIA REPAIR Left 2006   LUMBAR LAMINECTOMY  1992   [place] , [place]   PACEMAKER IMPLANT N/A 09/05/2022   Procedure: PACEMAKER IMPLANT;  Surgeon: Marinus Maw, MD;  Location: [facility];  Service: Cardiovascular;  Laterality: N/A;   REVERSE SHOULDER ARTHROPLASTY Left 12/29/2021   Procedure: REVERSE SHOULDER ARTHROPLASTY;  Surgeon: Jones Broom, MD;  Location: [facility];  Service: Orthopedics;  Laterality: Left;   TONSILLECTOMY     as child   WISDOM TOOTH EXTRACTION     Family History  Problem Relation Age of Onset   Hypertension Mother    Other Mother        intestine blockage-burst   Emphysema Father        smoker   Cancer Neg Hx    Diabetes Neg Hx    Heart disease Neg Hx    Stroke Neg Hx    Colon cancer Neg Hx    Colon polyps Neg Hx    Stomach cancer Neg Hx    Rectal cancer Neg Hx    Social History   Socioeconomic History   Marital status: Married    Spouse name: Douglas Johnston   Number of children: 0   Years of education: BA   Highest education level: Bachelor's degree (e.g., BA, AB, BS)  Occupational History   Occupation: Retired  Tobacco Use   Smoking status: Never   Smokeless tobacco: Never  Vaping Use   Vaping status: Never Used  Substance and Sexual Activity   Alcohol use: Yes    Comment: Rare   Drug use: No   Sexual activity: Yes  Other Topics Concern   Not on file  Social History Narrative      Caffeine use: coffee daily   Right handed    Lives with wife.   Social Determinants of Health   Financial Resource Strain: Low Risk  (10/30/2022)   Overall Financial Resource Strain (CARDIA)    Difficulty of Paying Living Expenses: Not hard at all  Food Insecurity: No Food Insecurity (10/30/2022)   Hunger Vital Sign    Worried About Running Out of Food in the Last Year: Never true    Ran Out of Food in the Last Year: Never true  Transportation Needs: No Transportation Needs  (10/30/2022)   PRAPARE - Administrator, Civil Service (Medical): No    Lack of Transportation (Non-Medical): No  Physical Activity: Sufficiently Active (10/30/2022)   Exercise Vital Sign    Days of Exercise per Week: 3 days    Minutes of Exercise per Session: 60 min  Stress: No Stress Concern Present (10/30/2022)   [employer] of Occupational Health - Occupational Stress Questionnaire    Feeling of Stress : Only a little  Social Connections: Unknown (10/30/2022)   Social Connection and Isolation Panel [NHANES]    Frequency of  Communication with Friends and Family: Once a week    Frequency of Social Gatherings with Friends and Family: Three times a week    Attends Religious Services: Patient declined    Active Member of Clubs or Organizations: No    Attends Banker Meetings: Never    Marital Status: Married    Tobacco Counseling Counseling given: Not Answered   Clinical Intake:  Pre-visit preparation completed: Yes  Pain : No/denies pain     BMI - recorded: 21.56 Nutritional Status: BMI of 19-24  Normal Nutritional Risks: None Diabetes: No  How often do you need to have someone help you when you read instructions, pamphlets, or other written materials from your doctor or pharmacy?: 1 - Never  Interpreter Needed?: No  Information entered by :: Naveyah Iacovelli, RMA   Activities of Daily Living    10/30/2022    4:59 PM 12/23/2021    9:43 AM  In your present state of health, do you have any difficulty performing the following activities:  Hearing? 1 1  Comment wears hearing aides. Bilateral hearing aids  Vision? 0 0  Difficulty concentrating or making decisions? 0 0  Walking or climbing stairs? 0 0  Dressing or bathing? 0 0  Doing errands, shopping? 0 0  Preparing Food and eating ? N   Using the Toilet? N   In the past six months, have you accidently leaked urine? N   Do you have problems with loss of bowel control? N   Managing  your Medications? N   Managing your Finances? N   Housekeeping or managing your Housekeeping? N     Patient Care Team: Pincus Sanes, MD as PCP - General (Internal Medicine) Wendall Stade, MD as PCP - Cardiology (Cardiology) Holli Humbles, MD as Referring Physician (Ophthalmology)  Indicate any recent Medical Services you may have received from other than [facility] providers in the past year (date may be approximate).     Assessment:   This is a routine wellness examination for Douglas Johnston.  Hearing/Vision screen Hearing Screening - Comments:: Wears hearing aides Vision Screening - Comments:: Wears eyeglasses for reading   Goals Addressed             This Visit's Progress    Client understands the importance of follow-up with providers by attending scheduled visits   On track    My goal is to have left shoulder replacement surgery.      Depression Screen    10/31/2022    9:54 AM 04/14/2022    9:40 AM 04/05/2022    2:09 PM 11/01/2021   10:14 AM 11/01/2021   10:07 AM 02/25/2021   10:09 AM 10/19/2020    2:33 PM  PHQ 2/9 Scores  PHQ - 2 Score 0 0 0 0 0 0 0  PHQ- 9 Score 0 0         Fall Risk    10/30/2022    4:59 PM 09/29/2022    8:08 AM 04/14/2022    9:40 AM 04/05/2022    2:08 PM 11/15/2021    1:06 PM  Fall Risk   Falls in the past year? 0 0 0 0 0  Number falls in past yr: 0 0 0 0 0  Injury with Fall? 0 0 0 0 0  Risk for fall due to :   No Fall Risks No Fall Risks   Follow up Falls evaluation completed;Falls prevention discussed Falls evaluation completed Falls evaluation completed Falls evaluation completed Falls  evaluation completed    MEDICARE RISK AT HOME: Medicare Risk at Home Any stairs in or around the home?: Yes If so, are there any without handrails?: Yes Home free of loose throw rugs in walkways, pet beds, electrical cords, etc?: Yes Adequate lighting in your home to reduce risk of falls?: Yes Life alert?: No Use of a cane, walker or w/c?: No Grab  bars in the bathroom?: No Shower chair or bench in shower?: Yes Elevated toilet seat or a handicapped toilet?: Yes  TIMED UP AND GO:  Was the test performed?  No    Cognitive Function:        10/31/2022    9:51 AM 11/01/2021   10:17 AM  6CIT Screen  What Year? 0 points 0 points  What month? 0 points 0 points  What time? 0 points 0 points  Count back from 20 0 points 0 points  Months in reverse 0 points 0 points  Repeat phrase 0 points 0 points  Total Score 0 points 0 points    Immunizations Immunization History  Administered Date(s) Administered   Fluad Quad(high Dose 65+) 10/10/2018, 10/10/2019, 10/15/2020, 10/06/2021   Influenza, High Dose Seasonal PF 10/16/2012, 10/01/2015, 10/10/2016, 10/30/2017   Influenza,inj,Quad PF,6+ Mos 11/28/2013   Influenza-Unspecified 11/02/2014, 10/06/2021   PFIZER Comirnaty(Gray Top)Covid-19 Tri-Sucrose Vaccine 10/06/2021, 10/06/2021   PFIZER(Purple Top)SARS-COV-2 Vaccination 02/13/2019, 03/11/2019, 10/23/2019, 04/20/2020, 10/04/2020   Pneumococcal Conjugate-13 12/25/2014   Pneumococcal Polysaccharide-23 12/08/2011   Td 02/14/2010   Zoster, Live 01/07/2014    TDAP status: Due, Education has been provided regarding the importance of this vaccine. Advised may receive this vaccine at local pharmacy or Health Dept. Aware to provide a copy of the vaccination record if obtained from local pharmacy or Health Dept. Verbalized acceptance and understanding.  Flu Vaccine status: Due, Education has been provided regarding the importance of this vaccine. Advised may receive this vaccine at local pharmacy or Health Dept. Aware to provide a copy of the vaccination record if obtained from local pharmacy or Health Dept. Verbalized acceptance and understanding.  Pneumococcal vaccine status: Up to date  Covid-19 vaccine status: Information provided on how to obtain vaccines.   Qualifies for Shingles Vaccine? Yes   Zostavax completed Yes   Shingrix  Completed?: No.    Education has been provided regarding the importance of this vaccine. Patient has been advised to call insurance company to determine out of pocket expense if they have not yet received this vaccine. Advised may also receive vaccine at local pharmacy or Health Dept. Verbalized acceptance and understanding.  Screening Tests Health Maintenance  Topic Date Due   Zoster Vaccines- Shingrix (1 of 2) 01/01/1994   DTaP/Tdap/Td (2 - Tdap) 02/15/2020   INFLUENZA VACCINE  08/10/2022   COVID-19 Vaccine (7 - 2023-24 season) 09/10/2022   Medicare Annual Wellness (AWV)  10/31/2023   Pneumonia Vaccine 34+ Years old  Completed   Hepatitis C Screening  Completed   HPV VACCINES  Aged Out   Colonoscopy  Discontinued    Health Maintenance  Health Maintenance Due  Topic Date Due   Zoster Vaccines- Shingrix (1 of 2) 01/01/1994   DTaP/Tdap/Td (2 - Tdap) 02/15/2020   INFLUENZA VACCINE  08/10/2022   COVID-19 Vaccine (7 - 2023-24 season) 09/10/2022    Colorectal cancer screening: No longer required.   Lung Cancer Screening: (Low Dose CT Chest recommended if Age 62-80 years, 20 pack-year currently smoking OR have quit w/in 15years.) does not qualify.   Lung Cancer Screening Referral: N/a  Additional Screening:  Hepatitis C Screening: does qualify; Completed 12/25/2014  Vision Screening: Recommended annual ophthalmology exams for early detection of glaucoma and other disorders of the eye. Is the patient up to date with their annual eye exam?  Yes  Who is the provider or what is the name of the office in which the patient attends annual eye exams? Dr. Valere Dross If pt is not established with a provider, would they like to be referred to a provider to establish care? No .   Dental Screening: Recommended annual dental exams for proper oral hygiene   Community Resource Referral / Chronic Care Management: CRR required this visit?  No   CCM required this visit?  No     Plan:      I have personally reviewed and noted the following in the patient's chart:   Medical and social history Use of alcohol, tobacco or illicit drugs  Current medications and supplements including opioid prescriptions. Patient is not currently taking opioid prescriptions. Functional ability and status Nutritional status Physical activity Advanced directives List of other physicians Hospitalizations, surgeries, and ER visits in previous 12 months Vitals Screenings to include cognitive, depression, and falls Referrals and appointments  In addition, I have reviewed and discussed with patient certain preventive protocols, quality metrics, and best practice recommendations. A written personalized care plan for preventive services as well as general preventive health recommendations were provided to patient.     Sajad Glander L Syair Fricker, CMA   10/31/2022   After Visit Summary: (MyChart) Due to this being a telephonic visit, the after visit summary with patients personalized plan was offered to patient via MyChart   Nurse Notes: Patient is due for Shingrix, Tdap, Flu and Covid vaccines.  He had no other concerns to address today.

## 2022-10-31 NOTE — Patient Instructions (Signed)
[name] , Thank you for taking time to come for your Medicare Wellness Visit. I appreciate your ongoing commitment to your health goals. Please review the following plan we discussed and let me know if I can assist you in the future.   Referrals/Orders/Follow-Ups/Clinician Recommendations: You are due for Flu, Covid, Tetanus and Shingles vaccines.  You can get these done at your local pharmacy.  It was nice talking to you today.  Keep up the good work.  This is a list of the screening recommended for you and due dates:  Health Maintenance  Topic Date Due   Zoster (Shingles) Vaccine (1 of 2) 01/01/1994   DTaP/Tdap/Td vaccine (2 - Tdap) 02/15/2020   Flu Shot  08/10/2022   COVID-19 Vaccine (7 - 2023-24 season) 09/10/2022   Medicare Annual Wellness Visit  10/31/2023   Pneumonia Vaccine  Completed   Hepatitis C Screening  Completed   HPV Vaccine  Aged Out   Colon Cancer Screening  Discontinued    Advanced directives: (Copy Requested) Please bring a copy of your health care power of attorney and living will to the office to be added to your chart at your convenience.  Next Medicare Annual Wellness Visit scheduled for next year: Yes

## 2022-11-02 ENCOUNTER — Other Ambulatory Visit: Payer: Self-pay | Admitting: Neurology

## 2022-11-02 DIAGNOSIS — G20A1 Parkinson's disease without dyskinesia, without mention of fluctuations: Secondary | ICD-10-CM

## 2022-11-07 ENCOUNTER — Encounter: Payer: Self-pay | Admitting: Internal Medicine

## 2022-12-06 ENCOUNTER — Ambulatory Visit: Payer: Medicare Other

## 2022-12-06 DIAGNOSIS — I442 Atrioventricular block, complete: Secondary | ICD-10-CM

## 2022-12-08 LAB — CUP PACEART REMOTE DEVICE CHECK
Battery Remaining Longevity: 153 mo
Battery Voltage: 3.19 V
Brady Statistic AP VP Percent: 23.21 %
Brady Statistic AP VS Percent: 0.01 %
Brady Statistic AS VP Percent: 75.27 %
Brady Statistic AS VS Percent: 1.5 %
Brady Statistic RA Percent Paced: 23.61 %
Brady Statistic RV Percent Paced: 98.49 %
Date Time Interrogation Session: 20241127181839
Implantable Lead Connection Status: 753985
Implantable Lead Connection Status: 753985
Implantable Lead Implant Date: 20240827
Implantable Lead Implant Date: 20240827
Implantable Lead Location: 753859
Implantable Lead Location: 753860
Implantable Lead Model: 3830
Implantable Lead Model: 5076
Implantable Pulse Generator Implant Date: 20240827
Lead Channel Impedance Value: 323 Ohm
Lead Channel Impedance Value: 380 Ohm
Lead Channel Impedance Value: 570 Ohm
Lead Channel Impedance Value: 627 Ohm
Lead Channel Pacing Threshold Amplitude: 0.75 V
Lead Channel Pacing Threshold Amplitude: 1.125 V
Lead Channel Pacing Threshold Pulse Width: 0.4 ms
Lead Channel Pacing Threshold Pulse Width: 0.4 ms
Lead Channel Sensing Intrinsic Amplitude: 26.5 mV
Lead Channel Sensing Intrinsic Amplitude: 26.5 mV
Lead Channel Sensing Intrinsic Amplitude: 3 mV
Lead Channel Sensing Intrinsic Amplitude: 3 mV
Lead Channel Setting Pacing Amplitude: 1.75 V
Lead Channel Setting Pacing Amplitude: 2 V
Lead Channel Setting Pacing Pulse Width: 0.4 ms
Lead Channel Setting Sensing Sensitivity: 1.2 mV
Zone Setting Status: 755011

## 2022-12-12 ENCOUNTER — Encounter: Payer: Self-pay | Admitting: Internal Medicine

## 2022-12-12 ENCOUNTER — Ambulatory Visit: Payer: Medicare Other | Attending: Internal Medicine | Admitting: Internal Medicine

## 2022-12-12 VITALS — BP 168/98 | HR 68 | Ht 72.0 in | Wt 164.4 lb

## 2022-12-12 DIAGNOSIS — I459 Conduction disorder, unspecified: Secondary | ICD-10-CM | POA: Diagnosis not present

## 2022-12-12 DIAGNOSIS — R001 Bradycardia, unspecified: Secondary | ICD-10-CM | POA: Diagnosis not present

## 2022-12-12 LAB — CUP PACEART INCLINIC DEVICE CHECK
Date Time Interrogation Session: 20241203170433
Implantable Lead Connection Status: 753985
Implantable Lead Connection Status: 753985
Implantable Lead Implant Date: 20240827
Implantable Lead Implant Date: 20240827
Implantable Lead Location: 753859
Implantable Lead Location: 753860
Implantable Lead Model: 3830
Implantable Lead Model: 5076
Implantable Pulse Generator Implant Date: 20240827

## 2022-12-12 NOTE — Patient Instructions (Signed)

## 2022-12-12 NOTE — Progress Notes (Signed)
HPI Mr. Baumert returns today for ongoing evaluation of bradycardia. He is a pleasant 77 yo man with Parkinsons who I last saw 6 months ago with a h/o HTN, and remote ASD repair. He also has PVC's. He wore a cardiac monitor demonstrating AVWB and periods of 2:1 AV block, mostly at night. He also has frequent PVC's including bigeminy.   Allergies  Allergen Reactions   Tessalon [Benzonatate] Shortness Of Breath and Cough    Inc cough, wheeze, SOB     Current Outpatient Medications  Medication Sig Dispense Refill   alclomethasone (ACLOVATE) 0.05 % cream Apply 1 application  topically daily as needed (psoriasis).     ALLEGRA ALLERGY 180 MG tablet Take 180 mg by mouth daily as needed for allergies.     bifidobacterium infantis (ALIGN) capsule Take 1 capsule by mouth daily.     carbidopa-levodopa (SINEMET IR) 25-100 MG tablet Take 1&1/2 tablets by mouth 3 (three) times daily. 405 tablet 1   hydrocortisone 2.5 % cream Apply 1 Application topically 2 (two) times daily as needed (psoriasis).     Multiple Vitamins-Minerals (CENTRUM SILVER 50+MEN) TABS Take 1 tablet by mouth daily.     polyethylene glycol (MIRALAX / GLYCOLAX) 17 g packet Take 17 g by mouth daily.     triamcinolone (KENALOG) 0.1 % Apply 1 Application topically daily as needed (psoriasis).     No current facility-administered medications for this visit.     Past Medical History:  Diagnosis Date   Arthritis    LEFT shoulder   Bowel habit changes 06/2016   on Ketogenic diet/ does not go to the BR but every 3 days   BPH (benign prostatic hyperplasia)    Cataract 2015   bilateral sx   Complication of anesthesia    slow to wake   Diverticulosis of colon 2008   External hemorrhoids    GERD (gastroesophageal reflux disease)    with certain foods   Gilbert's syndrome    Heart murmur    History of anesthesia complications    Per pt, gets very light-headed past sedation/ fainted 1 time!   Hyperlipidemia    on meds    Hypertension    on meds   Parkinson disease (HCC)    Pneumonia    Pre-diabetes    Skin cancer    X2 in [place]; now seeing Dr Harrie Jeans    ROS:   All systems reviewed and negative except as noted in the HPI.   Past Surgical History:  Procedure Laterality Date   ASD REPAIR  1961   [place] , [place]- open heart surgery   CATARACT EXTRACTION Bilateral 07/2013   bilateral eyes; Dr Lillia Corporal, [facility]   COLONOSCOPY  2008   [facility]   COLONOSCOPY  2017   JMP-MAC-2 day suprep(good)-polyps   INGUINAL HERNIA REPAIR Left 2006   LUMBAR LAMINECTOMY  1992   [place] , [place]   PACEMAKER IMPLANT N/A 09/05/2022   Procedure: PACEMAKER IMPLANT;  Surgeon: Marinus Maw, MD;  [facility];  Service: Cardiovascular;  Laterality: N/A;   REVERSE SHOULDER ARTHROPLASTY Left 12/29/2021   Procedure: REVERSE SHOULDER ARTHROPLASTY;  Surgeon: Jones Broom, MD;  Location: [facility];  Service: Orthopedics;  Laterality: Left;   TONSILLECTOMY     as child   WISDOM TOOTH EXTRACTION       Family History  Problem Relation Age of Onset   Hypertension Mother    Other Mother  intestine blockage-burst   Emphysema Father        smoker   Cancer Neg Hx    Diabetes Neg Hx    Heart disease Neg Hx    Stroke Neg Hx    Colon cancer Neg Hx    Colon polyps Neg Hx    Stomach cancer Neg Hx    Rectal cancer Neg Hx      Social History   Socioeconomic History   Marital status: Married    Spouse name: Merced Gurrieri   Number of children: 0   Years of education: BA   Highest education level: Bachelor's degree (e.g., BA, AB, BS)  Occupational History   Occupation: Retired  Tobacco Use   Smoking status: Never   Smokeless tobacco: Never  Vaping Use   Vaping status: Never Used  Substance and Sexual Activity   Alcohol use: Yes    Comment: Rare   Drug use: No   Sexual activity: Yes  Other Topics Concern   Not on file  Social History Narrative      Caffeine use: coffee daily    Right handed    Lives with wife.   Social Determinants of Health   Financial Resource Strain: Low Risk  (10/30/2022)   Overall Financial Resource Strain (CARDIA)    Difficulty of Paying Living Expenses: Not hard at all  Food Insecurity: No Food Insecurity (10/30/2022)   Hunger Vital Sign    Worried About Running Out of Food in the Last Year: Never true    Ran Out of Food in the Last Year: Never true  Transportation Needs: No Transportation Needs (10/30/2022)   PRAPARE - Administrator, Civil Service (Medical): No    Lack of Transportation (Non-Medical): No  Physical Activity: Sufficiently Active (10/30/2022)   Exercise Vital Sign    Days of Exercise per Week: 3 days    Minutes of Exercise per Session: 60 min  Stress: No Stress Concern Present (10/30/2022)   [employer] of Occupational Health - Occupational Stress Questionnaire    Feeling of Stress : Only a little  Social Connections: Unknown (10/30/2022)   Social Connection and Isolation Panel [NHANES]    Frequency of Communication with Friends and Family: Once a week    Frequency of Social Gatherings with Friends and Family: Three times a week    Attends Religious Services: Patient declined    Active Member of Clubs or Organizations: No    Attends Banker Meetings: Never    Marital Status: Married  Catering manager Violence: Not At Risk (10/31/2022)   Humiliation, Afraid, Rape, and Kick questionnaire    Fear of Current or Ex-Partner: No    Emotionally Abused: No    Physically Abused: No    Sexually Abused: No     BP (!) 168/98 Comment: recheck  Pulse 68   Ht 6' (1.829 m)   Wt 164 lb 6.4 oz (74.6 kg)   SpO2 98%   BMI 22.30 kg/m   Physical Exam:  Well appearing NAD HEENT: Unremarkable Neck:  No JVD, no thyromegally Lymphatics:  No adenopathy Back:  No CVA tenderness Lungs:  Clear HEART:  Regular rate rhythm, no murmurs, no rubs, no clicks Abd:  soft, positive bowel sounds, no  organomegally, no rebound, no guarding Ext:  2 plus pulses, no edema, no cyanosis, no clubbing Skin:  No rashes no nodules Neuro:  CN II through XII intact, motor grossly intact  EKG - nsr with p synchronous ventricular  pacing  DEVICE  Normal device function.  See PaceArt for details.   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 Parkinson's - he has a modest tremor. I discussed the possibililty of additional medical therapy for any vasodepression.   Sharlot Gowda Glory Graefe,MD

## 2023-01-06 NOTE — Progress Notes (Deleted)
 Cardiology Office Note   Date:  01/06/2023   ID:  Douglas Bee., DOB November 01, 1945, MRN [id number]  PCP:  Pincus Sanes, MD  Cardiologist:   Charlton Haws, MD   No chief complaint on file.      History of Present Illness: Douglas Sheeley. is a 77 y.o. 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 Has been on BP meds for years. Tries to follow low sodium diet. Red wine seems to bring BP down. 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 Ineffective and started on norvasc.  He is active playing racquet ball 6 hours / week   Retired from [employer] from [place]  Married no children     Echo reviewed 07/19/16 EF 60-65% mild LVH Trivial MR/AR moderate LAE RV mildly dilated estimated PA 30 mmHg  Bubble study negative right to left shunt  Monitor 11/26/19 with NSR first degree periods of nocturnal wenkebach and PAF although DR Ladona Ridgel did not think this was afib due to tremor   Myovue 07/13/16 no ischemia or infarct EF estimated 50% normal by echo see above   Seen by primary 02/14/19 and has developed tremor in left hand/arm Norvasc changed to propanolol Tended to run low HR in 50's before this Since has been diagnosed with Parkinson's and started on Sinemet and Primidone  Seen by Dr Annabell Howells 11/11/19 and worried that HR was in 30-40 range Told to stop beta blocker  Has had dizziness and 2:1 AV block on monitor Had Medtronic PPM placed by Dr Ladona Ridgel in August.2024.  TTE 07/05/22 EF 60-65% moderate bi atrial enlargement with mild MR  ***    Past Medical History:  Diagnosis Date   Arthritis    LEFT shoulder   Bowel habit changes 06/2016   on Ketogenic diet/ does not go to the BR but every 3 days   BPH (benign prostatic hyperplasia)    Cataract 2015   bilateral sx   Complication of anesthesia    slow to wake   Diverticulosis of colon 2008   External hemorrhoids     GERD (gastroesophageal reflux disease)    with certain foods   Gilbert's syndrome    Heart murmur    History of anesthesia complications    Per pt, gets very light-headed past sedation/ fainted 1 time!   Hyperlipidemia    on meds   Hypertension    on meds   Parkinson disease (HCC)    Pneumonia    Pre-diabetes    Skin cancer    X2 in [place]; now seeing Dr Harrie Jeans    Past Surgical History:  Procedure Laterality Date   ASD REPAIR  1961   [place] , [place]- open heart surgery   CATARACT EXTRACTION Bilateral 07/2013   bilateral eyes; [place] Lillia Corporal, [facility]   COLONOSCOPY  2008   [facility]   COLONOSCOPY  2017   JMP-MAC-2 day suprep(good)-polyps   INGUINAL HERNIA REPAIR Left 2006   LUMBAR LAMINECTOMY  1992   [place] , [place]   PACEMAKER IMPLANT N/A 09/05/2022   Procedure: PACEMAKER IMPLANT;  Surgeon: Marinus Maw, MD;  [facility];  Service: Cardiovascular;  Laterality: N/A;   REVERSE SHOULDER ARTHROPLASTY Left 12/29/2021   Procedure: REVERSE SHOULDER ARTHROPLASTY;  Surgeon: Jones Broom, MD;  Location: [facility];  Service: Orthopedics;  Laterality: Left;  TONSILLECTOMY     as child   WISDOM TOOTH EXTRACTION       Current Outpatient Medications  Medication Sig Dispense Refill   alclomethasone (ACLOVATE) 0.05 % cream Apply 1 application  topically daily as needed (psoriasis).     ALLEGRA ALLERGY 180 MG tablet Take 180 mg by mouth daily as needed for allergies.     bifidobacterium infantis (ALIGN) capsule Take 1 capsule by mouth daily.     carbidopa-levodopa (SINEMET IR) 25-100 MG tablet Take 1&1/2 tablets by mouth 3 (three) times daily. 405 tablet 1   hydrocortisone 2.5 % cream Apply 1 Application topically 2 (two) times daily as needed (psoriasis).     Multiple Vitamins-Minerals (CENTRUM SILVER 50+MEN) TABS Take 1 tablet by mouth daily.     polyethylene glycol (MIRALAX / GLYCOLAX) 17 g packet Take 17 g by mouth daily.     triamcinolone (KENALOG) 0.1 %  Apply 1 Application topically daily as needed (psoriasis).     No current facility-administered medications for this visit.    Allergies:   Tessalon [benzonatate]    Social History:  The patient  reports that he has never smoked. He has never used smokeless tobacco. He reports current alcohol use. He reports that he does not use drugs.   Family History:  The patient's family history includes Emphysema in his father; Hypertension in his mother; Other in his mother.    ROS:  Please see the history of present illness.   Otherwise, review of systems are positive for none.   All other systems are reviewed and negative.    PHYSICAL EXAM: VS:  There were no vitals taken for this visit. , BMI There is no height or weight on file to calculate BMI. Affect appropriate Healthy:  appears stated age HEENT: normal Neck supple with no adenopathy JVP normal no bruits no thyromegaly Lungs clear with no wheezing and good diaphragmatic motion Heart:  S1/S2 no murmur, no rub, gallop or click PMI normal  PPM under left clavicle  Abdomen: benighn, BS positve, no tenderness, no AAA no bruit.  No HSM or HJR Distal pulses intact with no bruits No edema Neuro resting tremor in left hand and arm  Skin warm and dry No muscular weakness   EKG:   01/06/2023 SR rate 65 RBBB LAD no acute changes    Recent Labs: 04/05/2022: ALT 9; TSH 3.36 08/09/2022: BUN 20; Creatinine, Ser 0.88; Hemoglobin 13.7; Platelets 243; Potassium 4.8; Sodium 140    Lipid Panel    Component Value Date/Time   CHOL 174 04/05/2022 1525   CHOL 186 01/07/2014 1137   TRIG 126.0 04/05/2022 1525   TRIG 64 01/07/2014 1137   HDL 53.10 04/05/2022 1525   HDL 56 01/07/2014 1137   CHOLHDL 3 04/05/2022 1525   VLDL 25.2 04/05/2022 1525   LDLCALC 96 04/05/2022 1525   LDLCALC 117 (H) 01/07/2014 1137      Wt Readings from Last 3 Encounters:  12/12/22 164 lb 6.4 oz (74.6 kg)  10/31/22 159 lb (72.1 kg)  10/03/22 159 lb (72.1 kg)       Other studies Reviewed: Additional studies/ records that were reviewed today include: Notes Dr Lawerance Bach labs and ECG .Echo 07/19/16 Myovue 07/13/16     ASSESSMENT AND PLAN:  1. HTN  continue norvasc avoid beta blockers and other AV nodal suppressers  2. ASD Repair intact by echo 07/07/16 negative bubble study and intact on TTE 07/05/22  3. Varicosities:  F/u Dr Darrick Penna no phlebitis  4.Bradycardia with nocturnal AVWB f/u Dr Ladona Ridgel  Medtronic PPM implant 09/05/22 With Parkinson's may still have issues with vasodepressor response  5. Parkinson's: on  sinemet f/u neurology  Dr Tat continue Sinemet   F/U Dr Ladona Ridgel  F/U with me in a year   Charlton Haws

## 2023-01-15 ENCOUNTER — Ambulatory Visit: Payer: Medicare Other | Admitting: Cardiovascular Disease

## 2023-02-16 ENCOUNTER — Encounter: Payer: Medicare Other | Admitting: Internal Medicine

## 2023-02-27 NOTE — Progress Notes (Signed)
 Cardiology Office Note   Date:  03/07/2023   ID:  Douglas Johnston., DOB [birth year], MRN [id number]  PCP:  Pincus Sanes, MD  Cardiologist:   Charlton Haws, MD   No chief complaint on file.      History of Present Illness: Douglas Johnston. is a 78 y.o. 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 Has been on BP meds for years. Tries to follow low sodium diet. Red wine seems to bring BP down. 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 Ineffective and started on norvasc.  He is active playing racquet ball 6 hours / week   Retired from [employer] from [place]  Married no children     Echo reviewed 07/05/22 EF 60-65%      Monitor 06/04/20 with nocturnal 2:1 AV block average HR 66 bpm and PvCls Seen by Dr Ladona Ridgel and did not feel Symptomatic enough for PPM  Did note at higher risk of syncope with vasodepressor response associated With Parkinson's Has some lightheadedness playing pickle ball and hydration and less keto diet has not helped  He had PPM placed by Dr Ladona Ridgel 09/05/22 moderate bi atrial enlargement with mild MR aortic root 3.9 cm   Thinking of moving to [place] in [place] in the Fall. Wife wants to be near beach      Past Medical History:  Diagnosis Date   Arthritis    LEFT shoulder   Bowel habit changes 06/2016   on Ketogenic diet/ does not go to the BR but every 3 days   BPH (benign prostatic hyperplasia)    Cataract 2015   bilateral sx   Complication of anesthesia    slow to wake   Diverticulosis of colon 2008   External hemorrhoids    GERD (gastroesophageal reflux disease)    with certain foods   Gilbert's syndrome    Heart murmur    History of anesthesia complications    Per pt, gets very light-headed past sedation/ fainted 1 time!   Hyperlipidemia    on meds   Hypertension    on meds   Parkinson disease (HCC)    Pneumonia     Pre-diabetes    Skin cancer    X2 in [place]; now seeing Dr Harrie Jeans    Past Surgical History:  Procedure Laterality Date   ASD REPAIR  1961   [place] , [place]- open heart surgery   CATARACT EXTRACTION Bilateral 07/2013   bilateral eyes; [place] Lillia Corporal, [facility]   COLONOSCOPY  2008   [facility]   COLONOSCOPY  2017   JMP-MAC-2 day suprep(good)-polyps   INGUINAL HERNIA REPAIR Left 2006   LUMBAR LAMINECTOMY  1992   [place] , [place]   PACEMAKER IMPLANT N/A 09/05/2022   Procedure: PACEMAKER IMPLANT;  Surgeon: Marinus Maw, MD;  [facility];  Service: Cardiovascular;  Laterality: N/A;   REVERSE SHOULDER ARTHROPLASTY Left 12/29/2021   Procedure: REVERSE SHOULDER ARTHROPLASTY;  Surgeon: Jones Broom, MD;  Location: [facility];  Service: Orthopedics;  Laterality: Left;   TONSILLECTOMY     as child   WISDOM TOOTH EXTRACTION       Current Outpatient Medications  Medication Sig Dispense Refill   ALLEGRA ALLERGY 180 MG tablet Take 180 mg by mouth daily as needed for allergies.     bifidobacterium infantis (ALIGN)  capsule Take 1 capsule by mouth daily.     carbidopa-levodopa (SINEMET IR) 25-100 MG tablet Take 1&1/2 tablets by mouth 3 (three) times daily. 405 tablet 1   hydrocortisone 2.5 % cream Apply 1 Application topically 2 (two) times daily as needed (psoriasis).     Multiple Vitamins-Minerals (CENTRUM SILVER 50+MEN) TABS Take 1 tablet by mouth daily.     polyethylene glycol (MIRALAX / GLYCOLAX) 17 g packet Take 17 g by mouth daily.     triamcinolone (KENALOG) 0.1 % Apply 1 Application topically daily as needed (psoriasis).     No current facility-administered medications for this visit.    Allergies:   Tessalon [benzonatate]    Social History:  The patient  reports that he has never smoked. He has never used smokeless tobacco. He reports current alcohol use. He reports that he does not use drugs.   Family History:  The patient's family history includes Emphysema  in his father; Hypertension in his mother; Other in his mother.    ROS:  Please see the history of present illness.   Otherwise, review of systems are positive for none.   All other systems are reviewed and negative.    PHYSICAL EXAM: VS:  BP (!) 138/96   Pulse 66   Ht 6' (1.829 m)   Wt 165 lb 6.4 oz (75 kg)   SpO2 98%   BMI 22.43 kg/m  , BMI Body mass index is 22.43 kg/m. Affect appropriate Healthy:  appears stated age HEENT: normal Neck supple with no adenopathy JVP normal no bruits no thyromegaly Lungs clear with no wheezing and good diaphragmatic motion Heart:  S1/S2 no murmur, no rub, gallop or click PMI normal PPM under left clavicle  Abdomen: benighn, BS positve, no tenderness, no AAA no bruit.  No HSM or HJR Distal pulses intact with no bruits No edema Neuro resting tremor in left hand and arm "pill rolling"  Post right reverse total shoulder replacement   EKG:   03/07/2023 SR rate 65 RBBB LAD no acute changes    Recent Labs: 04/05/2022: ALT 9; TSH 3.36 08/09/2022: BUN 20; Creatinine, Ser 0.88; Hemoglobin 13.7; Platelets 243; Potassium 4.8; Sodium 140    Lipid Panel    Component Value Date/Time   CHOL 174 04/05/2022 1525   CHOL 186 01/07/2014 1137   TRIG 126.0 04/05/2022 1525   TRIG 64 01/07/2014 1137   HDL 53.10 04/05/2022 1525   HDL 56 01/07/2014 1137   CHOLHDL 3 04/05/2022 1525   VLDL 25.2 04/05/2022 1525   LDLCALC 96 04/05/2022 1525   LDLCALC 117 (H) 01/07/2014 1137      Wt Readings from Last 3 Encounters:  03/07/23 165 lb 6.4 oz (75 kg)  12/12/22 164 lb 6.4 oz (74.6 kg)  10/31/22 159 lb (72.1 kg)      Other studies Reviewed: Additional studies/ records that were reviewed today include: Notes Dr Lawerance Bach labs and ECG .Echo 07/19/16 Myovue 07/13/16     ASSESSMENT AND PLAN:  1. HTN  continue norvasc avoid beta blockers and other AV nodal suppressers  2. ASD Repair intact by echo 07/07/16 negative bubble study  3. Varicosities:  F/u Dr Darrick Penna no  phlebitis  4.Bradycardia with nocturnal AVWB - now with PPM normal function stil at risk for vasodepressor response with Parkinson's  5. Parkinson's: on  sinemet f/u neurology  Dr Tat continue Sinemet  6. Ortho:  post  left shoulder replacement with Dr Ave Filter Improved ROM   F/U Dr Ladona Ridgel  F/U with me in a year   Charlton Haws

## 2023-03-07 ENCOUNTER — Ambulatory Visit: Payer: Medicare Other | Attending: Cardiovascular Disease | Admitting: Cardiovascular Disease

## 2023-03-07 ENCOUNTER — Encounter: Payer: Self-pay | Admitting: Cardiovascular Disease

## 2023-03-07 ENCOUNTER — Ambulatory Visit (INDEPENDENT_AMBULATORY_CARE_PROVIDER_SITE_OTHER): Payer: Medicare Other

## 2023-03-07 ENCOUNTER — Encounter: Payer: Self-pay | Admitting: Neurology

## 2023-03-07 VITALS — BP 138/96 | HR 66 | Ht 72.0 in | Wt 165.4 lb

## 2023-03-07 DIAGNOSIS — I459 Conduction disorder, unspecified: Secondary | ICD-10-CM | POA: Diagnosis not present

## 2023-03-07 DIAGNOSIS — G20A1 Parkinson's disease without dyskinesia, without mention of fluctuations: Secondary | ICD-10-CM

## 2023-03-07 DIAGNOSIS — Z950 Presence of cardiac pacemaker: Secondary | ICD-10-CM

## 2023-03-07 NOTE — Patient Instructions (Signed)
 Medication Instructions:  Your physician recommends that you continue on your current medications as directed. Please refer to the Current Medication list given to you today.  *If you need a refill on your cardiac medications before your next appointment, please call your pharmacy*  Lab Work: If you have labs (blood work) drawn today and your tests are completely normal, you will receive your results only by: MyChart Message (if you have MyChart) OR A paper copy in the mail If you have any lab test that is abnormal or we need to change your treatment, we will call you to review the results.  Testing/Procedures: None ordered today.  Follow-Up: At [facility], you and your health needs are our priority.  As part of our continuing mission to provide you with exceptional heart care, we have created designated Provider Care Teams.  These Care Teams include your primary Cardiologist (physician) and Advanced Practice Providers (APPs -  Physician Assistants and Nurse Practitioners) who all work together to provide you with the care you need, when you need it.  We recommend signing up for the patient portal called "MyChart".  Sign up information is provided on this After Visit Summary.  MyChart is used to connect with patients for Virtual Visits (Telemedicine).  Patients are able to view lab/test results, encounter notes, upcoming appointments, etc.  Non-urgent messages can be sent to your provider as well.   To learn more about what you can do with MyChart, go to [email or web address].    Your next appointment:   6 month(s)  Provider:   Charlton Haws, MD     Other Instructions    1st Floor: - Lobby - Registration  - Pharmacy  - Lab - Cafe  2nd Floor: - PV Lab - Diagnostic Testing (echo, CT, nuclear med)  3rd Floor: - Vacant  4th Floor: - TCTS (cardiothoracic surgery) - AFib Clinic - Structural Heart Clinic - Vascular Surgery  - Vascular Ultrasound  5th Floor: -  HeartCare Cardiology (general and EP) - Clinical Pharmacy for coumadin, hypertension, lipid, weight-loss medications, and med management appointments    Valet parking services will be available as well.

## 2023-03-08 LAB — CUP PACEART REMOTE DEVICE CHECK
Battery Remaining Longevity: 149 mo
Battery Voltage: 3.16 V
Brady Statistic AP VP Percent: 23.11 %
Brady Statistic AP VS Percent: 0.01 %
Brady Statistic AS VP Percent: 76.48 %
Brady Statistic AS VS Percent: 0.4 %
Brady Statistic RA Percent Paced: 23.12 %
Brady Statistic RV Percent Paced: 99.59 %
Date Time Interrogation Session: 20250225215002
Implantable Lead Connection Status: 753985
Implantable Lead Connection Status: 753985
Implantable Lead Implant Date: 20240827
Implantable Lead Implant Date: 20240827
Implantable Lead Location: 753859
Implantable Lead Location: 753860
Implantable Lead Model: 3830
Implantable Lead Model: 5076
Implantable Pulse Generator Implant Date: 20240827
Lead Channel Impedance Value: 304 Ohm
Lead Channel Impedance Value: 361 Ohm
Lead Channel Impedance Value: 456 Ohm
Lead Channel Impedance Value: 532 Ohm
Lead Channel Pacing Threshold Amplitude: 0.75 V
Lead Channel Pacing Threshold Amplitude: 0.75 V
Lead Channel Pacing Threshold Pulse Width: 0.4 ms
Lead Channel Pacing Threshold Pulse Width: 0.4 ms
Lead Channel Sensing Intrinsic Amplitude: 2.875 mV
Lead Channel Sensing Intrinsic Amplitude: 2.875 mV
Lead Channel Sensing Intrinsic Amplitude: 28.5 mV
Lead Channel Sensing Intrinsic Amplitude: 28.5 mV
Lead Channel Setting Pacing Amplitude: 1.5 V
Lead Channel Setting Pacing Amplitude: 2 V
Lead Channel Setting Pacing Pulse Width: 0.4 ms
Lead Channel Setting Sensing Sensitivity: 1.2 mV
Zone Setting Status: 755011

## 2023-03-08 NOTE — Progress Notes (Unsigned)
 Assessment/Plan:   1.  Parkinsons Disease  -increase carbidopa/levodopa 25/100, 2 tablet 3 times per day.    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  -RX PT [place]   2.  History of melanoma  -Patient follows with dermatology.  -Patient understands that Parkinson's also slightly increases risk for melanoma.  He follows with Dr. Yetta Barre.   3.  REM behavior disorder  -This is commonly associated with PD and the patient is experiencing this.  We discussed that this can be very serious and even harmful.  We talked about medications as well as physical barriers to put in the bed (particularly soft bed rails, pillow barriers).  We talked about moving the night stand so that it is not so close to the side of the bed.  4.  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  5.  "Brain fog"  -don't suspect a neurodegenerative memory impairment but will need to explore possible neurocog testing at [facility]  6.  Dysphagia  -discussed MBE and pt declined  Subjective:   Douglas Johnston. was seen today in follow up for Parkinsons disease.  My previous records were reviewed prior to todays visit as well as outside records available to me.   Pt with wife who supplements hx. he had an appointment with me in about a month, but he needed to cancel it and wanted to be worked in before his appointment at [facility] in April.  He thought he was moving a little bit slower.  He is not having any falls.  He continues to take his medication faithfully.  He is shuffling a little bit more and balance is more troublesome.  Some stutter steps when first gets up.  It somewhat affects his pickle ball.    He c/o fatigue, but  its a physical fatigue like he cannot walk and "brain fog."  Wife mentions choking esp with eating toast.    Current prescribed movement disorder medications: Carbidopa/levodopa 25/100, 1.5 tablet at 7 AM/11 AM/4 PM (increased)   PREVIOUS MEDICATIONS:  propranolol 40 mg 3 times per day (bradycardia); primidone 100 mg twice daily (with Dr. Epimenio Foot); levodopa  ALLERGIES:   Allergies  Allergen Reactions   Tessalon [Benzonatate] Shortness Of Breath and Cough    Inc cough, wheeze, SOB    CURRENT MEDICATIONS:  Outpatient Encounter Medications as of 03/09/2023  Medication Sig   ALLEGRA ALLERGY 180 MG tablet Take 180 mg by mouth daily as needed for allergies.   bifidobacterium infantis (ALIGN) capsule Take 1 capsule by mouth daily.   carbidopa-levodopa (SINEMET IR) 25-100 MG tablet Take 1&1/2 tablets by mouth 3 (three) times daily.   hydrocortisone 2.5 % cream Apply 1 Application topically 2 (two) times daily as needed (psoriasis).   Multiple Vitamins-Minerals (CENTRUM SILVER 50+MEN) TABS Take 1 tablet by mouth daily.   polyethylene glycol (MIRALAX / GLYCOLAX) 17 g packet Take 17 g by mouth daily.   triamcinolone (KENALOG) 0.1 % Apply 1 Application topically daily as needed (psoriasis).   No facility-administered encounter medications on file as of 03/09/2023.    Objective:   PHYSICAL EXAMINATION:    VITALS:   Vitals:   03/09/23 1432  BP: Marland Kitchen)  142/90  Pulse: 77  SpO2: 97%    GEN:  The patient appears stated age and is in NAD. HEENT:  Normocephalic, atraumatic.  The mucous membranes are moist. The superficial temporal arteries are without ropiness or tenderness. CV:  RRR Lungs:  CTAB Neck:  no bruits  Neurological examination:  Orientation: The patient is alert and oriented x3. Cranial nerves: There is good facial symmetry without facial hypomimia. The speech is fluent and clear. Soft palate rises symmetrically and there is no tongue deviation. Hearing is intact to conversational  tone. Sensation: Sensation is intact to light touch throughout Motor: Strength is at least antigravity x4.  Movement examination: Tone: There is nl tone today Abnormal movements: there is mild to mod LUE rest tremor Coordination:  There is mild decremation with finger taps bilaterally  Gait and Station: The patient has no difficulty arising out of a deep-seated chair without the use of the hands. The patient's stride length is slightly decreased, decreased arm swing on the bilaterally with significant bend to the R (? 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)  I have reviewed and interpreted the following labs independently    Chemistry      Component Value Date/Time   NA 140 08/09/2022 1053   K 4.8 08/09/2022 1053   CL 102 08/09/2022 1053   CO2 24 08/09/2022 1053   BUN 20 08/09/2022 1053   CREATININE 0.88 08/09/2022 1053      Component Value Date/Time   CALCIUM 9.4 08/09/2022 1053   ALKPHOS 56 04/05/2022 1525   AST 20 04/05/2022 1525   ALT 9 04/05/2022 1525   BILITOT 0.8 04/05/2022 1525       Lab Results  Component Value Date   WBC 7.2 08/09/2022   HGB 13.7 08/09/2022   HCT 41.5 08/09/2022   MCV 95 08/09/2022   PLT 243 08/09/2022    Lab Results  Component Value Date   TSH 3.36 04/05/2022   Lab Results  Component Value Date   VITAMINB12 587 02/14/2019    Total time spent on today's visit was 32 minutes, including both face-to-face time and nonface-to-face time.  Time included that spent on review of records (prior notes available to me/labs/imaging if pertinent), discussing treatment and goals, answering patient's questions and coordinating care.  Cc:  Pincus Sanes, MD

## 2023-03-09 ENCOUNTER — Ambulatory Visit: Payer: Medicare Other | Admitting: Neurology

## 2023-03-09 VITALS — BP 142/90 | HR 77

## 2023-03-09 DIAGNOSIS — R1319 Other dysphagia: Secondary | ICD-10-CM | POA: Diagnosis not present

## 2023-03-09 DIAGNOSIS — G20A1 Parkinson's disease without dyskinesia, without mention of fluctuations: Secondary | ICD-10-CM

## 2023-03-09 MED ORDER — CARBIDOPA-LEVODOPA ER 50-200 MG PO TBCR: 1.0000 | EXTENDED_RELEASE_TABLET | Freq: Every day | ORAL | 1 refills | Status: AC

## 2023-03-09 MED ORDER — CARBIDOPA-LEVODOPA 25-100 MG PO TABS
2.0000 | ORAL_TABLET | Freq: Three times a day (TID) | ORAL | Status: DC
Start: 2023-03-09 — End: 2023-04-17

## 2023-03-09 NOTE — Patient Instructions (Signed)
 Take carbidopa/levodopa 25/100, 2 at 7am/11am/4pm ADD carbidopa/levodopa 50/200 at bed

## 2023-03-11 ENCOUNTER — Encounter: Payer: Self-pay | Admitting: Internal Medicine

## 2023-04-04 ENCOUNTER — Ambulatory Visit: Payer: Medicare Other | Admitting: Neurology

## 2023-04-10 NOTE — Addendum Note (Signed)
 Addended by: Elease Etienne A on: 04/10/2023 03:17 PM   Modules accepted: Orders

## 2023-04-10 NOTE — Progress Notes (Signed)
 Remote pacemaker transmission.

## 2023-04-17 ENCOUNTER — Other Ambulatory Visit: Payer: Self-pay

## 2023-04-17 ENCOUNTER — Telehealth: Payer: Self-pay | Admitting: Neurology

## 2023-04-17 DIAGNOSIS — G20A1 Parkinson's disease without dyskinesia, without mention of fluctuations: Secondary | ICD-10-CM

## 2023-04-17 MED ORDER — CARBIDOPA-LEVODOPA 25-100 MG PO TABS
2.0000 | ORAL_TABLET | Freq: Three times a day (TID) | ORAL | 0 refills | Status: AC
Start: 2023-04-17 — End: ?

## 2023-04-17 NOTE — Telephone Encounter (Signed)
 Sent RX to [facility] for carbidopa levodopa and called patient to let him know it has been sent

## 2023-04-17 NOTE — Telephone Encounter (Signed)
 Pt called and said the pharmacy told his wife they reached out to Tat about a refill on his carbi/levo 25-100 and that was 3 weeks ago. He has not received a refill. He was last seen 03/09/23.  [facility]

## 2023-04-25 DIAGNOSIS — L812 Freckles: Secondary | ICD-10-CM | POA: Diagnosis not present

## 2023-04-25 DIAGNOSIS — L570 Actinic keratosis: Secondary | ICD-10-CM | POA: Diagnosis not present

## 2023-04-25 DIAGNOSIS — D1801 Hemangioma of skin and subcutaneous tissue: Secondary | ICD-10-CM | POA: Diagnosis not present

## 2023-04-25 DIAGNOSIS — D2262 Melanocytic nevi of left upper limb, including shoulder: Secondary | ICD-10-CM | POA: Diagnosis not present

## 2023-04-25 DIAGNOSIS — D225 Melanocytic nevi of trunk: Secondary | ICD-10-CM | POA: Diagnosis not present

## 2023-04-25 DIAGNOSIS — L218 Other seborrheic dermatitis: Secondary | ICD-10-CM | POA: Diagnosis not present

## 2023-04-25 DIAGNOSIS — L821 Other seborrheic keratosis: Secondary | ICD-10-CM | POA: Diagnosis not present

## 2023-04-25 DIAGNOSIS — Z85828 Personal history of other malignant neoplasm of skin: Secondary | ICD-10-CM | POA: Diagnosis not present

## 2023-04-30 ENCOUNTER — Encounter: Payer: Medicare Other | Admitting: Internal Medicine

## 2023-05-02 ENCOUNTER — Telehealth: Payer: Self-pay | Admitting: Cardiovascular Disease

## 2023-05-02 DIAGNOSIS — G4752 REM sleep behavior disorder: Secondary | ICD-10-CM | POA: Diagnosis not present

## 2023-05-02 DIAGNOSIS — R0602 Shortness of breath: Secondary | ICD-10-CM

## 2023-05-02 DIAGNOSIS — G20A1 Parkinson's disease without dyskinesia, without mention of fluctuations: Secondary | ICD-10-CM | POA: Diagnosis not present

## 2023-05-02 NOTE — Telephone Encounter (Signed)
 Opened note in error.

## 2023-05-02 NOTE — Telephone Encounter (Signed)
 Spoke to patient and he reports that the he has been having increased shortness of breath on exertion, specifically while exercising. He reports that while playing pickle ball he can take 3 steps and becomes very short of breath and has to stop to rest. Pt admits that systolic blood pressure have been running around upper 130's and diastolic in mid 80's. Pt reports that he has a pacemaker and according to his watch while exercising heart rate is reaching 180 bpm (according to his watch) and then he becomes short of breath. Pt reports that he is not on any heart medications at this time.

## 2023-05-02 NOTE — Telephone Encounter (Signed)
  Per MyChart Scheduling message:  Pt c/o Shortness Of Breath: STAT if SOB developed within the last 24 hours or pt is noticeably SOB on the phone  1. Are you currently SOB (can you hear that pt is SOB on the phone)?   2. How long have you been experiencing SOB?   3. Are you SOB when sitting or when up moving around?   4. Are you currently experiencing any other symptoms?   1. Are you currently short of breath? ....   NO   2. How long have you been experiencing shortness of breath?  Douglas Johnston... About 2 months.   3. Are you short of breath when sitting or when up moving around? Douglas Johnston... When moving around, predominantly exercising.   4. Are you currently experiencing any other symptoms? Douglas Johnston... Fatigue, lightheadedness at times

## 2023-05-03 ENCOUNTER — Other Ambulatory Visit: Payer: Self-pay

## 2023-05-03 DIAGNOSIS — R0609 Other forms of dyspnea: Secondary | ICD-10-CM

## 2023-05-03 DIAGNOSIS — R079 Chest pain, unspecified: Secondary | ICD-10-CM

## 2023-05-03 MED ORDER — METOPROLOL SUCCINATE ER 25 MG PO TB24: 25.0000 mg | ORAL_TABLET | Freq: Every day | ORAL | 3 refills | Status: DC

## 2023-05-03 NOTE — Telephone Encounter (Signed)
 Normally about to work outside for several hours and recently only about 5 minutes due to shortness of breath. Denies chest pain, dizziness or lightheadedness.   Remote transmission received, RA paces 13.3%, programmed DDD and histograms appear flat, activity has not changed. Reviewed with Dr Carolynne Citron in office who advised to program rate response on to see if any improvement.   Patient is currently out of town but will return next week. Will make DC apt for pt.

## 2023-05-03 NOTE — Telephone Encounter (Signed)
 Discussed new location of device clinic apt. Given new address to patient.

## 2023-05-03 NOTE — Telephone Encounter (Signed)
 Loyde Rule, MD to Evan Hillock, RN  Me     05/03/23 10:02 AM EF has been normal PPM normal function can add Toprol  25 mg daily and order lexiscan myovue to r/o ischemia  Called patient with recommendations from Dr. Stann Earnest will place order for lexiscan and send message through mychart of instructions.

## 2023-05-06 ENCOUNTER — Encounter: Payer: Self-pay | Admitting: Internal Medicine

## 2023-05-06 NOTE — Progress Notes (Unsigned)
 Subjective:    Patient ID: Douglas Hazel., male    DOB: January 07, 1946, 78 y.o.   MRN: [id number]     HPI Douglas Johnston is here for a physical exam and his chronic medical problems.   Saw Dr Lambert Pillion at [facility].  His medication dose was increased.  That did not seem to help-his wife seeing more improvement than he has.  At home SBP 108-160 ish.  Has follow-up with cardiology because his heart rate has been very elevated and he has been experiencing some shortness of breath.  He has not started taking the metoprolol    Medications and allergies reviewed with patient and updated if appropriate.  Current Outpatient Medications on File Prior to Visit  Medication Sig Dispense Refill   ALLEGRA ALLERGY 180 MG tablet Take 180 mg by mouth daily as needed for allergies.     bifidobacterium infantis (ALIGN) capsule Take 1 capsule by mouth daily.     carbidopa -levodopa  (SINEMET  CR) 50-200 MG tablet Take 1 tablet by mouth at bedtime. 90 tablet 1   carbidopa -levodopa  (SINEMET  IR) 25-100 MG tablet Take 2 tablets by mouth 3 (three) times daily. 7am/11am/4pm 540 tablet 0   hydrocortisone 2.5 % cream Apply 1 Application topically 2 (two) times daily as needed (psoriasis).     metoprolol  succinate (TOPROL  XL) 25 MG 24 hr tablet Take 1 tablet (25 mg total) by mouth daily. 90 tablet 3   Multiple Vitamins-Minerals (CENTRUM SILVER 50+MEN) TABS Take 1 tablet by mouth daily.     polyethylene glycol (MIRALAX  / GLYCOLAX ) 17 g packet Take 17 g by mouth daily.     triamcinolone (KENALOG) 0.1 % Apply 1 Application topically daily as needed (psoriasis).     No current facility-administered medications on file prior to visit.    Review of Systems  Constitutional:  Negative for fever.  Eyes:  Negative for visual disturbance.  Respiratory:  Positive for cough (occ, dry) and shortness of breath (DOE). Negative for wheezing.   Cardiovascular:  Negative for chest pain, palpitations and leg swelling.   Gastrointestinal:  Positive for constipation (miralax  daily). Negative for abdominal pain, blood in stool and diarrhea.       No gerd  Genitourinary:  Negative for difficulty urinating and dysuria.  Musculoskeletal:  Negative for back pain. Arthralgias: left shoulder tightness since surgery. Skin:  Positive for rash (seeing derm).  Neurological:  Positive for light-headedness. Negative for headaches.  Psychiatric/Behavioral:  Negative for dysphoric mood and sleep disturbance. The patient is not nervous/anxious.        Objective:   Vitals:   05/07/23 0838  BP: (!) 152/90  Pulse: 70  Temp: 98.4 F (36.9 C)  SpO2: 95%   Filed Weights   05/07/23 0838  Weight: 163 lb 6 oz (74.1 kg)   Body mass index is 22.16 kg/m.  BP Readings from Last 3 Encounters:  05/07/23 (!) 152/90  03/09/23 (!) 142/90  03/07/23 (!) 138/96    Wt Readings from Last 3 Encounters:  05/07/23 163 lb 6 oz (74.1 kg)  03/07/23 165 lb 6.4 oz (75 kg)  12/12/22 164 lb 6.4 oz (74.6 kg)      Physical Exam Constitutional: He appears well-developed and well-nourished. No distress.  HENT:  Head: Normocephalic and atraumatic.  Right Ear: External ear normal.  Left Ear: External ear normal.  Normal ear canals and TM b/l  Mouth/Throat: Oropharynx is clear and moist. Eyes: Conjunctivae and EOM are normal.  Neck: Neck supple. No tracheal deviation present.  No thyromegaly present.  No carotid bruit  Cardiovascular: Normal rate, regular rhythm, normal heart sounds and intact distal pulses.   No murmur heard.  No lower extremity edema. Pulmonary/Chest: Effort normal and breath sounds normal. No respiratory distress. He has no wheezes. He has no rales.  Abdominal: Soft. He exhibits no distension. There is no tenderness.  Genitourinary: deferred  Lymphadenopathy:   He has no cervical adenopathy.  Skin: Skin is warm and dry. He is not diaphoretic.  Psychiatric: He has a normal mood and affect. His behavior is  normal.         Assessment & Plan:   Physical exam: Screening blood work  ordered Exercise   walking Weight  normal Substance abuse   none   Reviewed recommended immunizations.   Health Maintenance  Topic Date Due   Zoster Vaccines- Shingrix (1 of 2) 01/01/1994   DTaP/Tdap/Td (2 - Tdap) 02/15/2020   COVID-19 Vaccine (7 - 2024-25 season) 05/22/2023 (Originally 09/10/2022)   INFLUENZA VACCINE  08/10/2023   Medicare Annual Wellness (AWV)  10/31/2023   Pneumonia Vaccine 38+ Years old  Completed   Hepatitis C Screening  Completed   HPV VACCINES  Aged Out   Meningococcal B Vaccine  Aged Out   Colonoscopy  Discontinued     See Problem List for Assessment and Plan of chronic medical problems.

## 2023-05-06 NOTE — Patient Instructions (Addendum)
 Blood work was ordered.       Medications changes include :   None      Return in about 1 year (around 05/06/2024) for Physical Exam.    Health Maintenance, Male Adopting a healthy lifestyle and getting preventive care are important in promoting health and wellness. Ask your health care provider about: The right schedule for you to have regular tests and exams. Things you can do on your own to prevent diseases and keep yourself healthy. What should I know about diet, weight, and exercise? Eat a healthy diet  Eat a diet that includes plenty of vegetables, fruits, low-fat dairy products, and lean protein. Do not eat a lot of foods that are high in solid fats, added sugars, or sodium. Maintain a healthy weight Body mass index (BMI) is a measurement that can be used to identify possible weight problems. It estimates body fat based on height and weight. Your health care provider can help determine your BMI and help you achieve or maintain a healthy weight. Get regular exercise Get regular exercise. This is one of the most important things you can do for your health. Most adults should: Exercise for at least 150 minutes each week. The exercise should increase your heart rate and make you sweat (moderate-intensity exercise). Do strengthening exercises at least twice a week. This is in addition to the moderate-intensity exercise. Spend less time sitting. Even light physical activity can be beneficial. Watch cholesterol and blood lipids Have your blood tested for lipids and cholesterol at 80 years of age, then have this test every 5 years. You may need to have your cholesterol levels checked more often if: Your lipid or cholesterol levels are high. You are older than 80 years of age. You are at high risk for heart disease. What should I know about cancer screening? Many types of cancers can be detected early and may often be prevented. Depending on your health history and family  history, you may need to have cancer screening at various ages. This may include screening for: Colorectal cancer. Prostate cancer. Skin cancer. Lung cancer. What should I know about heart disease, diabetes, and high blood pressure? Blood pressure and heart disease High blood pressure causes heart disease and increases the risk of stroke. This is more likely to develop in people who have high blood pressure readings or are overweight. Talk with your health care provider about your target blood pressure readings. Have your blood pressure checked: Every 3-5 years if you are 61-51 years of age. Every year if you are 68 years old or older. If you are between the ages of 40 and 68 and are a current or former smoker, ask your health care provider if you should have a one-time screening for abdominal aortic aneurysm (AAA). Diabetes Have regular diabetes screenings. This checks your fasting blood sugar level. Have the screening done: Once every three years after age 64 if you are at a normal weight and have a low risk for diabetes. More often and at a younger age if you are overweight or have a high risk for diabetes. What should I know about preventing infection? Hepatitis B If you have a higher risk for hepatitis B, you should be screened for this virus. Talk with your health care provider to find out if you are at risk for hepatitis B infection. Hepatitis C Blood testing is recommended for: Everyone born from [birth year] through 1967. Anyone with known risk factors for hepatitis C.  Sexually transmitted infections (STIs) You should be screened each year for STIs, including gonorrhea and chlamydia, if: You are sexually active and are younger than 80 years of age. You are older than 80 years of age and your health care provider tells you that you are at risk for this type of infection. Your sexual activity has changed since you were last screened, and you are at increased risk for chlamydia or  gonorrhea. Ask your health care provider if you are at risk. Ask your health care provider about whether you are at high risk for HIV. Your health care provider may recommend a prescription medicine to help prevent HIV infection. If you choose to take medicine to prevent HIV, you should first get tested for HIV. You should then be tested every 3 months for as long as you are taking the medicine. Follow these instructions at home: Alcohol use Do not drink alcohol if your health care provider tells you not to drink. If you drink alcohol: Limit how much you have to 0-2 drinks a day. Know how much alcohol is in your drink. In the [place] one drink equals one 12 oz bottle of beer (355 mL), one 5 oz glass of wine (148 mL), or one 1 oz glass of hard liquor (44 mL). Lifestyle Do not use any products that contain nicotine or tobacco. These products include cigarettes, chewing tobacco, and vaping devices, such as e-cigarettes. If you need help quitting, ask your health care provider. Do not use street drugs. Do not share needles. Ask your health care provider for help if you need support or information about quitting drugs. General instructions Schedule regular health, dental, and eye exams. Stay current with your vaccines. Tell your health care provider if: You often feel depressed. You have ever been abused or do not feel safe at home. Summary Adopting a healthy lifestyle and getting preventive care are important in promoting health and wellness. Follow your health care provider's instructions about healthy diet, exercising, and getting tested or screened for diseases. Follow your health care provider's instructions on monitoring your cholesterol and blood pressure. This information is not intended to replace advice given to you by your health care provider. Make sure you discuss any questions you have with your health care provider. Document Revised: 05/17/2020 Document Reviewed: 05/17/2020 Elsevier  Patient Education  2024 [employer].

## 2023-05-07 ENCOUNTER — Encounter: Payer: Medicare Other | Admitting: Internal Medicine

## 2023-05-07 ENCOUNTER — Encounter: Payer: Self-pay | Admitting: Internal Medicine

## 2023-05-07 ENCOUNTER — Ambulatory Visit (INDEPENDENT_AMBULATORY_CARE_PROVIDER_SITE_OTHER): Payer: Medicare Other | Admitting: Internal Medicine

## 2023-05-07 VITALS — BP 152/90 | HR 70 | Temp 98.4°F | Ht 72.0 in | Wt 163.4 lb

## 2023-05-07 DIAGNOSIS — G20A1 Parkinson's disease without dyskinesia, without mention of fluctuations: Secondary | ICD-10-CM

## 2023-05-07 DIAGNOSIS — Z0000 Encounter for general adult medical examination without abnormal findings: Secondary | ICD-10-CM | POA: Diagnosis not present

## 2023-05-07 DIAGNOSIS — E7849 Other hyperlipidemia: Secondary | ICD-10-CM

## 2023-05-07 DIAGNOSIS — R739 Hyperglycemia, unspecified: Secondary | ICD-10-CM | POA: Diagnosis not present

## 2023-05-07 LAB — CBC WITH DIFFERENTIAL/PLATELET
Basophils Absolute: 0 10*3/uL (ref 0.0–0.1)
Basophils Relative: 0.5 % (ref 0.0–3.0)
Eosinophils Absolute: 0 10*3/uL (ref 0.0–0.7)
Eosinophils Relative: 0.5 % (ref 0.0–5.0)
HCT: 39.3 % (ref 39.0–52.0)
Hemoglobin: 13.3 g/dL (ref 13.0–17.0)
Lymphocytes Relative: 24.2 % (ref 12.0–46.0)
Lymphs Abs: 1.3 10*3/uL (ref 0.7–4.0)
MCHC: 34 g/dL (ref 30.0–36.0)
MCV: 94.2 fl (ref 78.0–100.0)
Monocytes Absolute: 0.4 10*3/uL (ref 0.1–1.0)
Monocytes Relative: 6.9 % (ref 3.0–12.0)
Neutro Abs: 3.7 10*3/uL (ref 1.4–7.7)
Neutrophils Relative %: 67.9 % (ref 43.0–77.0)
Platelets: 208 10*3/uL (ref 150.0–400.0)
RBC: 4.17 Mil/uL — ABNORMAL LOW (ref 4.22–5.81)
RDW: 13.4 % (ref 11.5–15.5)
WBC: 5.4 10*3/uL (ref 4.0–10.5)

## 2023-05-07 LAB — LIPID PANEL
Cholesterol: 164 mg/dL (ref 0–200)
HDL: 50.1 mg/dL (ref >39.00)
LDL Cholesterol: 93 mg/dL (ref 0–99)
NonHDL: 113.58
Total CHOL/HDL Ratio: 3
Triglycerides: 101 mg/dL (ref 0.0–149.0)
VLDL: 20.2 mg/dL (ref 0.0–40.0)

## 2023-05-07 LAB — TSH: TSH: 3.43 u[IU]/mL (ref 0.35–5.50)

## 2023-05-07 LAB — COMPREHENSIVE METABOLIC PANEL WITH GFR
ALT: 5 U/L (ref 0–53)
AST: 18 U/L (ref 0–37)
Albumin: 4.3 g/dL (ref 3.5–5.2)
Alkaline Phosphatase: 48 U/L (ref 39–117)
BUN: 18 mg/dL (ref 6–23)
CO2: 30 meq/L (ref 19–32)
Calcium: 9.3 mg/dL (ref 8.4–10.5)
Chloride: 103 meq/L (ref 96–112)
Creatinine, Ser: 0.78 mg/dL (ref 0.40–1.50)
GFR: 84.92 mL/min (ref >60.00)
Glucose, Bld: 103 mg/dL — ABNORMAL HIGH (ref 70–99)
Potassium: 4.2 meq/L (ref 3.5–5.1)
Sodium: 139 meq/L (ref 135–145)
Total Bilirubin: 0.7 mg/dL (ref 0.2–1.2)
Total Protein: 7 g/dL (ref 6.0–8.3)

## 2023-05-07 LAB — HEMOGLOBIN A1C: Hgb A1c MFr Bld: 5.9 % (ref 4.6–6.5)

## 2023-05-07 NOTE — Assessment & Plan Note (Addendum)
 Chronic On Sinemet -dose just recently increased Management per Dr. Lambert Pillion at [facility] Stressed regular exercise

## 2023-05-07 NOTE — Assessment & Plan Note (Signed)
 Chronic Lab Results  Component Value Date   HGBA1C 5.8 04/05/2022   Check a1c Low sugar / carb diet Stressed regular exercise

## 2023-05-07 NOTE — Assessment & Plan Note (Signed)
Chronic Check lipid panel, CMP, CBC Continue lifestyle control Continue regular exercise and healthy diet

## 2023-05-08 ENCOUNTER — Encounter: Payer: Medicare Other | Admitting: Internal Medicine

## 2023-05-08 ENCOUNTER — Encounter (HOSPITAL_COMMUNITY): Payer: Self-pay | Admitting: Cardiovascular Disease

## 2023-05-08 ENCOUNTER — Ambulatory Visit: Payer: Medicare Other

## 2023-05-08 ENCOUNTER — Telehealth (HOSPITAL_COMMUNITY): Payer: Self-pay | Admitting: *Deleted

## 2023-05-08 NOTE — Telephone Encounter (Signed)
 Discussed further with Dr. Carolynne Citron.  Advised Pt scheduled for stress test 05/14/2023.  Per Dr. Theotis Flake results of stress test prior to reprogramming ppm.  Outreach made to Pt.  Advised would cancel appt this Thursday and reevaluate based on results of stress test.  Pt indicates understanding and agreement.

## 2023-05-08 NOTE — Telephone Encounter (Signed)
 Left message on voicemail per DPR in reference to upcoming appointment scheduled on 05/14/23 at 7:15 with detailed instructions given per Myocardial Perfusion Study Information Sheet for the test. LM to arrive 15 minutes early, and that it is imperative to arrive on time for appointment to keep from having the test rescheduled. If you need to cancel or reschedule your appointment, please call the office within 24 hours of your appointment. Failure to do so may result in a cancellation of your appointment, and a $50 no show fee. Phone number given for call back for any questions.

## 2023-05-10 ENCOUNTER — Encounter

## 2023-05-11 ENCOUNTER — Other Ambulatory Visit: Payer: Self-pay | Admitting: Cardiovascular Disease

## 2023-05-11 DIAGNOSIS — R0602 Shortness of breath: Secondary | ICD-10-CM

## 2023-05-14 ENCOUNTER — Other Ambulatory Visit: Payer: Self-pay | Admitting: Cardiovascular Disease

## 2023-05-14 ENCOUNTER — Encounter: Payer: Self-pay | Admitting: Cardiovascular Disease

## 2023-05-14 ENCOUNTER — Ambulatory Visit (HOSPITAL_COMMUNITY): Attending: Cardiovascular Disease

## 2023-05-14 DIAGNOSIS — R0602 Shortness of breath: Secondary | ICD-10-CM | POA: Insufficient documentation

## 2023-05-14 LAB — MYOCARDIAL PERFUSION IMAGING
LV dias vol: 124 mL (ref 62–150)
LV sys vol: 53 mL
Nuc Stress EF: 57 %
Peak HR: 75 {beats}/min
Peak HR: 75 {beats}/min
Rest HR: 67 {beats}/min
Rest HR: 67 {beats}/min
Rest Nuclear Isotope Dose: 9.8 mCi
Rest Nuclear Isotope Dose: 9.8 mCi
SDS: 0
SRS: 1
SSS: 0
ST Depression (mm): 0 mm
Stress Nuclear Isotope Dose: 32.9 mCi
Stress Nuclear Isotope Dose: 32.9 mCi
TID: 1.11

## 2023-05-14 MED ORDER — TECHNETIUM TC 99M TETROFOSMIN IV KIT
32.9000 | PACK | Freq: Once | INTRAVENOUS | Status: AC | PRN
  Administered 2023-05-14: 32.9 via INTRAVENOUS

## 2023-05-14 MED ORDER — TECHNETIUM TC 99M TETROFOSMIN IV KIT
9.8000 | PACK | Freq: Once | INTRAVENOUS | Status: AC | PRN
  Administered 2023-05-14: 9.8 via INTRAVENOUS

## 2023-05-14 MED ORDER — REGADENOSON 0.4 MG/5ML IV SOLN
0.4000 mg | Freq: Once | INTRAVENOUS | Status: AC
Start: 2023-05-14 — End: 2023-05-14
  Administered 2023-05-14: 0.4 mg via INTRAVENOUS

## 2023-05-14 MED ORDER — REGADENOSON 0.4 MG/5ML IV SOLN
INTRAVENOUS | Status: AC
  Filled 2023-05-14: qty 5

## 2023-05-23 ENCOUNTER — Ambulatory Visit: Admitting: Podiatry

## 2023-05-23 ENCOUNTER — Ambulatory Visit: Attending: Cardiology

## 2023-05-23 ENCOUNTER — Encounter: Payer: Self-pay | Admitting: Podiatry

## 2023-05-23 DIAGNOSIS — B351 Tinea unguium: Secondary | ICD-10-CM | POA: Diagnosis not present

## 2023-05-23 DIAGNOSIS — R001 Bradycardia, unspecified: Secondary | ICD-10-CM

## 2023-05-23 NOTE — Progress Notes (Signed)
 Pt seen in device clinic to enable rate response.   Pt will continue to monitor and reach out to device clinic if further changes needed.

## 2023-05-23 NOTE — Progress Notes (Signed)
Chief Complaint  Patient presents with   Nail Problem    RM#6 Discoloration of toe nails on left foot.    Subjective: 78 y.o. male presenting today as a reestablish new patient for evaluation of nail dystrophy with thickening and discoloration to the left hallux nail plate.  Onset for about 1 year.  He says this began when he began playing pickle ball.  He presents for further treatment and evaluation  Past Medical History:  Diagnosis Date   Arthritis    LEFT shoulder   Bowel habit changes 06/2016   on Ketogenic diet/ does not go to the BR but every 3 days   BPH (benign prostatic hyperplasia)    Cataract 2015   bilateral sx   Complication of anesthesia    slow to wake   Diverticulosis of colon 2008   External hemorrhoids    GERD (gastroesophageal reflux disease)    with certain foods   Gilbert's syndrome    Heart murmur    History of anesthesia complications    Per pt, gets very light-headed past sedation/ fainted 1 time!   Hyperlipidemia    on meds   Hypertension    on meds   Parkinson disease (HCC)    Pneumonia    Pre-diabetes    Skin cancer    X2 in [place]; now seeing Dr Almarie Arias    Past Surgical History:  Procedure Laterality Date   ASD REPAIR  1961   [place] , [place]- open heart surgery   CATARACT EXTRACTION Bilateral 07/2013   bilateral eyes; Dr Michael Ades, [facility]   COLONOSCOPY  2008   [facility]   COLONOSCOPY  2017   [facility]-2 day suprep(good)-polyps   INGUINAL HERNIA REPAIR Left 2006   LUMBAR LAMINECTOMY  1992   [place] , [place]   PACEMAKER IMPLANT N/A 09/05/2022   Procedure: PACEMAKER IMPLANT;  Surgeon: Tammie Fall, MD;  Location: [facility] INVASIVE CV LAB;  Service: Cardiovascular;  Laterality: N/A;   REVERSE SHOULDER ARTHROPLASTY Left 12/29/2021   Procedure: REVERSE SHOULDER ARTHROPLASTY;  Surgeon: Sammye Cristal, MD;  Location: [facility] ORS;  Service: Orthopedics;  Laterality: Left;   TONSILLECTOMY     as child   WISDOM TOOTH EXTRACTION       Allergies  Allergen Reactions   Tessalon  [Benzonatate ] Shortness Of Breath and Cough    Inc cough, wheeze, SOB    LT great toenail 05/23/2023  Objective: Physical Exam General: The patient is alert and oriented x3 in no acute distress.  Dermatology: Hyperkeratotic dystrophic discolored nail noted to the left hallux nail plate  Vascular: Palpable pedal pulses bilaterally. No edema or erythema noted. Capillary refill within normal limits.  Neurological: Grossly intact via light touch  Musculoskeletal Exam: No pedal deformity noted  Assessment: #1  Nail dystrophy with onychomycosis of toenail left great toe  Plan of Care:  -Patient evaluated -OTC Tolcylen antifungal topical dispensed at checkout -Recommend good supportive shoes that do not irritate or constrict the toebox area -Return to clinic as needed   Dot Gazella, DPM [facility]  Dr. Dot Gazella, DPM    [address] [place], [place] [address]                Office [phone]  Fax [phone])  [phone]     

## 2023-05-23 NOTE — Patient Instructions (Signed)
 Follow up as scheduled.

## 2023-06-06 ENCOUNTER — Ambulatory Visit (INDEPENDENT_AMBULATORY_CARE_PROVIDER_SITE_OTHER): Payer: Medicare Other

## 2023-06-06 DIAGNOSIS — I442 Atrioventricular block, complete: Secondary | ICD-10-CM | POA: Diagnosis not present

## 2023-06-07 LAB — CUP PACEART REMOTE DEVICE CHECK
Battery Remaining Longevity: 146 mo
Battery Voltage: 3.11 V
Brady Statistic AP VP Percent: 26.4 %
Brady Statistic AP VS Percent: 0.03 %
Brady Statistic AS VP Percent: 73.4 %
Brady Statistic AS VS Percent: 0.17 %
Brady Statistic RA Percent Paced: 26.45 %
Brady Statistic RV Percent Paced: 99.8 %
Date Time Interrogation Session: 20250528070845
Implantable Lead Connection Status: 753985
Implantable Lead Connection Status: 753985
Implantable Lead Implant Date: 20240827
Implantable Lead Implant Date: 20240827
Implantable Lead Location: 753859
Implantable Lead Location: 753860
Implantable Lead Model: 3830
Implantable Lead Model: 5076
Implantable Pulse Generator Implant Date: 20240827
Lead Channel Impedance Value: 304 Ohm
Lead Channel Impedance Value: 361 Ohm
Lead Channel Impedance Value: 380 Ohm
Lead Channel Impedance Value: 532 Ohm
Lead Channel Pacing Threshold Amplitude: 0.625 V
Lead Channel Pacing Threshold Amplitude: 0.875 V
Lead Channel Pacing Threshold Pulse Width: 0.4 ms
Lead Channel Pacing Threshold Pulse Width: 0.4 ms
Lead Channel Sensing Intrinsic Amplitude: 14.375 mV
Lead Channel Sensing Intrinsic Amplitude: 14.375 mV
Lead Channel Sensing Intrinsic Amplitude: 2.75 mV
Lead Channel Sensing Intrinsic Amplitude: 2.75 mV
Lead Channel Setting Pacing Amplitude: 1.5 V
Lead Channel Setting Pacing Amplitude: 2 V
Lead Channel Setting Pacing Pulse Width: 0.4 ms
Lead Channel Setting Sensing Sensitivity: 1.2 mV
Zone Setting Status: 755011

## 2023-06-14 ENCOUNTER — Ambulatory Visit: Payer: Self-pay | Admitting: Internal Medicine

## 2023-07-30 NOTE — Addendum Note (Signed)
 Addended by: Zohal Reny A on: 07/30/2023 02:27 PM   Modules accepted: Orders

## 2023-07-30 NOTE — Progress Notes (Signed)
 Remote pacemaker transmission.

## 2023-08-02 DIAGNOSIS — G20A1 Parkinson's disease without dyskinesia, without mention of fluctuations: Secondary | ICD-10-CM | POA: Diagnosis not present

## 2023-08-02 DIAGNOSIS — G4752 REM sleep behavior disorder: Secondary | ICD-10-CM | POA: Diagnosis not present

## 2023-08-24 ENCOUNTER — Encounter: Payer: Self-pay | Admitting: Podiatry

## 2023-09-04 ENCOUNTER — Encounter: Payer: Self-pay | Admitting: Podiatry

## 2023-09-05 ENCOUNTER — Ambulatory Visit (INDEPENDENT_AMBULATORY_CARE_PROVIDER_SITE_OTHER): Payer: Medicare Other

## 2023-09-05 DIAGNOSIS — I442 Atrioventricular block, complete: Secondary | ICD-10-CM | POA: Diagnosis not present

## 2023-09-06 ENCOUNTER — Ambulatory Visit: Payer: Self-pay | Admitting: Internal Medicine

## 2023-09-06 LAB — CUP PACEART REMOTE DEVICE CHECK
Battery Remaining Longevity: 141 mo
Battery Voltage: 3.06 V
Brady Statistic AP VP Percent: 27.11 %
Brady Statistic AP VS Percent: 0.03 %
Brady Statistic AS VP Percent: 72.55 %
Brady Statistic AS VS Percent: 0.31 %
Brady Statistic RA Percent Paced: 27.31 %
Brady Statistic RV Percent Paced: 99.66 %
Date Time Interrogation Session: 20250827070115
Implantable Lead Connection Status: 753985
Implantable Lead Connection Status: 753985
Implantable Lead Implant Date: 20240827
Implantable Lead Implant Date: 20240827
Implantable Lead Location: 753859
Implantable Lead Location: 753860
Implantable Lead Model: 3830
Implantable Lead Model: 5076
Implantable Pulse Generator Implant Date: 20240827
Lead Channel Impedance Value: 304 Ohm
Lead Channel Impedance Value: 342 Ohm
Lead Channel Impedance Value: 380 Ohm
Lead Channel Impedance Value: 513 Ohm
Lead Channel Pacing Threshold Amplitude: 0.625 V
Lead Channel Pacing Threshold Amplitude: 0.75 V
Lead Channel Pacing Threshold Pulse Width: 0.4 ms
Lead Channel Pacing Threshold Pulse Width: 0.4 ms
Lead Channel Sensing Intrinsic Amplitude: 12.875 mV
Lead Channel Sensing Intrinsic Amplitude: 12.875 mV
Lead Channel Sensing Intrinsic Amplitude: 2.5 mV
Lead Channel Sensing Intrinsic Amplitude: 2.5 mV
Lead Channel Setting Pacing Amplitude: 1.5 V
Lead Channel Setting Pacing Amplitude: 2 V
Lead Channel Setting Pacing Pulse Width: 0.4 ms
Lead Channel Setting Sensing Sensitivity: 1.2 mV
Zone Setting Status: 755011

## 2023-09-12 NOTE — Telephone Encounter (Signed)
 Spoke to patient and informed.

## 2023-09-13 NOTE — Telephone Encounter (Signed)
 Left detailed message to purchase in office can call ahead of time to make sure we have it in stock.

## 2023-09-24 NOTE — Progress Notes (Signed)
 Remote PPM Transmission

## 2023-10-16 DIAGNOSIS — D1801 Hemangioma of skin and subcutaneous tissue: Secondary | ICD-10-CM | POA: Diagnosis not present

## 2023-10-16 DIAGNOSIS — L812 Freckles: Secondary | ICD-10-CM | POA: Diagnosis not present

## 2023-10-16 DIAGNOSIS — L821 Other seborrheic keratosis: Secondary | ICD-10-CM | POA: Diagnosis not present

## 2023-10-16 DIAGNOSIS — Z85828 Personal history of other malignant neoplasm of skin: Secondary | ICD-10-CM | POA: Diagnosis not present

## 2023-10-16 DIAGNOSIS — L570 Actinic keratosis: Secondary | ICD-10-CM | POA: Diagnosis not present

## 2023-10-16 DIAGNOSIS — L218 Other seborrheic dermatitis: Secondary | ICD-10-CM | POA: Diagnosis not present

## 2023-11-06 ENCOUNTER — Ambulatory Visit (INDEPENDENT_AMBULATORY_CARE_PROVIDER_SITE_OTHER): Payer: Medicare Other

## 2023-11-06 VITALS — Ht 72.0 in | Wt 163.0 lb

## 2023-11-06 DIAGNOSIS — Z0000 Encounter for general adult medical examination without abnormal findings: Secondary | ICD-10-CM | POA: Diagnosis not present

## 2023-11-06 NOTE — Patient Instructions (Addendum)
 Mr. Hynes,  Thank you for taking the time for your Medicare Wellness Visit. I appreciate your continued commitment to your health goals. Please review the care plan we discussed, and feel free to reach out if I can assist you further.  Medicare recommends these wellness visits once per year to help you and your care team stay ahead of potential health issues. These visits are designed to focus on prevention, allowing your provider to concentrate on managing your acute and chronic conditions during your regular appointments.  Please note that Annual Wellness Visits do not include a physical exam. Some assessments may be limited, especially if the visit was conducted virtually. If needed, we may recommend a separate in-person follow-up with your provider.  Ongoing Care Seeing your primary care provider every 3 to 6 months helps us  monitor your health and provide consistent, personalized care.   Referrals If a referral was made during today's visit and you haven't received any updates within two weeks, please contact the referred provider directly to check on the status.  Recommended Screenings:  Health Maintenance  Topic Date Due   Zoster (Shingles) Vaccine (1 of 2) 01/01/1994   DTaP/Tdap/Td vaccine (2 - Tdap) 02/15/2020   Flu Shot  08/10/2023   COVID-19 Vaccine (7 - 2025-26 season) 09/10/2023   Medicare Annual Wellness Visit  11/05/2024   Pneumococcal Vaccine for age over 75  Completed   Hepatitis C Screening  Completed   Meningitis B Vaccine  Aged Out   Colon Cancer Screening  Discontinued       11/06/2023    9:29 AM  Advanced Directives  Does Patient Have a Medical Advance Directive? Yes  Type of Estate Agent of [place];Living will  Copy of Healthcare Power of Attorney in Chart? No - copy requested   Advance Care Planning is important because it: Ensures you receive medical care that aligns with your values, goals, and preferences. Provides guidance  to your family and loved ones, reducing the emotional burden of decision-making during critical moments.  Vision: Annual vision screenings are recommended for early detection of glaucoma, cataracts, and diabetic retinopathy. These exams can also reveal signs of chronic conditions such as diabetes and high blood pressure.  Dental: Annual dental screenings help detect early signs of oral cancer, gum disease, and other conditions linked to overall health, including heart disease and diabetes.

## 2023-11-06 NOTE — Progress Notes (Signed)
 Subjective:   Douglas Johnston. is a 78 y.o. who presents for a Medicare Wellness preventive visit.  As a reminder, Annual Wellness Visits don't include a physical exam, and some assessments may be limited, especially if this visit is performed virtually. We may recommend an in-person follow-up visit with your provider if needed.  Visit Complete: Virtual I connected with  Douglas Johnston. on 11/06/23 by a audio enabled telemedicine application and verified that I am speaking with the correct person using two identifiers.  Patient Location: Home  Provider Location: Office/Clinic  I discussed the limitations of evaluation and management by telemedicine. The patient expressed understanding and agreed to proceed.  Vital Signs: Because this visit was a virtual/telehealth visit, some criteria may be missing or patient reported. Any vitals not documented were not able to be obtained and vitals that have been documented are patient reported.  VideoDeclined- This patient declined Librarian, academic. Therefore the visit was completed with audio only.  Persons Participating in Visit: Patient.  AWV Questionnaire: Yes: Patient Medicare AWV questionnaire was completed by the patient on 11/02/2023; I have confirmed that all information answered by patient is correct and no changes since this date.  Cardiac Risk Factors include: advanced age (>12men, >64 women);dyslipidemia;male gender     Objective:    Today's Vitals   11/06/23 0929  Weight: 163 lb (73.9 kg)  Height: 6' (1.829 m)   Body mass index is 22.11 kg/m.     11/06/2023    9:29 AM 03/09/2023    2:35 PM 10/31/2022    9:51 AM 09/29/2022    8:08 AM 09/05/2022    5:49 AM 12/23/2021    9:44 AM 11/15/2021    1:06 PM  Advanced Directives  Does Patient Have a Medical Advance Directive? Yes Yes Yes Yes Yes Yes Yes  Type of Estate Agent of [place];Living will Living will  Healthcare Power of [place];Living will Living will Healthcare Power of [place];Living will Healthcare Power of [place];Living will Living will  Copy of Healthcare Power of Attorney in Chart? No - copy requested     No - copy requested     Current Medications (verified) Outpatient Encounter Medications as of 11/06/2023  Medication Sig   ALLEGRA ALLERGY 180 MG tablet Take 180 mg by mouth daily as needed for allergies.   bifidobacterium infantis (ALIGN) capsule Take 1 capsule by mouth daily.   carbidopa -levodopa  (SINEMET  CR) 50-200 MG tablet Take 1 tablet by mouth at bedtime.   carbidopa -levodopa  (SINEMET  IR) 25-100 MG tablet Take 2 tablets by mouth 3 (three) times daily. 7am/11am/4pm   hydrocortisone 2.5 % cream Apply 1 Application topically 2 (two) times daily as needed (psoriasis).   Multiple Vitamins-Minerals (CENTRUM SILVER 50+MEN) TABS Take 1 tablet by mouth daily.   polyethylene glycol (MIRALAX  / GLYCOLAX ) 17 g packet Take 17 g by mouth daily.   triamcinolone (KENALOG) 0.1 % Apply 1 Application topically daily as needed (psoriasis).   [DISCONTINUED] metoprolol  succinate (TOPROL  XL) 25 MG 24 hr tablet Take 1 tablet (25 mg total) by mouth daily.   No facility-administered encounter medications on file as of 11/06/2023.    Allergies (verified) Tessalon  [benzonatate ]   History: Past Medical History:  Diagnosis Date   Arthritis    LEFT shoulder   Bowel habit changes 06/2016   on Ketogenic diet/ does not go to the BR but every 3 days   BPH (benign prostatic hyperplasia)    Cataract 2015  bilateral sx   Complication of anesthesia    slow to wake   Diverticulosis of colon 2008   External hemorrhoids    GERD (gastroesophageal reflux disease)    with certain foods   Gilbert's syndrome    Heart murmur    History of anesthesia complications    Per pt, gets very light-headed past sedation/ fainted 1 time!   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   Procedure: PACEMAKER IMPLANT;  Surgeon: Waddell Danelle ORN, MD;  Location: [facility];  Service: Cardiovascular;  Laterality: N/A;   REVERSE SHOULDER ARTHROPLASTY Left 12/29/2021   Procedure: REVERSE SHOULDER ARTHROPLASTY;  Surgeon: Dozier Soulier, MD;  Location: [facility];  Service: Orthopedics;  Laterality: Left;   TONSILLECTOMY     as child   WISDOM TOOTH EXTRACTION     Family History  Problem Relation Age of Onset   Hypertension Mother    Other Mother        intestine blockage-burst   Emphysema Father        smoker   Cancer Neg Hx    Diabetes Neg Hx    Heart disease Neg Hx    Stroke Neg Hx    Colon cancer Neg Hx    Colon polyps Neg Hx    Stomach cancer Neg Hx    Rectal cancer Neg Hx    Social History   Socioeconomic History   Marital status: Married    Spouse name: Long Brimage   Number of children: 0   Years of education: BA   Highest education level: Bachelor's degree (e.g., BA, AB, BS)  Occupational History   Occupation: Retired  Tobacco Use   Smoking status: Never   Smokeless tobacco: Never  Vaping Use   Vaping status: Never Used  Substance and Sexual Activity   Alcohol use: Yes    Alcohol/week: 1.0 standard drink of alcohol    Types: 1 Glasses of wine per week    Comment: Rare   Drug use: No   Sexual activity: Yes  Other Topics Concern   Not on file  Social History Narrative      Caffeine use: coffee daily   Right handed    Lives with wife.    Social Drivers of Corporate Investment Banker Strain: Low Risk  (11/06/2023)   Overall Financial Resource Strain (CARDIA)    Difficulty of Paying Living Expenses: Not hard at all  Food Insecurity: No Food Insecurity (11/06/2023)   Hunger Vital Sign    Worried About Running Out of Food in the Last Year: Never true    Ran Out of Food in the Last Year: Never true  Transportation Needs: No Transportation Needs (11/06/2023)   PRAPARE - Administrator, Civil Service (Medical): No    Lack of Transportation (Non-Medical):  No  Physical Activity: Sufficiently Active (11/06/2023)   Exercise Vital Sign    Days of Exercise per Week: 3 days    Minutes of Exercise per Session: 150+ min  Stress: No Stress Concern Present (11/06/2023)   [employer] of Occupational Health - Occupational Stress Questionnaire    Feeling of Stress: Only a little  Social Connections: Moderately Integrated (11/06/2023)   Social Connection and Isolation Panel    Frequency of Communication with Friends and Family: More than three times a week    Frequency of Social Gatherings with Friends and Family: Twice a week    Attends Religious Services: Never    Database Administrator or Organizations: Yes    Attends Engineer, Structural: 1 to 4 times per year    Marital Status: Married    Tobacco Counseling Counseling given: Not Answered    Clinical Intake:  Pre-visit preparation completed: Yes  Pain : No/denies pain     BMI - recorded: 22.11 Nutritional Status: BMI of 19-24  Normal Nutritional Risks: None Diabetes: No  Lab Results  Component Value Date   HGBA1C 5.9 05/07/2023   HGBA1C 5.8 04/05/2022   HGBA1C 5.5 12/23/2021     How often do you need to have someone help you when you read instructions, pamphlets, or other written materials from your doctor or pharmacy?: 1 - Never  Interpreter Needed?: No  Information entered by :: Verdie Saba, CMA   Activities of Daily  Living     11/06/2023    9:32 AM 11/02/2023   10:04 AM  In your present state of health, do you have any difficulty performing the following activities:  Hearing? 0 0  Comment wears hearing aids   Vision? 0 0  Difficulty concentrating or making decisions? 0 0  Walking or climbing stairs? 0 0  Dressing or bathing? 0 0  Doing errands, shopping? 0 0  Preparing Food and eating ? N N  Using the Toilet? N N  In the past six months, have you accidently leaked urine? N N  Do you have problems with loss of bowel control? N N  Managing your Medications? N N  Managing your Finances? N N  Housekeeping or managing your Housekeeping? N N    Patient Care Team: Geofm Glade PARAS, MD as PCP - General (Internal Medicine) Delford Maude BROCKS, MD as PCP - Cardiology (Cardiology) Caresse Cough, MD as Referring Physician (Ophthalmology)  I have updated your Care Teams any recent Medical Services you may have received from other providers in the past year.     Assessment:   This is a routine wellness examination for [place].  Hearing/Vision screen Hearing Screening - Comments:: Wears hearing aids Vision Screening - Comments:: Wears eyeglasses for reading - up to date with routine eye exams with Cough Caresse   Goals Addressed               This Visit's Progress     Patient Stated (pt-stated)        Patient stated he plans to continue doing the Keto diet and play pickle ball TIW       Depression Screen     11/06/2023    9:33 AM 05/07/2023    8:47 AM 10/31/2022    9:54 AM 04/14/2022    9:40 AM 04/05/2022    2:09 PM 11/01/2021   10:14 AM 11/01/2021   10:07 AM  PHQ 2/9 Scores  PHQ - 2 Score 0 0 0 0 0  0 0  PHQ- 9 Score 0 2 0 0       Fall Risk     11/06/2023    9:32 AM 11/02/2023   10:04 AM 05/07/2023    8:46 AM 03/09/2023    2:34 PM 10/30/2022    4:59 PM  Fall Risk   Falls in the past year? 0 0 0 0 0  Number falls in past yr: 0 0 0 0 0  Injury with Fall? 0 0 0 0 0  Risk  for fall due to : No Fall Risks  No Fall Risks    Follow up Falls evaluation completed;Falls prevention discussed  Falls evaluation completed Falls evaluation completed Falls evaluation completed;Falls prevention discussed    MEDICARE RISK AT HOME:  Medicare Risk at Home Any stairs in or around the home?: Yes If so, are there any without handrails?: No Home free of loose throw rugs in walkways, pet beds, electrical cords, etc?: Yes Adequate lighting in your home to reduce risk of falls?: Yes Life alert?: No Use of a cane, walker or w/c?: No Grab bars in the bathroom?: Yes Shower chair or bench in shower?: Yes Elevated toilet seat or a handicapped toilet?: No  TIMED UP AND GO:  Was the test performed?  No  Cognitive Function: 6CIT completed        11/06/2023    9:35 AM 10/31/2022    9:51 AM 11/01/2021   10:17 AM  6CIT Screen  What Year? 0 points 0 points 0 points  What month? 0 points 0 points 0 points  What time? 0 points 0 points 0 points  Count back from 20 0 points 0 points 0 points  Months in reverse 0 points 0 points 0 points  Repeat phrase 0 points 0 points 0 points  Total Score 0 points 0 points 0 points    Immunizations Immunization History  Administered Date(s) Administered   Fluad Quad(high Dose 65+) 10/10/2018, 10/10/2019, 10/15/2020, 10/06/2021   INFLUENZA, HIGH DOSE SEASONAL PF 10/16/2012, 10/01/2015, 10/10/2016, 10/30/2017   Influenza,inj,Quad PF,6+ Mos 11/28/2013   Influenza-Unspecified 11/02/2014, 10/06/2021   PFIZER Comirnaty(Gray Top)Covid-19 Tri-Sucrose Vaccine 10/06/2021, 10/06/2021   PFIZER(Purple Top)SARS-COV-2 Vaccination 02/13/2019, 03/11/2019, 10/23/2019, 04/20/2020, 10/04/2020   Pneumococcal Conjugate-13 12/25/2014   Pneumococcal Polysaccharide-23 12/08/2011   Td 02/14/2010   Zoster, Live 01/07/2014    Screening Tests Health Maintenance  Topic Date Due   Zoster Vaccines- Shingrix (1 of 2) 01/01/1994   DTaP/Tdap/Td (2 - Tdap)  02/15/2020   Influenza Vaccine  08/10/2023   COVID-19 Vaccine (7 - 2025-26 season) 09/10/2023   Medicare Annual Wellness (AWV)  11/05/2024   Pneumococcal Vaccine: 50+ Years  Completed   Hepatitis C Screening  Completed   Meningococcal B Vaccine  Aged Out   Colonoscopy  Discontinued    Health Maintenance Items Addressed:  11/06/2023  Additional Screening:  Vision Screening: Recommended annual ophthalmology exams for early detection of glaucoma and other disorders of the eye. Is the patient up to date with their annual eye exam?  Yes  Who is the provider or what is the name of the office in which the patient attends annual eye exams? [facility]  Dental Screening: Recommended annual dental exams for proper oral hygiene  Community Resource Referral / Chronic Care Management: CRR required this visit?  No   CCM required this visit?  No   Plan:    I have personally reviewed and noted the following in the patient's chart:   Medical and social history  Use of alcohol, tobacco or illicit drugs  Current medications and supplements including opioid prescriptions. Patient is not currently taking opioid prescriptions. Functional ability and status Nutritional status Physical activity Advanced directives List of other physicians Hospitalizations, surgeries, and ER visits in previous 12 months Vitals Screenings to include cognitive, depression, and falls Referrals and appointments  In addition, I have reviewed and discussed with patient certain preventive protocols, quality metrics, and best practice recommendations. A written personalized care plan for preventive services as well as general preventive health recommendations were provided to patient.   Verdie CHRISTELLA Saba, CMA   11/06/2023   After Visit Summary: (MyChart) Due to this being a telephonic visit, the after visit summary with patients personalized plan was offered to patient via MyChart   Notes: Scheduled a 1-yr CPE  w/PCP for 05/12/2024.

## 2023-12-05 ENCOUNTER — Ambulatory Visit: Payer: Medicare Other

## 2023-12-05 DIAGNOSIS — I442 Atrioventricular block, complete: Secondary | ICD-10-CM | POA: Diagnosis not present

## 2023-12-07 LAB — CUP PACEART REMOTE DEVICE CHECK
Battery Remaining Longevity: 137 mo
Battery Voltage: 3.03 V
Brady Statistic AP VP Percent: 36.22 %
Brady Statistic AP VS Percent: 0.03 %
Brady Statistic AS VP Percent: 63.69 %
Brady Statistic AS VS Percent: 0.07 %
Brady Statistic RA Percent Paced: 36.2 %
Brady Statistic RV Percent Paced: 99.9 %
Date Time Interrogation Session: 20251127192122
Implantable Lead Connection Status: 753985
Implantable Lead Connection Status: 753985
Implantable Lead Implant Date: 20240827
Implantable Lead Implant Date: 20240827
Implantable Lead Location: 753859
Implantable Lead Location: 753860
Implantable Lead Model: 3830
Implantable Lead Model: 5076
Implantable Pulse Generator Implant Date: 20240827
Lead Channel Impedance Value: 323 Ohm
Lead Channel Impedance Value: 342 Ohm
Lead Channel Impedance Value: 380 Ohm
Lead Channel Impedance Value: 513 Ohm
Lead Channel Pacing Threshold Amplitude: 0.5 V
Lead Channel Pacing Threshold Amplitude: 0.75 V
Lead Channel Pacing Threshold Pulse Width: 0.4 ms
Lead Channel Pacing Threshold Pulse Width: 0.4 ms
Lead Channel Sensing Intrinsic Amplitude: 13.75 mV
Lead Channel Sensing Intrinsic Amplitude: 13.75 mV
Lead Channel Sensing Intrinsic Amplitude: 2.75 mV
Lead Channel Sensing Intrinsic Amplitude: 2.75 mV
Lead Channel Setting Pacing Amplitude: 1.5 V
Lead Channel Setting Pacing Amplitude: 2 V
Lead Channel Setting Pacing Pulse Width: 0.4 ms
Lead Channel Setting Sensing Sensitivity: 1.2 mV
Zone Setting Status: 755011

## 2023-12-10 NOTE — Progress Notes (Signed)
 Remote PPM Transmission

## 2023-12-13 ENCOUNTER — Ambulatory Visit: Payer: Self-pay | Admitting: Internal Medicine

## 2024-01-04 ENCOUNTER — Ambulatory Visit: Payer: Self-pay

## 2024-01-04 NOTE — Telephone Encounter (Signed)
 FYI Only or Action Required?: Action required by provider: update on patient condition.  Patient was last seen in primary care on 05/07/2023 by Geofm Glade PARAS, MD.  Called Nurse Triage reporting Stye.  Symptoms began several days ago.  Interventions attempted: Other: warm compress.  Symptoms are: unchanged.  Triage Disposition: See PCP When Office is Open (Within 3 Days)  Patient/caregiver understands and will follow disposition?: No, wishes to speak with PCP              Copied from CRM #[id number]. Topic: Clinical - Red Word Triage >> Jan 04, 2024  9:53 AM Mia F wrote: Red Word that prompted transfer to Nurse Triage: Red, swollen eye with a little head on it. She believes a stye and says he has not had one in years. He has had it for a couple of days. He has been doing hot compresses but no relief. Reason for Disposition  Longstanding or recurring problems with styes  Answer Assessment - Initial Assessment Questions 1. LOCATION: Which eye has the sty? Upper or lower eyelid?     Left lower eyelid.  2. SIZE: How big is it? (Note: standard pencil eraser is 6 mm)     Larger than a pinpoint but smaller than a pea.  3. EYELID: Is the eyelid swollen? If Yes, ask: How much?     No the only swelling is the sty itself.  4. REDNESS: Has the redness spread onto the eyelid?     Redness, yes. Only to the lower eyelid.  5. ONSET: When did you notice the sty?     Couple days ago.  6. VISION: Do you have blurred vision?      No.  7. PAIN: Is it painful? If Yes, ask: How bad is the pain?  (Scale 1-10; or mild, moderate, severe)     Unsure, she states her husband has not complained of pain but has a high tolerance.  8. CONTACTS: Do you wear contacts?     No.  9. OTHER SYMPTOMS: Do you have any other symptoms? (e.g., fever)     No fever or changes to vision.  Protocols used: Sty-A-AH

## 2024-01-04 NOTE — Telephone Encounter (Signed)
Please advise as MD is out of office

## 2024-01-17 ENCOUNTER — Ambulatory Visit: Admitting: Emergency Medicine

## 2024-01-17 ENCOUNTER — Encounter: Payer: Self-pay | Admitting: Emergency Medicine

## 2024-01-17 VITALS — BP 140/80 | HR 78 | Temp 98.0°F | Ht 72.0 in | Wt 164.0 lb

## 2024-01-17 DIAGNOSIS — H00015 Hordeolum externum left lower eyelid: Secondary | ICD-10-CM | POA: Insufficient documentation

## 2024-01-17 NOTE — Assessment & Plan Note (Signed)
 Clinically stable.  Recommend to continue warm compresses Continue and finish amoxicillin  500 mg 3 times a day for at least 5 days Starting to feel better today. Follow-up with PCP as needed

## 2024-01-17 NOTE — Progress Notes (Signed)
 Douglas Johnston Douglas Johnston. [name]  Patient presents with   Stye    had used meds and compression but didn't work     HISTORY OF PRESENT ILLNESS: Acute problem visit today. This is a 79 y.o. male complaining of stye to left lower eyelid for 2 weeks.  Topical treatment and warm compresses did not work Had dental work yesterday and was started on amoxicillin  500 mg 3 times a day.  Took 1 tablet yesterday and 1 this morning. Starting to feel a little better.  No other associated symptoms. No other complaints or medical concerns today  HPI   Prior to Admission medications  Medication Sig Start Date End Date Taking? Authorizing Provider  ALLEGRA ALLERGY 180 MG tablet Take 180 mg by mouth daily as needed for allergies. 10/14/20  Yes [provider]  bifidobacterium infantis (ALIGN) capsule Take 1 capsule by mouth daily. 10/14/20  Yes [provider]  carbidopa -levodopa  (SINEMET  CR) 50-200 MG tablet Take 1 tablet by mouth at bedtime. 03/09/23  Yes Tat, Asberry RAMAN, DO  carbidopa -levodopa  (SINEMET  IR) 25-100 MG tablet Take 2 tablets by mouth 3 (three) times daily. 7am/11am/4pm 04/17/23  Yes Tat, Asberry RAMAN, DO  hydrocortisone 2.5 % cream Apply 1 Application topically 2 (two) times daily as needed (psoriasis).   Yes [provider]  Multiple Vitamins-Minerals (CENTRUM SILVER 50+MEN) TABS Take 1 tablet by mouth daily. 10/14/20  Yes [provider]  polyethylene glycol (MIRALAX  / GLYCOLAX ) 17 g packet Take 17 g by mouth daily.   Yes [provider]  triamcinolone (KENALOG) 0.1 % Apply 1 Application topically daily as needed (psoriasis). 01/20/20  Yes [provider]    Allergies[1]  Patient Active Problem List   Diagnosis Date Noted   Heart block AV complete (HCC) 06/08/2022   PVC (premature ventricular contraction) 12/27/2021   Parkinson's disease (HCC) 11/24/2019   Bradycardia 05/22/2019   Other fatigue 05/22/2019   Colon  polyps 01/24/2017   Lightheadedness 09/19/2016   Varicose veins 07/21/2015   PCO (posterior capsular opacification), right 08/06/2013   Pseudophakia of both eyes 07/18/2013   Dermatochalasis of both upper eyelids 05/14/2013   Right carotid bruit 12/28/2012   Hyperglycemia 10/16/2012   Nuclear cataract 03/28/2012   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   ELECTROCARDIOGRAM, ABNORMAL 01/26/2009   SKIN CANCER, HX OF 01/26/2009   ATRIAL SEPTAL DEFECT, HX OF 01/26/2009   Hyperlipidemia 08/13/2006   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   Hyperlipidemia    on meds   Hypertension    on meds   Parkinson disease (HCC)    Pneumonia    Pre-diabetes    Skin cancer    X2 in [place]; now seeing Dr Bard Cage    Past Surgical History:  Procedure  Laterality Date   ASD REPAIR  1961   [place] , [place]- open heart surgery   CATARACT EXTRACTION Bilateral 07/2013   bilateral eyes; Dr Quita, [facility]   COLONOSCOPY  2008   [facility]   COLONOSCOPY  2017   J[facility]-MAC-2 day suprep(good)-polyps   INGUINAL HERNIA REPAIR Left 2006   LUMBAR LAMINECTOMY  1992   [place] , [place]   PACEMAKER IMPLANT N/A 09/05/2022   Procedure: PACEMAKER IMPLANT;  Surgeon: Waddell Danelle ORN, MD;  Location: [facility];  Service: Cardiovascular;  Laterality: N/A;   REVERSE SHOULDER ARTHROPLASTY Left 12/29/2021   Procedure: REVERSE SHOULDER  ARTHROPLASTY;  Surgeon: Dozier Soulier, MD;  Location: [facility];  Service: Orthopedics;  Laterality: Left;   TONSILLECTOMY     as child   WISDOM TOOTH EXTRACTION      Social History   Socioeconomic History   Marital status: Married    Spouse name: Douglas Johnston   Number of children: 0   Years of education: BA   Highest education level: Bachelor's degree (e.g., BA, AB, BS)  Occupational History   Occupation: Retired  Tobacco Use   Smoking status: Never   Smokeless tobacco: Never  Vaping Use   Vaping status: Never Used  Substance and Sexual Activity   Alcohol use: Yes    Alcohol/week: 1.0 standard drink of alcohol    Types: 1 Glasses of wine per week    Comment: Rare   Drug use: No   Sexual activity: Yes  Other Topics Concern   Not on file  Social History Narrative      Caffeine use: coffee daily   Right handed    Lives with wife.   Social Drivers of Health   Tobacco Use: Low Risk (01/17/2024)   Patient History    Smoking Tobacco Use: Never    Smokeless Tobacco Use: Never    Passive Exposure: Not on file  Financial Resource Strain: Low Risk (11/06/2023)   Overall Financial Resource Strain (CARDIA)    Difficulty of Paying Living Expenses: Not hard at all  Food Insecurity: No Food Insecurity (11/06/2023)   Epic    Worried About Programme Researcher, Broadcasting/film/video in the Last Year: Never true    Ran Out of Food in the Last Year: Never true  Transportation Needs: No Transportation Needs (11/06/2023)   Epic    Lack of Transportation (Medical): No    Lack of Transportation (Non-Medical): No  Physical Activity: Sufficiently Active (11/06/2023)   Exercise Vital Sign    Days of Exercise per Week: 3 days    Minutes of Exercise per Session: 150+ min  Stress: No Stress Concern Present (11/06/2023)   Douglas Johnston [employer]ational [employer] - Occupational Stress Questionnaire    Feeling of Stress: Only a little  Social Connections: Moderately Integrated (11/06/2023)   Social  Connection and Isolation Panel    Frequency of Communication with Friends and Family: More than three times a week    Frequency of Social Gatherings with Friends and Family: Twice a week    Attends Religious Services: Never    Database Administrator or Organizations: Yes    Attends Banker Meetings: 1 to 4 times per year    Marital Status: Married  Catering Manager Violence: Not At Risk (11/06/2023)   Epic    Fear of Current or Ex-Partner: No    Emotionally Abused: No    Physically Abused: No    Sexually Abused: No  Depression (PHQ2-9): Low Risk (  11/06/2023)   Depression (PHQ2-9)    PHQ-2 Score: 0  Alcohol Screen: Low Risk (11/06/2023)   Alcohol Screen    Last Alcohol Screening Score (AUDIT): 0  Housing: Low Risk (11/06/2023)   Epic    Unable to Pay for Housing in the Last Year: No    Number of Times Moved in the Last Year: 1    Homeless in the Last Year: No  Utilities: Not At Risk (11/06/2023)   Epic    Threatened with loss of utilities: No  Health Literacy: Adequate Health Literacy (11/06/2023)   B1300 Health Literacy    Frequency of need for help with medical instructions: Never    Family History  Problem Relation Age of Onset   Hypertension Mother    Other Mother        intestine blockage-burst   Emphysema Father        smoker   Cancer Neg Hx    Diabetes Neg Hx    Heart disease Neg Hx    Stroke Neg Hx    Colon cancer Neg Hx    Colon polyps Neg Hx    Stomach cancer Neg Hx    Rectal cancer Neg Hx      Review of Systems  Constitutional:  Negative for chills and fever.  Eyes:  Negative for blurred vision, double vision, pain and discharge.  Gastrointestinal:  Negative for nausea and vomiting.  Neurological:  Negative for headaches.    Vitals:   01/17/24 1350 01/17/24 1355  BP: (!) 140/80 (!) 140/80  Pulse: 78   Temp: 98 F (36.7 C)   SpO2: 96%     Physical Exam Constitutional:      Appearance: Normal appearance.  HENT:     Head:  Normocephalic.  Eyes:     Extraocular Movements: Extraocular movements intact.     Pupils: Pupils are equal, round, and reactive to light.     Comments: Small stye to left lower eyelid with eyelid erythema  Cardiovascular:     Rate and Rhythm: Normal rate.  Pulmonary:     Effort: Pulmonary effort is normal.  Skin:    General: Skin is warm and dry.  Neurological:     Mental Status: He is alert and oriented to person, place, and time.  Psychiatric:        Mood and Affect: Mood normal.        Behavior: Behavior normal.      ASSESSMENT & PLAN: Problem List Items Addressed This Visit       Other   Hordeolum externum of left lower eyelid - Primary   Clinically stable.  Recommend to continue warm compresses Continue and finish amoxicillin  500 mg 3 times a day for at least 5 days Starting to feel better today. Follow-up with PCP as needed      Patient Instructions  Stye A stye, also known as a hordeolum, is a bump that forms on an eyelid. It may look like a pimple next to the eyelash. A stye can form inside the eyelid (internal stye) or outside the eyelid (external stye). A stye can cause redness, swelling, and pain on the eyelid. Styes are very common. Anyone can get them at any age. They usually occur in just one eye at a time, but you may have more than one in either eye. What are the causes? A stye is caused by an infection. The infection is almost always caused by bacteria called Staphylococcus aureus. This is a common type  of bacteria that lives on the skin. An internal stye may result from an infected oil-producing gland inside the eyelid. An external stye may be caused by an infection at the base of the eyelash (hair follicle). What increases the risk? You are more likely to develop a stye if: You have had a stye before. You have any of these conditions: Red, itchy, inflamed eyelids (blepharitis). A skin condition such as seborrheic dermatitis or rosacea. High fat levels  in your blood (lipids). Dry eyes. What are the signs or symptoms? The most common symptom of a stye is eyelid pain. Internal styes are more painful than external styes. Other symptoms may include: Painful swelling of your eyelid. A scratchy feeling in your eye. Tearing and redness of your eye. A pimple-like bump on the edge of the eyelid. Pus draining from the stye. How is this diagnosed? Your health care provider may be able to diagnose a stye just by examining your eye. The health care provider may also check to make sure: You do not have a fever or other signs of a more serious infection. The infection has not spread to other parts of your eye or areas around your eye. How is this treated? Most styes will clear up in a few days without treatment or with warm compresses applied to the area. You may need to use antibiotic drops or ointment to treat an infection. Sometimes, steroid drops or ointment are used in addition to antibiotics. In some cases, your health care provider may give you a small steroid injection in the eyelid. If your stye does not heal with routine treatment, your health care provider may drain pus from the stye using a thin blade or needle. This may be done if the stye is large, causing a lot of pain, or affecting your vision. Follow these instructions at home: Take over-the-counter and prescription medicines only as told by your health care provider. This includes eye drops or ointments. If you were prescribed an antibiotic medicine, steroid medicine, or both, apply or use them as told by your health care provider. Do not stop using the medicine even if your condition improves. Apply a warm, wet cloth (warm compress) to your eye for 5-10 minutes, 4 to 6 times a day. Clean the affected eyelid as directed by your health care provider. Do not wear contact lenses or eye makeup until your stye has healed and your health care provider says that it is safe. Do not try to pop or  drain the stye. Do not rub your eye. Contact a health care provider if: You have chills or a fever. Your stye does not go away after several days. Your stye affects your vision. Your eyeball becomes swollen, red, or painful. Get help right away if: You have pain when moving your eye around. Summary A stye is a bump that forms on an eyelid. It may look like a pimple next to the eyelash. A stye can form inside the eyelid (internal stye) or outside the eyelid (external stye). A stye can cause redness, swelling, and pain on the eyelid. Your health care provider may be able to diagnose a stye just by examining your eye. Apply a warm, wet cloth (warm compress) to your eye for 5-10 minutes, 4 to 6 times a day. This information is not intended to replace advice given to you by your health care provider. Make sure you discuss any questions you have with your health care provider. Document Revised: 03/03/2020 Document Reviewed: 03/03/2020  Elsevier Patient Education  2024 Elsevier Inc.    Emil Schaumann, MD [facility]    [1]  Allergies Allergen Reactions   Tessalon  [Benzonatate ] Shortness Of Breath and Cough    Inc cough, wheeze, SOB

## 2024-01-17 NOTE — Patient Instructions (Signed)
 Stye A stye, also known as a hordeolum, is a bump that forms on an eyelid. It may look like a pimple next to the eyelash. A stye can form inside the eyelid (internal stye) or outside the eyelid (external stye). A stye can cause redness, swelling, and pain on the eyelid. Styes are very common. Anyone can get them at any age. They usually occur in just one eye at a time, but you may have more than one in either eye. What are the causes? A stye is caused by an infection. The infection is almost always caused by bacteria called Staphylococcus aureus. This is a common type of bacteria that lives on the skin. An internal stye may result from an infected oil-producing gland inside the eyelid. An external stye may be caused by an infection at the base of the eyelash (hair follicle). What increases the risk? You are more likely to develop a stye if: You have had a stye before. You have any of these conditions: Red, itchy, inflamed eyelids (blepharitis). A skin condition such as seborrheic dermatitis or rosacea. High fat levels in your blood (lipids). Dry eyes. What are the signs or symptoms? The most common symptom of a stye is eyelid pain. Internal styes are more painful than external styes. Other symptoms may include: Painful swelling of your eyelid. A scratchy feeling in your eye. Tearing and redness of your eye. A pimple-like bump on the edge of the eyelid. Pus draining from the stye. How is this diagnosed? Your health care provider may be able to diagnose a stye just by examining your eye. The health care provider may also check to make sure: You do not have a fever or other signs of a more serious infection. The infection has not spread to other parts of your eye or areas around your eye. How is this treated? Most styes will clear up in a few days without treatment or with warm compresses applied to the area. You may need to use antibiotic drops or ointment to treat an infection. Sometimes,  steroid drops or ointment are used in addition to antibiotics. In some cases, your health care provider may give you a small steroid injection in the eyelid. If your stye does not heal with routine treatment, your health care provider may drain pus from the stye using a thin blade or needle. This may be done if the stye is large, causing a lot of pain, or affecting your vision. Follow these instructions at home: Take over-the-counter and prescription medicines only as told by your health care provider. This includes eye drops or ointments. If you were prescribed an antibiotic medicine, steroid medicine, or both, apply or use them as told by your health care provider. Do not stop using the medicine even if your condition improves. Apply a warm, wet cloth (warm compress) to your eye for 5-10 minutes, 4 to 6 times a day. Clean the affected eyelid as directed by your health care provider. Do not wear contact lenses or eye makeup until your stye has healed and your health care provider says that it is safe. Do not try to pop or drain the stye. Do not rub your eye. Contact a health care provider if: You have chills or a fever. Your stye does not go away after several days. Your stye affects your vision. Your eyeball becomes swollen, red, or painful. Get help right away if: You have pain when moving your eye around. Summary A stye is a bump that forms  on an eyelid. It may look like a pimple next to the eyelash. A stye can form inside the eyelid (internal stye) or outside the eyelid (external stye). A stye can cause redness, swelling, and pain on the eyelid. Your health care provider may be able to diagnose a stye just by examining your eye. Apply a warm, wet cloth (warm compress) to your eye for 5-10 minutes, 4 to 6 times a day. This information is not intended to replace advice given to you by your health care provider. Make sure you discuss any questions you have with your health care  provider. Document Revised: 03/03/2020 Document Reviewed: 03/03/2020 Elsevier Patient Education  2024 [employer].

## 2024-01-23 ENCOUNTER — Encounter: Payer: Self-pay | Admitting: Emergency Medicine

## 2024-01-23 NOTE — Telephone Encounter (Signed)
 Please advise

## 2024-01-24 MED ORDER — CEPHALEXIN 500 MG PO CAPS: 500.0000 mg | ORAL_CAPSULE | Freq: Three times a day (TID) | ORAL | 0 refills | Status: AC

## 2024-01-27 ENCOUNTER — Encounter: Payer: Self-pay | Admitting: Cardiovascular Disease

## 2024-02-14 NOTE — Progress Notes (Unsigned)
 "    Cardiology Office Note   Date:  02/14/2024   ID:  Douglas Touhey., DOB 1945-08-02, MRN [id number]  PCP:  Douglas Glade [name], MD  Cardiologist:   Douglas Emmer, MD   No chief complaint on file.      History of Present Illness: Douglas Johnston. is a 79 y.o. 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 Has been on BP meds for years. Tries to follow low sodium diet. Red wine seems to bring BP down. 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 Ineffective and started on norvasc .  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   Moved to [place] in [place] Wife wanted to be by the beach   ***     Past Medical History:  Diagnosis Date   Arthritis    LEFT shoulder   Bowel habit changes 06/2016   on Ketogenic diet/ does not go to the BR but every 3 days   BPH (benign prostatic hyperplasia)    Cataract 2015   bilateral sx   Complication of anesthesia    slow to wake   Diverticulosis of colon 2008   External hemorrhoids    GERD (gastroesophageal reflux disease)    with certain foods   Gilbert's syndrome    Heart murmur    History of anesthesia complications    Per pt, gets very light-headed past sedation/ fainted 1 time!   Hyperlipidemia    on meds   Hypertension    on meds   Parkinson disease  (HCC)    Pneumonia    Pre-diabetes    Skin cancer    X2 in [place]; now seeing Dr Bard Cage    Past Surgical History:  Procedure Laterality Date   ASD REPAIR  1961   [place] , [place]- open heart surgery   CATARACT EXTRACTION Bilateral 07/2013   bilateral eyes; [place] Quita, [facility]   COLONOSCOPY  2008   [facility]   COLONOSCOPY  2017   JMP-MAC-2 day suprep(good)-polyps   INGUINAL HERNIA REPAIR Left 2006   LUMBAR LAMINECTOMY  1992   [place] , [place]   PACEMAKER IMPLANT N/A 09/05/2022   Procedure: PACEMAKER IMPLANT;  Surgeon: Waddell Danelle ORN, MD;  [facility];  Service: Cardiovascular;  Laterality: N/A;   REVERSE SHOULDER ARTHROPLASTY Left 12/29/2021   Procedure: REVERSE SHOULDER ARTHROPLASTY;  Surgeon: Dozier Soulier, MD;  Location: [facility];  Service: Orthopedics;  Laterality: Left;   TONSILLECTOMY     as child   WISDOM TOOTH EXTRACTION       Current Outpatient Medications  Medication Sig Dispense Refill   ALLEGRA ALLERGY 180 MG tablet Take 180 mg by mouth daily as needed for allergies.  bifidobacterium infantis (ALIGN) capsule Take 1 capsule by mouth daily.     carbidopa -levodopa  (SINEMET  CR) 50-200 MG tablet Take 1 tablet by mouth at bedtime. 90 tablet 1   carbidopa -levodopa  (SINEMET  IR) 25-100 MG tablet Take 2 tablets by mouth 3 (three) times daily. 7am/11am/4pm 540 tablet 0   hydrocortisone 2.5 % cream Apply 1 Application topically 2 (two) times daily as needed (psoriasis).     Multiple Vitamins-Minerals (CENTRUM SILVER 50+MEN) TABS Take 1 tablet by mouth daily.     polyethylene glycol (MIRALAX  / GLYCOLAX ) 17 g packet Take 17 g by mouth daily.     triamcinolone (KENALOG) 0.1 % Apply 1 Application topically daily as needed (psoriasis).     No current facility-administered medications for this visit.    Allergies:   Tessalon  [benzonatate ]    Social History:  The patient  reports that he has never smoked. He has never used smokeless tobacco. He reports  current alcohol use of about 1.0 standard drink of alcohol per week. He reports that he does not use drugs.   Family History:  The patient's family history includes Emphysema in his father; Hypertension in his mother; Other in his mother.    ROS:  Please see the history of present illness.   Otherwise, review of systems are positive for none.   All other systems are reviewed and negative.    PHYSICAL EXAM: VS:  There were no vitals taken for this visit. , BMI There is no height or weight on file to calculate BMI. Affect appropriate Healthy:  appears stated age HEENT: normal Neck supple with no adenopathy JVP normal no bruits no thyromegaly Lungs clear with no wheezing and good diaphragmatic motion Heart:  S1/S2 no murmur, no rub, gallop or click PMI normal PPM under left clavicle  Abdomen: benighn, BS positve, no tenderness, no AAA no bruit.  No HSM or HJR Distal pulses intact with no bruits No edema Neuro resting tremor in left hand and arm pill rolling  Post right reverse total shoulder replacement   EKG:   02/14/2024 SR rate 65 RBBB LAD no acute changes    Recent Labs: 05/07/2023: ALT 5; BUN 18; Creatinine, Ser 0.78; Hemoglobin 13.3; Platelets 208.0; Potassium 4.2; Sodium 139; TSH 3.43    Lipid Panel    Component Value Date/Time   CHOL 164 05/07/2023 0954   CHOL 186 01/07/2014 1137   TRIG 101.0 05/07/2023 0954   TRIG 64 01/07/2014 1137   HDL 50.10 05/07/2023 0954   HDL 56 01/07/2014 1137   CHOLHDL 3 05/07/2023 0954   VLDL 20.2 05/07/2023 0954   LDLCALC 93 05/07/2023 0954   LDLCALC 117 (H) 01/07/2014 1137      Wt Readings from Last 3 Encounters:  01/17/24 164 lb (74.4 kg)  11/06/23 163 lb (73.9 kg)  05/07/23 163 lb 6 oz (74.1 kg)      Other studies Reviewed: Additional studies/ records that were reviewed today include: Notes Dr Douglas labs and ECG .Echo 07/19/16 Myovue 07/13/16     ASSESSMENT AND PLAN:  1. HTN  continue norvasc  avoid beta blockers and other  AV nodal suppressers  2. ASD Repair intact by echo 07/07/16 negative bubble study  3. Varicosities:  F/u Dr Harvey no phlebitis  4.Bradycardia with nocturnal AVWB - now with PPM normal function stil at risk for vasodepressor response with Parkinson's  5. Parkinson's: on  sinemet  f/u neurology  Dr Evonnie and [facility]  continue Sinemet  tid and melatonin for REM sleep disturbance  6. Ortho:  post  left shoulder replacement with Dr Dozier Improved ROM   F/U EP ? Dr Doreatha since Dr Waddell retired   F/U with me in a year   Douglas Johnston "

## 2024-02-27 ENCOUNTER — Ambulatory Visit

## 2024-02-27 ENCOUNTER — Ambulatory Visit: Admitting: Cardiovascular Disease

## 2024-05-12 ENCOUNTER — Encounter: Admitting: Internal Medicine

## 2024-05-14 ENCOUNTER — Encounter: Admitting: Internal Medicine

## 2024-11-10 ENCOUNTER — Ambulatory Visit
